# Patient Record
Sex: Male | Born: 1950 | Race: Black or African American | Hispanic: No | Marital: Married | Smoking: Never smoker
Health system: Southern US, Community
[De-identification: ages and names within clinical notes are randomized; demographics above are authoritative.]

## PROBLEM LIST (undated history)

## (undated) DIAGNOSIS — R000 Tachycardia, unspecified: Secondary | ICD-10-CM

## (undated) DIAGNOSIS — M1990 Unspecified osteoarthritis, unspecified site: Secondary | ICD-10-CM

## (undated) DIAGNOSIS — R079 Chest pain, unspecified: Secondary | ICD-10-CM

## (undated) DIAGNOSIS — R2243 Localized swelling, mass and lump, lower limb, bilateral: Secondary | ICD-10-CM

## (undated) DIAGNOSIS — E785 Hyperlipidemia, unspecified: Secondary | ICD-10-CM

## (undated) DIAGNOSIS — E119 Type 2 diabetes mellitus without complications: Secondary | ICD-10-CM

## (undated) DIAGNOSIS — K219 Gastro-esophageal reflux disease without esophagitis: Secondary | ICD-10-CM

## (undated) DIAGNOSIS — J90 Pleural effusion, not elsewhere classified: Secondary | ICD-10-CM

## (undated) DIAGNOSIS — N529 Male erectile dysfunction, unspecified: Secondary | ICD-10-CM

## (undated) DIAGNOSIS — R42 Dizziness and giddiness: Secondary | ICD-10-CM

## (undated) DIAGNOSIS — R0602 Shortness of breath: Secondary | ICD-10-CM

## (undated) DIAGNOSIS — F419 Anxiety disorder, unspecified: Secondary | ICD-10-CM

## (undated) DIAGNOSIS — I517 Cardiomegaly: Secondary | ICD-10-CM

## (undated) DIAGNOSIS — I10 Essential (primary) hypertension: Secondary | ICD-10-CM

## (undated) HISTORY — DX: Localized swelling, mass and lump, lower limb, bilateral: R22.43

## (undated) HISTORY — PX: KNEE SURGERY: SHX244

## (undated) HISTORY — DX: Shortness of breath: R06.02

## (undated) HISTORY — DX: Chest pain, unspecified: R07.9

## (undated) HISTORY — DX: Dizziness and giddiness: R42

## (undated) HISTORY — DX: Male erectile dysfunction, unspecified: N52.9

## (undated) HISTORY — DX: Type 2 diabetes mellitus without complications: E11.9

## (undated) HISTORY — DX: Hyperlipidemia, unspecified: E78.5

## (undated) HISTORY — DX: Tachycardia, unspecified: R00.0

## (undated) HISTORY — DX: Cardiomegaly: I51.7

## (undated) HISTORY — DX: Pleural effusion, not elsewhere classified: J90

---

## 1997-12-01 ENCOUNTER — Ambulatory Visit (HOSPITAL_COMMUNITY): Admission: RE | Admit: 1997-12-01 | Discharge: 1997-12-01 | Payer: Self-pay | Admitting: Gastroenterology

## 1999-05-08 ENCOUNTER — Ambulatory Visit (HOSPITAL_COMMUNITY): Admission: RE | Admit: 1999-05-08 | Discharge: 1999-05-08 | Payer: Self-pay | Admitting: Gastroenterology

## 2000-02-27 ENCOUNTER — Ambulatory Visit (HOSPITAL_COMMUNITY): Admission: RE | Admit: 2000-02-27 | Discharge: 2000-02-27 | Payer: Self-pay | Admitting: General Surgery

## 2000-02-27 ENCOUNTER — Encounter (INDEPENDENT_AMBULATORY_CARE_PROVIDER_SITE_OTHER): Payer: Self-pay

## 2001-02-24 ENCOUNTER — Emergency Department (HOSPITAL_COMMUNITY): Admission: EM | Admit: 2001-02-24 | Discharge: 2001-02-24 | Payer: Self-pay | Admitting: Emergency Medicine

## 2001-02-24 ENCOUNTER — Encounter: Payer: Self-pay | Admitting: Emergency Medicine

## 2001-03-03 ENCOUNTER — Encounter: Payer: Self-pay | Admitting: Family Medicine

## 2001-03-03 ENCOUNTER — Encounter: Admission: RE | Admit: 2001-03-03 | Discharge: 2001-03-03 | Payer: Self-pay | Admitting: Family Medicine

## 2001-06-26 ENCOUNTER — Encounter: Payer: Self-pay | Admitting: Family Medicine

## 2001-06-26 ENCOUNTER — Encounter: Admission: RE | Admit: 2001-06-26 | Discharge: 2001-06-26 | Payer: Self-pay | Admitting: Family Medicine

## 2001-08-08 ENCOUNTER — Encounter: Payer: Self-pay | Admitting: Family Medicine

## 2001-08-08 ENCOUNTER — Encounter: Admission: RE | Admit: 2001-08-08 | Discharge: 2001-08-08 | Payer: Self-pay | Admitting: Family Medicine

## 2002-05-01 ENCOUNTER — Inpatient Hospital Stay (HOSPITAL_COMMUNITY): Admission: EM | Admit: 2002-05-01 | Discharge: 2002-05-02 | Payer: Self-pay | Admitting: Emergency Medicine

## 2002-05-26 ENCOUNTER — Encounter: Admission: RE | Admit: 2002-05-26 | Discharge: 2002-08-24 | Payer: Self-pay | Admitting: Family Medicine

## 2005-11-16 ENCOUNTER — Ambulatory Visit: Payer: Self-pay | Admitting: Family Medicine

## 2006-01-22 ENCOUNTER — Ambulatory Visit: Payer: Self-pay | Admitting: Family Medicine

## 2006-03-11 ENCOUNTER — Ambulatory Visit: Payer: Self-pay | Admitting: Family Medicine

## 2006-03-11 LAB — CONVERTED CEMR LAB
BUN: 21 mg/dL (ref 6–23)
Chol/HDL Ratio, serum: 3.8
Creatinine,U: 168.5 mg/dL
GFR calc non Af Amer: 74 mL/min
Glucose, Bld: 133 mg/dL — ABNORMAL HIGH (ref 70–99)
Hgb A1c MFr Bld: 7.8 % — ABNORMAL HIGH (ref 4.6–6.0)
Microalb Creat Ratio: 49.3 mg/g — ABNORMAL HIGH (ref 0.0–30.0)
Potassium: 4.1 meq/L (ref 3.5–5.1)
VLDL: 21 mg/dL (ref 0–40)

## 2006-06-11 ENCOUNTER — Ambulatory Visit: Payer: Self-pay | Admitting: Family Medicine

## 2006-06-11 LAB — CONVERTED CEMR LAB
CO2: 27 meq/L (ref 19–32)
Creatinine, Ser: 1 mg/dL (ref 0.4–1.5)
GFR calc Af Amer: 100 mL/min
GFR calc non Af Amer: 82 mL/min
Hgb A1c MFr Bld: 7.9 % — ABNORMAL HIGH (ref 4.6–6.0)
Potassium: 4.1 meq/L (ref 3.5–5.1)
Sodium: 140 meq/L (ref 135–145)

## 2006-06-25 ENCOUNTER — Ambulatory Visit: Payer: Self-pay | Admitting: Family Medicine

## 2006-06-25 LAB — CONVERTED CEMR LAB
GFR calc Af Amer: 100 mL/min
GFR calc non Af Amer: 82 mL/min
Glucose, Bld: 131 mg/dL — ABNORMAL HIGH (ref 70–99)
Sodium: 140 meq/L (ref 135–145)

## 2006-07-30 ENCOUNTER — Ambulatory Visit: Payer: Self-pay | Admitting: Family Medicine

## 2006-07-30 LAB — CONVERTED CEMR LAB
BUN: 20 mg/dL (ref 6–23)
Calcium: 9.8 mg/dL (ref 8.4–10.5)
Chloride: 104 meq/L (ref 96–112)
Creatinine, Ser: 1.1 mg/dL (ref 0.4–1.5)
GFR calc non Af Amer: 74 mL/min
Glucose, Bld: 127 mg/dL — ABNORMAL HIGH (ref 70–99)
Sodium: 140 meq/L (ref 135–145)

## 2006-09-12 DIAGNOSIS — Z860100 Personal history of colon polyps, unspecified: Secondary | ICD-10-CM | POA: Insufficient documentation

## 2006-09-12 DIAGNOSIS — K219 Gastro-esophageal reflux disease without esophagitis: Secondary | ICD-10-CM | POA: Insufficient documentation

## 2006-10-07 ENCOUNTER — Ambulatory Visit: Payer: Self-pay | Admitting: Family Medicine

## 2006-10-07 DIAGNOSIS — F528 Other sexual dysfunction not due to a substance or known physiological condition: Secondary | ICD-10-CM

## 2006-10-08 ENCOUNTER — Encounter (INDEPENDENT_AMBULATORY_CARE_PROVIDER_SITE_OTHER): Payer: Self-pay | Admitting: Family Medicine

## 2006-10-08 ENCOUNTER — Telehealth (INDEPENDENT_AMBULATORY_CARE_PROVIDER_SITE_OTHER): Payer: Self-pay | Admitting: *Deleted

## 2006-10-08 LAB — CONVERTED CEMR LAB
AST: 30 units/L (ref 0–37)
BUN: 16 mg/dL (ref 6–23)
CO2: 32 meq/L (ref 19–32)
Chloride: 110 meq/L (ref 96–112)
GFR calc non Af Amer: 74 mL/min
HDL: 31.9 mg/dL — ABNORMAL LOW (ref >39.0)
Microalb Creat Ratio: 26 mg/g (ref 0.0–30.0)
Sodium: 144 meq/L (ref 135–145)
Total CHOL/HDL Ratio: 4
VLDL: 26 mg/dL (ref 0–40)

## 2006-10-11 ENCOUNTER — Encounter (INDEPENDENT_AMBULATORY_CARE_PROVIDER_SITE_OTHER): Payer: Self-pay | Admitting: *Deleted

## 2006-10-29 ENCOUNTER — Telehealth (INDEPENDENT_AMBULATORY_CARE_PROVIDER_SITE_OTHER): Payer: Self-pay | Admitting: *Deleted

## 2006-11-27 ENCOUNTER — Encounter (INDEPENDENT_AMBULATORY_CARE_PROVIDER_SITE_OTHER): Payer: Self-pay | Admitting: Family Medicine

## 2007-01-07 ENCOUNTER — Ambulatory Visit: Payer: Self-pay | Admitting: Family Medicine

## 2007-01-09 ENCOUNTER — Telehealth (INDEPENDENT_AMBULATORY_CARE_PROVIDER_SITE_OTHER): Payer: Self-pay | Admitting: *Deleted

## 2007-01-09 LAB — CONVERTED CEMR LAB
AST: 34 units/L (ref 0–37)
CO2: 29 meq/L (ref 19–32)
Calcium: 9.8 mg/dL (ref 8.4–10.5)
Creatinine, Ser: 1.1 mg/dL (ref 0.4–1.5)
Creatinine,U: 246.3 mg/dL
GFR calc Af Amer: 89 mL/min
GFR calc non Af Amer: 74 mL/min
Hgb A1c MFr Bld: 7.4 % — ABNORMAL HIGH (ref 4.6–6.0)
LDL Cholesterol: 88 mg/dL (ref 0–99)
Sodium: 141 meq/L (ref 135–145)
VLDL: 20 mg/dL (ref 0–40)

## 2007-02-12 ENCOUNTER — Ambulatory Visit: Payer: Self-pay | Admitting: Family Medicine

## 2007-05-05 ENCOUNTER — Ambulatory Visit: Payer: Self-pay | Admitting: Family Medicine

## 2007-05-05 DIAGNOSIS — E785 Hyperlipidemia, unspecified: Secondary | ICD-10-CM | POA: Insufficient documentation

## 2007-05-05 DIAGNOSIS — E119 Type 2 diabetes mellitus without complications: Secondary | ICD-10-CM | POA: Insufficient documentation

## 2007-05-05 DIAGNOSIS — I10 Essential (primary) hypertension: Secondary | ICD-10-CM | POA: Insufficient documentation

## 2007-05-06 ENCOUNTER — Encounter (INDEPENDENT_AMBULATORY_CARE_PROVIDER_SITE_OTHER): Payer: Self-pay | Admitting: *Deleted

## 2007-05-06 ENCOUNTER — Telehealth (INDEPENDENT_AMBULATORY_CARE_PROVIDER_SITE_OTHER): Payer: Self-pay | Admitting: *Deleted

## 2007-05-06 LAB — CONVERTED CEMR LAB
CO2: 28 meq/L (ref 19–32)
Calcium: 9.6 mg/dL (ref 8.4–10.5)
Chloride: 102 meq/L (ref 96–112)
Cholesterol: 132 mg/dL (ref 0–200)
Creatinine, Ser: 1 mg/dL (ref 0.4–1.5)
Creatinine,U: 208.4 mg/dL
GFR calc Af Amer: 99 mL/min
GFR calc non Af Amer: 82 mL/min
HDL: 34.2 mg/dL — ABNORMAL LOW (ref >39.0)
Hgb A1c MFr Bld: 7.2 % — ABNORMAL HIGH (ref 4.6–6.0)
LDL Cholesterol: 76 mg/dL (ref 0–99)
Sodium: 139 meq/L (ref 135–145)
Total CHOL/HDL Ratio: 3.9

## 2007-05-09 ENCOUNTER — Telehealth (INDEPENDENT_AMBULATORY_CARE_PROVIDER_SITE_OTHER): Payer: Self-pay | Admitting: *Deleted

## 2007-06-09 ENCOUNTER — Encounter (INDEPENDENT_AMBULATORY_CARE_PROVIDER_SITE_OTHER): Payer: Self-pay | Admitting: Family Medicine

## 2007-09-01 ENCOUNTER — Ambulatory Visit: Payer: Self-pay | Admitting: Internal Medicine

## 2007-09-05 ENCOUNTER — Telehealth (INDEPENDENT_AMBULATORY_CARE_PROVIDER_SITE_OTHER): Payer: Self-pay | Admitting: *Deleted

## 2007-09-05 LAB — CONVERTED CEMR LAB
Basophils Relative: 0.1 % (ref 0.0–1.0)
Eosinophils Absolute: 0.1 10*3/uL (ref 0.0–0.7)
HCT: 36.5 % — ABNORMAL LOW (ref 39.0–52.0)
Hgb A1c MFr Bld: 7.5 % — ABNORMAL HIGH (ref 4.6–6.0)
Iron: 35 ug/dL — ABNORMAL LOW (ref 42–165)
Monocytes Relative: 6.1 % (ref 3.0–12.0)
Neutro Abs: 4.4 10*3/uL (ref 1.4–7.7)
Neutrophils Relative %: 58.7 % (ref 43.0–77.0)
RBC: 4.6 M/uL (ref 4.22–5.81)
Saturation Ratios: 7.1 % — ABNORMAL LOW (ref 20.0–50.0)
TSH: 0.65 microintl units/mL (ref 0.35–5.50)
Transferrin: 351.8 mg/dL (ref >=212.0)

## 2007-09-19 ENCOUNTER — Telehealth (INDEPENDENT_AMBULATORY_CARE_PROVIDER_SITE_OTHER): Payer: Self-pay | Admitting: *Deleted

## 2007-09-22 ENCOUNTER — Encounter: Payer: Self-pay | Admitting: Internal Medicine

## 2007-10-28 ENCOUNTER — Encounter: Payer: Self-pay | Admitting: Internal Medicine

## 2007-11-03 ENCOUNTER — Encounter: Payer: Self-pay | Admitting: Internal Medicine

## 2007-11-05 ENCOUNTER — Encounter: Payer: Self-pay | Admitting: Internal Medicine

## 2007-11-17 ENCOUNTER — Ambulatory Visit (HOSPITAL_COMMUNITY): Admission: RE | Admit: 2007-11-17 | Discharge: 2007-11-17 | Payer: Self-pay | Admitting: Gastroenterology

## 2007-11-26 ENCOUNTER — Encounter: Payer: Self-pay | Admitting: Internal Medicine

## 2007-12-26 ENCOUNTER — Telehealth (INDEPENDENT_AMBULATORY_CARE_PROVIDER_SITE_OTHER): Payer: Self-pay | Admitting: *Deleted

## 2007-12-30 ENCOUNTER — Telehealth (INDEPENDENT_AMBULATORY_CARE_PROVIDER_SITE_OTHER): Payer: Self-pay | Admitting: *Deleted

## 2008-01-02 ENCOUNTER — Ambulatory Visit: Payer: Self-pay | Admitting: Internal Medicine

## 2008-01-02 DIAGNOSIS — D509 Iron deficiency anemia, unspecified: Secondary | ICD-10-CM

## 2008-01-02 DIAGNOSIS — J45909 Unspecified asthma, uncomplicated: Secondary | ICD-10-CM | POA: Insufficient documentation

## 2008-01-08 ENCOUNTER — Telehealth (INDEPENDENT_AMBULATORY_CARE_PROVIDER_SITE_OTHER): Payer: Self-pay | Admitting: *Deleted

## 2008-01-08 LAB — CONVERTED CEMR LAB
Calcium: 8.9 mg/dL (ref 8.4–10.5)
Glucose, Bld: 107 mg/dL — ABNORMAL HIGH (ref 70–99)
Hgb A1c MFr Bld: 7.9 % — ABNORMAL HIGH (ref 4.6–6.0)
Potassium: 3.7 meq/L (ref 3.5–5.1)

## 2008-01-21 ENCOUNTER — Telehealth: Payer: Self-pay | Admitting: Internal Medicine

## 2008-01-27 ENCOUNTER — Telehealth (INDEPENDENT_AMBULATORY_CARE_PROVIDER_SITE_OTHER): Payer: Self-pay | Admitting: *Deleted

## 2008-01-30 ENCOUNTER — Telehealth (INDEPENDENT_AMBULATORY_CARE_PROVIDER_SITE_OTHER): Payer: Self-pay | Admitting: *Deleted

## 2008-02-02 ENCOUNTER — Telehealth (INDEPENDENT_AMBULATORY_CARE_PROVIDER_SITE_OTHER): Payer: Self-pay | Admitting: *Deleted

## 2008-02-02 ENCOUNTER — Encounter: Admission: RE | Admit: 2008-02-02 | Discharge: 2008-02-02 | Payer: Self-pay | Admitting: Surgery

## 2008-02-11 ENCOUNTER — Encounter: Payer: Self-pay | Admitting: Internal Medicine

## 2008-03-15 ENCOUNTER — Telehealth (INDEPENDENT_AMBULATORY_CARE_PROVIDER_SITE_OTHER): Payer: Self-pay | Admitting: *Deleted

## 2008-04-13 ENCOUNTER — Telehealth: Payer: Self-pay | Admitting: Internal Medicine

## 2008-04-19 ENCOUNTER — Telehealth (INDEPENDENT_AMBULATORY_CARE_PROVIDER_SITE_OTHER): Payer: Self-pay | Admitting: *Deleted

## 2008-05-11 ENCOUNTER — Ambulatory Visit: Payer: Self-pay | Admitting: Internal Medicine

## 2008-05-11 DIAGNOSIS — L989 Disorder of the skin and subcutaneous tissue, unspecified: Secondary | ICD-10-CM | POA: Insufficient documentation

## 2008-05-13 ENCOUNTER — Telehealth (INDEPENDENT_AMBULATORY_CARE_PROVIDER_SITE_OTHER): Payer: Self-pay | Admitting: *Deleted

## 2008-05-14 ENCOUNTER — Telehealth (INDEPENDENT_AMBULATORY_CARE_PROVIDER_SITE_OTHER): Payer: Self-pay | Admitting: *Deleted

## 2008-05-24 ENCOUNTER — Encounter: Payer: Self-pay | Admitting: Internal Medicine

## 2008-06-04 ENCOUNTER — Encounter: Payer: Self-pay | Admitting: Internal Medicine

## 2008-06-14 ENCOUNTER — Encounter: Payer: Self-pay | Admitting: Internal Medicine

## 2008-07-12 ENCOUNTER — Encounter (INDEPENDENT_AMBULATORY_CARE_PROVIDER_SITE_OTHER): Payer: Self-pay | Admitting: *Deleted

## 2008-07-21 ENCOUNTER — Telehealth (INDEPENDENT_AMBULATORY_CARE_PROVIDER_SITE_OTHER): Payer: Self-pay | Admitting: *Deleted

## 2008-11-08 ENCOUNTER — Encounter: Admission: RE | Admit: 2008-11-08 | Discharge: 2009-02-06 | Payer: Self-pay | Admitting: Family Medicine

## 2009-11-14 ENCOUNTER — Observation Stay (HOSPITAL_COMMUNITY): Admission: EM | Admit: 2009-11-14 | Discharge: 2009-11-14 | Payer: Self-pay | Admitting: Emergency Medicine

## 2010-04-30 LAB — CONVERTED CEMR LAB
BUN: 13 mg/dL (ref 6–23)
CO2: 28 meq/L (ref 19–32)
Calcium: 9.5 mg/dL (ref 8.4–10.5)
Cholesterol: 126 mg/dL (ref 0–200)
GFR calc non Af Amer: 92 mL/min
Iron: 67 ug/dL (ref 42–165)
LDL Cholesterol: 69 mg/dL (ref 0–99)
PSA: 1.38 ng/mL (ref 0.10–4.00)
Transferrin: 326.6 mg/dL (ref >=212.0)
Triglycerides: 125 mg/dL (ref 0–149)
VLDL: 25 mg/dL (ref 0–40)

## 2010-06-16 LAB — DIFFERENTIAL
Basophils Absolute: 0 10*3/uL (ref 0.0–0.1)
Neutro Abs: 6.2 10*3/uL (ref 1.7–7.7)

## 2010-06-16 LAB — GLUCOSE, CAPILLARY
Glucose-Capillary: 192 mg/dL — ABNORMAL HIGH (ref 70–99)
Glucose-Capillary: 255 mg/dL — ABNORMAL HIGH (ref 70–99)
Glucose-Capillary: 283 mg/dL — ABNORMAL HIGH (ref 70–99)
Glucose-Capillary: 385 mg/dL — ABNORMAL HIGH (ref 70–99)

## 2010-06-16 LAB — POCT I-STAT 3, VENOUS BLOOD GAS (G3P V)
Acid-base deficit: 5 mmol/L — ABNORMAL HIGH (ref 0.0–2.0)
O2 Saturation: 74 %
TCO2: 21 mmol/L (ref 0–100)
pH, Ven: 7.33 — ABNORMAL HIGH (ref 7.250–7.300)
pO2, Ven: 42 mmHg (ref 30.0–45.0)

## 2010-06-16 LAB — POCT I-STAT, CHEM 8
BUN: 13 mg/dL (ref 6–23)
Chloride: 101 mEq/L (ref 96–112)
Creatinine, Ser: 0.9 mg/dL (ref 0.4–1.5)
Glucose, Bld: 478 mg/dL — ABNORMAL HIGH (ref 70–99)
HCT: 53 % — ABNORMAL HIGH (ref 39.0–52.0)
Hemoglobin: 14.3 g/dL (ref 13.0–17.0)
Hemoglobin: 18 g/dL — ABNORMAL HIGH (ref 13.0–17.0)
Sodium: 132 mEq/L — ABNORMAL LOW (ref 135–145)
TCO2: 20 mmol/L (ref 0–100)
TCO2: 20 mmol/L (ref 0–100)

## 2010-06-16 LAB — URINALYSIS, ROUTINE W REFLEX MICROSCOPIC
Glucose, UA: >1000 mg/dL — AB
Hgb urine dipstick: NEGATIVE
Ketones, ur: >80 mg/dL — AB
Leukocytes, UA: NEGATIVE
Nitrite: NEGATIVE
Protein, ur: NEGATIVE mg/dL
Urobilinogen, UA: 0.2 mg/dL (ref 0.0–1.0)
pH: 5 (ref 5.0–8.0)

## 2010-06-16 LAB — CBC
HCT: 47.3 % (ref 39.0–52.0)
Hemoglobin: 16 g/dL (ref 13.0–17.0)
MCHC: 33.8 g/dL (ref 30.0–36.0)
MCV: 80.9 fL (ref 78.0–100.0)
Platelets: 291 10*3/uL (ref 150–400)
RBC: 5.85 MIL/uL — ABNORMAL HIGH (ref 4.22–5.81)
WBC: 8.4 10*3/uL (ref 4.0–10.5)

## 2010-06-16 LAB — URINE MICROSCOPIC-ADD ON

## 2010-08-15 NOTE — Op Note (Signed)
Raymond Munoz, Raymond Munoz               ACCOUNT NO.:  [id number]   MEDICAL RECORD NO.:  [id number]          PATIENT TYPE:  AMB   LOCATION:  ENDO                         FACILITY:  [facility]   PHYSICIAN:  Bernette Redbird, M.D.   DATE OF BIRTH:  [birth year]   DATE OF PROCEDURE:  11/17/2007  DATE OF DISCHARGE:  11/17/2007                               OPERATIVE REPORT   PROCEDURE:  Small bowel capsule endoscopy.   INDICATION:  Recurrent Hemoccult positive stool in a patient with modest  findings on his upper endoscopy (slight gastritis) and several colonic  polyps on a recent colonoscopy, but no definite source of persistent  heme positivity seen.   FINDINGS:  Normal study.   DESCRIPTION OF PROCEDURE:  The capsule was swallowed after coming as an  outpatient to the [facility] and monitoring was  performed for 4 hours.   FINDINGS:  Transit time was quite rapid.  The duodenum was reached in 4  minutes, and it appears the cecum was reached in 3 hours.   This was a normal study.  No mucosal abnormalities were seen,  specifically no source of heme positivity was identified.  No  inflammation, masses, ulcerations or vascular ectasia were noted.   IMPRESSION:  Normal small bowel capsule endoscopy.   PLAN:  No further GI workup needed at this time.           ______________________________  Bernette Redbird, M.D.     RB/MEDQ  D:  11/27/2007  T:  11/27/2007  Job:  [id number]   cc:   Willow Ora, MD  [phone] [address] [place], [place] [address]

## 2010-08-18 NOTE — Assessment & Plan Note (Signed)
Raymond Munoz                        [facility] OFFICE NOTE   NAME:Raymond Munoz, Raymond Munoz                        MRN:          [id number]  DATE:07/30/2006                            DOB:          [birth year]    REASON FOR VISIT:  Follow up blood pressure and diabetes.   Raymond Munoz is a 60 year old male who I saw last month for followup of  diabetes hypertension.  His Lantus was increased to 45 units daily as  well as provided hydrochlorothiazide 25 mg daily.  He reports he is  doing well on the Lantus and has not had any side effects.  His blood  sugar in the morning is averaging 130-135.  He has not had any side  effects from the hydrochlorothiazide.   The patient also reports that he has been having trouble with not  keeping and getting an erection.  This has been present for a while.   MEDICATIONS:  Please see medication list.   ALLERGIES:  1. CODEINE.  2. IBUPROFEN.  3. ASPIRIN.   OBJECTIVE:  Weight 208.2, temperature 97.8, pulse 68, blood pressure  130/90.  GENERAL:  Pleasant male in no acute distress.  NECK:  Supple, no lymphadenopathy.  LUNGS:  Clear.  HEART:  Regular rate and rhythm, no murmurs, gallops or rubs.  EXTREMITIES:  No cyanosis, clubbing or edema.   IMPRESSION:  1. Type 2 diabetes with last hemoglobin A1c of 7.9.  2. Hypertension, improved, but borderline.  3. Hyperlipidemia.  4. Erectile dysfunction.   PLAN:  1. In regards to his hypertension we will continue hydrochlorothiazide      and Cozaar.  I advised the patient to discontinue drinking Red Bull      and we will recheck his blood pressure in 2 months.  The patient's      wife will fax his blood pressure readings over the next 2-3 weeks.      If they are still elevated I will make some adjustments.  We will      also check a BMP today.  2. In regards to his diabetes, we will increase his Lantus to 50      units.  The patient and his wife were advised to increase  Lantus 5      units every fifth day if blood sugars range above 120.  After blood      sugar in the teens they are to call the office so I can make      adjustment.  3. In regards to erectile dysfunction, I have discussed the      pathophysiology and the risk factors including diabetes,      hypertension and hyperlipidemia.  We will start the patient on      Viagra 50 mg to 100 mg daily p.r.n.  Side effects were reviewed      both with patient and wife.      He was provided a sample pack.  4. The patient to follow up, as stated, in 2 months.     Leanne Chang, M.D.  Electronically Signed    LA/MedQ  DD: 07/30/2006  DT: 07/30/2006  Job #: [id number]

## 2010-08-18 NOTE — Procedures (Signed)
[place]. [facility]  Patient:    Raymond Munoz                      MRN: [id number] Proc. Date: 05/08/99 Adm. Date:  [id number] Attending:  Rich Brave CC:         Redmond Baseman, M.D.                           Procedure Report  PROCEDURE:  Colonoscopy with biopsies.  ENDOSCOPIST:  Florencia Reasons, M.D.  INDICATION:  [age]-year-old with heme-positive stool through his primary physicians office, now approximately a year and a half status post removal of a  small polyp from the transverse colon and with a prior history of small adenomas.  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  DESCRIPTION OF PROCEDURE:  The nature, purpose and risks of the procedure were familiar to the patient from prior examinations and he provided written consent. Sedation for this procedure and the upper endoscopy which preceded it totaled Fentanyl 50 mcg and Versed 5 mg IV, without arrhythmias or desaturation. Digital exam of the prostate was unremarkable.  The Olympus video colonoscope was advanced to the cecum without difficulty.  There was clear visualization of the appendiceal orifice.  Pullback was then performed.  There was a small-to-moderate amount of semiformed stool scattered throughout the length of the colon, but primarily in the proximal colon.  This could be irrigated out of the way successfully and is not felt to have obscured any significant lesions.  With that proviso, this was basically a normal exam except for some erythema and slight loss of vascularity in the rectum and distal sigmoid.  This is of questionable clinical significance and is thought to possibly be due to the patients prep; in any event, multiple biopsies were obtained from the rectosigmoid area prior to removal of the scope.  The remainder of the exam was unremarkable, specifically without evidence of polyps, cancer, diffuse colitis, vascular  malformations or diverticular disease.  The patient tolerated the procedure well and there were no apparent complications.  IMPRESSION: 1. Possible mild distal proctocolitis, as described above, pathology pending. 2. No definite source of heme-positive stool identified, although conceivably the    proctosigmoiditis, if real rather than artifactual from the prep, could explain    this. 3. No evidence of recurrent polyps.  PLAN:  Followup colonoscopy in three to five years, in view of the prior history of adenomatous polyps.  We will await pathology on the current biopsies of the rectosigmoid region and consider treatment for inflammation, if it is documented to be present. DD:  05/08/99 TD:  05/08/99 Job: [id number]

## 2010-08-18 NOTE — Assessment & Plan Note (Signed)
Raymond Munoz                        [facility] OFFICE NOTE   NAME:Raymond Munoz, Raymond Munoz                        MRN:          [id number]  DATE:06/11/2006                            DOB:          [birth year]    REASON FOR VISIT:  Diabetic check-up.   Raymond Munoz presents today for his diabetic check-up.  Unfortunately, they  were not able to send in the blood pressure readings as recommend  the  last visit.  The patient has otherwise been doing well and has been  taking his medications regularly.  He takes Lantus 35 units nightly as  well as metformin 850 mg b.i.d.  He has not had any side effects from  the medication.   The patient also complains about lower back pain intermittently.  He  does report that his job is very physical.  He denies any radiation down  the legs.  Denies any bowel or bladder dysfunction.  He states that it  is usually uncomfortable when he stands up after sitting.  Currently he  is asymptomatic.   MEDICATIONS:  1. Lantus 35 units nightly.  2. Fish oil capsules 2 tablets b.i.d.  3. B6 100 mg daily.  4. Pulmicort p.r.n.  5. Easy-Lax stool softener daily.  6. Aspirin 81 mg daily.  7. Cozaar 100 mg daily.  8. Zetia 10 mg daily.  9. Metformin 850 mg t.i.d.  10.Gemfibrozil 600 mg t.i.d.  11.Over-the-counter Prilosec.  12.Tylenol p.r.n.  13.Vitamin C 500 mg daily.   ALLERGIES:  IBUPROFEN.   OBJECTIVE:  Weight 208.6, pulse 78, blood pressure 146/96.  GENERAL:  A pleasant male in acute distress. Answers questions  appropriately.  Alert and oriented x3.  NECK:  Supple, no lymphadenopathy, carotid bruits or JVD.  LUNGS:  Clear.  HEART:  Regular rate and rhythm, normal S1, S2. No murmurs, gallops or  rubs.  EXTREMITIES:  No cyanosis, clubbing or edema, sensation intact.  LOW BACK:  Significant for no midline tenderness, no paraspinal  tenderness.  He has full range of motion.  Deep tendon reflexes were plus and equal  bilaterally.  Strength was 5/5,  no focal sensory/motor deficits were noted.   IMPRESSION:  1. Type 2 diabetes with last hemoglobin A1c of 7.8.  2. Hypertension.  3. Hyperlipidemia, stable.  4. Gastroesophageal reflux.   PLAN:  1. In regards to his type 2 diabetes we will increase his Lantus to 40      units.  I advised that if blood sugar is still greater than 100      after 5 days, they are to increase his Lantus an additional 5 units      and call me in 2 weeks with the readings.  2. We will continue Glucophage 850 mg b.i.d.  3. We will check a BMET.  4. In regards to his blood pressure, we will add hydrochlorothiazide      12.5 mg daily x1 week and increase to 25 mg daily thereafter.  We      will have a BMET checked in 2 weeks and have the patient follow up  in 1 month.  5. Initially recommended an x-ray of his lower back.  The patient      reports that his symptoms have improved and he would like to      monitor.  He will let us know if his symptoms recur.     Leanne Chang, M.D.  Electronically Signed    LA/MedQ  DD: 06/11/2006  DT: 06/11/2006  Job #: [id number]

## 2010-08-18 NOTE — Procedures (Signed)
[place]. [facility]  Patient:    Raymond Munoz                      MRN: [id number] Proc. Date: 05/08/99 Adm. Date:  [id number] Attending:  Rich Brave CC:         Redmond Baseman, M.D.                           Procedure Report  PROCEDURE:  Upper endoscopy with biopsies.  SURGEON:  Florencia Reasons, M.D.  INDICATIONS:  A 60 year old gentleman with hemoccult positive stool, who, approximately a year earlier had endoscopic evaluation where gastric biopsy showed evidence of intestinal metaplasia.  FINDINGS:  No evidence of ulceration or mass.  Multiple gastric biopsies obtained.  PROCEDURE:  The nature, purpose and risks of the procedure were familiar to the  patient from prior examination and he provided written consent.  Sedation for this procedure and the colonoscopy, which followed it totalled fentanyl 50 mcg and Versed 5 mg IV without arrhythmias or desaturation.  The Olympus video small caliber adult video endoscope was passed easily under direct vision, entering the esophagus without difficulty.  The esophagus was endoscopically normal, without evidence of reflux esophagitis, varices, infection or neoplasia.  No ring, stricture or significant hiatal hernia was appreciated.  The stomach was entered.  It contained no significant residual.  The gastric mucosa was perhaps slightly erythematous, but not strikingly inflamed.  Retroflexed view into the proximal stomach was unremarkable.  The pylorus, duodenal bulb and second duodenum looked normal.  Multiple biopsies were obtained along the length of the stomach, primarily in the antrum, prior to removal of the scope.  The patient tolerated the procedure well and there were no apparent complications.  IMPRESSION: 1. Possible mild, chronic gastritis. 2. No source of heme positive stool identified. 3. Biopsies obtained to look for any progression of the patients metaplasia   towards dysplasia.  PLAN:  Await pathology on the biopsies and proceed to colonoscopic evaluation. DD:  05/08/99 TD:  05/08/99 Job: [id number]

## 2010-08-18 NOTE — Op Note (Signed)
[facility]  Patient:    SANTHOSH, GULINO                        MRN: [id number] Proc. Date: 02/27/00 Attending:  Sheppard Plumber. Earlene Plater, M.D.                           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  OPERATION:  Examination under anesthesia, proctoscopy and fistulotomy.  SURGEON:  Timothy E. Earlene Plater, M.D.  ANESTHESIA:  General.  INDICATIONS:  Mr. 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  On examination in the office a internal external fistula was demonstrated in the posterior left posterior position.  I have carefully explained to him and his wife the implications anatomy that the expected outcomes and the possible complications and they agree to proceed.  DESCRIPTION OF PROCEDURE:  The patient was brought to the operating room, placed supine and general endotracheal anesthesia administered.  He was placed in lithotomy.  Perianal area inspected, prepped and draped in the usual fashion.  With the anoscope in place, the fistula was easily seen.  It was approximately 3 cm outside the anal verge and it probed straight through into the posterior anoderm with a significant pustular discharge.  There was considerable loose stool in the rectum.  I attempted to do a proctoscopy, but there was so much liquid stool I really could not see, and the general appearance of the mucosal was normal.  The scope withdrawn and anoscope reinserted, then I carefully debrided the fistulous tract and did go beneath the internal sphincter, which was carefully divided.  ______ and ______ were debrided from the fistula, and then care was taken to avoid the external sphincter.  Fragments of tissue were sent for pathology and the fistula tract was  cauterized.  It was dry and the procedure was completed.  Gelfoam gauze and dry sterile dressings were applied.  Written and verbal instructions were given to him and his wife including Percocet #30 and he will be seen and followed as an outpatient. DD:  02/27/00 TD:  02/27/00 Job: [id number]

## 2010-08-18 NOTE — Discharge Summary (Signed)
Raymond Munoz, REDD NO.:  [id number]   MEDICAL RECORD NO.:  [id number]                   PATIENT TYPE:  INP   LOCATION:  0344                                 FACILITY:  [facility]   PHYSICIAN:  Ermalene Searing. Leander Rams, M.D.                DATE OF BIRTH:  01-Nov-1950   DATE OF ADMISSION:  05/01/2002  DATE OF DISCHARGE:                                 DISCHARGE SUMMARY   DISCHARGE DIAGNOSES:  Symptomatic hypoglycemia without ketoacidosis.   SECONDARY DIAGNOSIS:  1. Hypertriglyceridemia.  2. Asthma.  3. Gastroesophageal reflux disease.  4. Obesity.  5. History of colonic polyp removal.  6. Chronic back pain.  7. Status post left knee surgery (1980).   HISTORY OF PRESENT ILLNESS:  The patient is a 60 year old African-American  male who presents to Dr. Nash Dimmer clinic complaining of weakness and dizziness  for the past few weeks. The patient also had been complaining of polyuria,  polydipsia, weight loss of about 20 pounds over the past three months. At  Dr. Nash Dimmer office, the patient was found to have a blood glucose of 400 with  3+ ketones in his urine. Otherwise, the patient did not have any complaint  such as chest pain or shortness of breath.   SOCIAL HISTORY:  The patient lives with his wife and two children. Quit  smoking in 1998. No alcohol consumption.   PHYSICAL EXAMINATION:  VITAL SIGNS: All stable without any orthostatic  changes.  GENERAL: No acute distress.  CARDIAC: Regular rate and rhythm. No murmur, rub, or gallop.  LUNGS: Clear to auscultation and percussion bilaterally.  ABDOMEN: Benign.  EXTREMITIES: No edema.   LABORATORY DATA:  On admission, bicarb 22, otherwise normal CBC and  electrolytes. Hemoglobin A1C 12.3.   HOSPITAL COURSE:  The patient was admitted to the medicine floor for  management of symptomatic hyperglycemia. The patient was started on night  time NPH insulin of 12 units with insulin sliding scale. The patient stated  that he had not been taking Glucophage over the past month. This was  restarted during this hospitalization at 500 mg by mouth twice a day with  meals. The patient was also treated with IV fluids 200 cc per hour for 10  hours. On hospital day two, the patient felt much better and was felt to be  ready to be discharged. The patient was instructed for the use of insulin  injection technique, which he is going to be using for the next 10 days. The  dosage will be 16 units.   CONDITION ON DISCHARGE:  Stable.   DISCHARGE MEDICATIONS:  1. Insulin NPH 16 units subcutaneous at bedtime.  2. Glucophage 500 mg by mouth twice a day (the patient was instructed to     increased to 1,000 mg by mouth twice a day in one week).  3. Niaspan 75 mg by mouth at bedtime.  4. Aspirin  81 mg by mouth each day.  5. Allegra 180 mg by mouth each day.  6. Pulmicort MDI each day.  7. Prilosec 20 mg by mouth each day.   DISCHARGE INSTRUCTIONS:  The patient was instructed to check his blood sugar  in the morning before breakfast. The patient was instructed to call Dr.  Nash Dimmer office for blood sugar less than 80 or for over 300.   FOLLOW UP:  Please consider ace inhibitor and hypercholesterolemia agent  other than Niaspan (such as statin) should be considered as an outpatient  for long term prevention of complications.                                               Ermalene Searing. Leander Rams, M.D.    MSR/MEDQ  D:  05/02/2002  T:  05/02/2002  Job:  [id number]   cc:   Thelma Barge P. Modesto Charon, M.D.  [address]  [place]  [place] [address]  Fax: [phone]

## 2014-03-07 ENCOUNTER — Encounter (HOSPITAL_COMMUNITY): Payer: Self-pay | Admitting: Emergency Medicine

## 2014-03-07 ENCOUNTER — Emergency Department (HOSPITAL_COMMUNITY): Payer: 59

## 2014-03-07 ENCOUNTER — Emergency Department (HOSPITAL_COMMUNITY)
Admission: EM | Admit: 2014-03-07 | Discharge: 2014-03-07 | Disposition: A | Discharge status: Home / Self Care | Payer: 59 | Attending: Emergency Medicine | Admitting: Emergency Medicine

## 2014-03-07 DIAGNOSIS — G629 Polyneuropathy, unspecified: Secondary | ICD-10-CM | POA: Diagnosis not present

## 2014-03-07 DIAGNOSIS — R1011 Right upper quadrant pain: Secondary | ICD-10-CM | POA: Diagnosis not present

## 2014-03-07 DIAGNOSIS — E119 Type 2 diabetes mellitus without complications: Secondary | ICD-10-CM | POA: Diagnosis not present

## 2014-03-07 DIAGNOSIS — Z7982 Long term (current) use of aspirin: Secondary | ICD-10-CM | POA: Diagnosis not present

## 2014-03-07 DIAGNOSIS — R197 Diarrhea, unspecified: Secondary | ICD-10-CM | POA: Insufficient documentation

## 2014-03-07 DIAGNOSIS — R1031 Right lower quadrant pain: Secondary | ICD-10-CM | POA: Insufficient documentation

## 2014-03-07 DIAGNOSIS — R109 Unspecified abdominal pain: Secondary | ICD-10-CM | POA: Diagnosis present

## 2014-03-07 DIAGNOSIS — M549 Dorsalgia, unspecified: Secondary | ICD-10-CM

## 2014-03-07 DIAGNOSIS — Z79899 Other long term (current) drug therapy: Secondary | ICD-10-CM | POA: Insufficient documentation

## 2014-03-07 DIAGNOSIS — M1990 Unspecified osteoarthritis, unspecified site: Secondary | ICD-10-CM | POA: Insufficient documentation

## 2014-03-07 DIAGNOSIS — K219 Gastro-esophageal reflux disease without esophagitis: Secondary | ICD-10-CM | POA: Insufficient documentation

## 2014-03-07 DIAGNOSIS — M545 Low back pain: Secondary | ICD-10-CM | POA: Diagnosis not present

## 2014-03-07 HISTORY — DX: Gastro-esophageal reflux disease without esophagitis: K21.9

## 2014-03-07 HISTORY — DX: Unspecified osteoarthritis, unspecified site: M19.90

## 2014-03-07 HISTORY — DX: Type 2 diabetes mellitus without complications: E11.9

## 2014-03-07 MED ORDER — HYDROCODONE-ACETAMINOPHEN 5-325 MG PO TABS: 1.0000 | ORAL_TABLET | Freq: Four times a day (QID) | ORAL | Status: DC | PRN

## 2014-03-07 NOTE — ED Provider Notes (Signed)
CSN: [id number]     Arrival date & time 03/07/14  [id number] History   First MD Initiated Contact with Patient 03/07/14 [phone]     Chief Complaint  Patient presents with  . Abdominal Pain  . Diarrhea     (Consider location/radiation/quality/duration/timing/severity/associated sxs/prior Treatment) HPI [name] is a 63 year old male with past medical history of diabetes, GERD who presents the ER with 3 weeks of right-sided abdominal and back pain. Patient states his symptoms began gradually, and have persisted over the past 3 weeks. Patient states last week he saw his PCP who prescribed him gabapentin for this same pain. Patient states on Wednesday he contacted his PCP and advised him to gabapentin was not helping, and his PCP increase the dose of gabapentin. Patient states since the increase in dose he has had some intermittent diarrhea. Patient states his pain is aggravated with lying on that side, and exacerbated with palpation to anywhere on the right side of his abdomen or right side of his lumbar back. He describes the pain as a "pins and needles" sensation, and states there are no alleviating factors. Patient denies associated nausea, vomiting, chest pain, shortness of breath, dizziness, weakness, blurred vision, fever, back pain, history of cancer, IV drug use, saddle anesthesia, bowel/bladder retention/incontinence.  Past Medical History  Diagnosis Date  . Diabetes mellitus without complication   . GERD (gastroesophageal reflux disease)   . Arthritis    History reviewed. No pertinent past surgical history. History reviewed. No pertinent family history. History  Substance Use Topics  . Smoking status: Never Smoker   . Smokeless tobacco: Not on file  . Alcohol Use: No    Review of Systems  Constitutional: Negative for fever.  HENT: Negative for trouble swallowing and voice change.   Eyes: Negative for visual disturbance.  Respiratory: Negative for shortness of breath.     Cardiovascular: Negative for chest pain.  Gastrointestinal: Positive for abdominal pain and diarrhea. Negative for nausea and vomiting.  Genitourinary: Negative for dysuria.  Musculoskeletal: Positive for back pain. Negative for neck pain and neck stiffness.  Skin: Negative for rash.  Neurological: Negative for dizziness, syncope, weakness, numbness and headaches.  Psychiatric/Behavioral: Negative.       Allergies  Aspirin; Codeine; and Ibuprofen  Home Medications   Prior to Admission medications   Medication Sig Start Date End Date Taking? Authorizing Provider  acetaminophen (TYLENOL) 650 MG CR tablet Take 1,300 mg by mouth every 8 (eight) hours as needed for pain.   Yes Historical Provider, MD  aspirin EC 81 MG tablet Take 81 mg by mouth daily.   Yes Historical Provider, MD  gabapentin (NEURONTIN) 300 MG capsule Take 600 mg by mouth at bedtime.   Yes Historical Provider, MD  gemfibrozil (LOPID) 600 MG tablet Take 600 mg by mouth 2 (two) times daily before a meal.  01/23/14  Yes Historical Provider, MD  hydrochlorothiazide (HYDRODIURIL) 25 MG tablet Take 25 mg by mouth daily.  02/26/14  Yes Historical Provider, MD  LANTUS SOLOSTAR 100 UNIT/ML Solostar Pen Inject 70 Units into the skin at bedtime.  02/02/14  Yes Historical Provider, MD  metFORMIN (GLUCOPHAGE) 1000 MG tablet Take 1,000 mg by mouth daily with breakfast.  02/26/14  Yes Historical Provider, MD  olmesartan (BENICAR) 20 MG tablet Take 20 mg by mouth daily.   Yes Historical Provider, MD  Omega 3 1200 MG CAPS Take 2,400 mg by mouth 2 (two) times daily.   Yes Historical Provider, MD  omeprazole (PRILOSEC OTC)  20 MG tablet Take 20 mg by mouth daily.   Yes Historical Provider, MD  sitaGLIPtin-metformin (JANUMET) 50-1000 MG per tablet Take 1 tablet by mouth at bedtime.   Yes Historical Provider, MD  vitamin B-12 (CYANOCOBALAMIN) 1000 MCG tablet Take 1,000 mcg by mouth daily.   Yes Historical Provider, MD  vitamin C (ASCORBIC ACID)  500 MG tablet Take 500 mg by mouth daily.   Yes Historical Provider, MD  vitamin E 400 UNIT capsule Take 400 Units by mouth daily.   Yes Historical Provider, MD  HYDROcodone-acetaminophen (NORCO/VICODIN) 5-325 MG per tablet Take 1-2 tablets by mouth every 6 (six) hours as needed for moderate pain or severe pain. 03/07/14   Raymond FantasiaJoseph W Clementina Mareno, PA-C   BP 157/91 mmHg  Pulse 100  Temp(Src) 98 F (36.7 C) (Oral)  Resp 16  SpO2 100% Physical Exam  Constitutional: He is oriented to person, place, and time. He appears well-developed and well-nourished. No distress.  HENT:  Head: Normocephalic and atraumatic.  Mouth/Throat: Oropharynx is clear and moist. No oropharyngeal exudate.  Eyes: Right eye exhibits no discharge. Left eye exhibits no discharge. No scleral icterus.  Neck: Normal range of motion.  Cardiovascular: Normal rate, regular rhythm and normal heart sounds.   No murmur heard. Pulmonary/Chest: Effort normal and breath sounds normal. No respiratory distress.  Abdominal: Soft. Normal appearance and bowel sounds are normal. There is tenderness in the right upper quadrant and right lower quadrant. There is no rigidity, no guarding, no CVA tenderness, no tenderness at McBurney's point and negative Murphy's sign.    Diffuse tenderness to light and deep palpation of entire right side of abdomen, right flank.  Musculoskeletal: Normal range of motion. He exhibits no edema or tenderness.       Back:  Diffuse tenderness to light palpation of entire right side of lateral lumbar back region.  Neurological: He is alert and oriented to person, place, and time. He has normal strength. No cranial nerve deficit or sensory deficit. Coordination normal. GCS eye subscore is 4. GCS verbal subscore is 5. GCS motor subscore is 6.  Patient fully alert answering questions appropriately in full, clear sentences. Cranial nerves II through XII grossly intact. Motor strength 5 out of 5 in all major muscle groups of  upper and lower extremities. Distal sensation intact.  Skin: Skin is warm and dry. No rash noted. He is not diaphoretic.  Psychiatric: He has a normal mood and affect.  Nursing note and vitals reviewed.   ED Course  Procedures (including critical care time) Labs Review Labs Reviewed - No data to display  Imaging Review Dg Lumbar Spine Complete  03/07/2014   CLINICAL DATA:  63 year old male with low right-sided back pain and right lower abdominal pain which migrates into the right leg down to the toes for the past 3 weeks.  EXAM: LUMBAR SPINE - COMPLETE 4+ VIEW  COMPARISON:  No priors.  FINDINGS: Five views of the lumbar spine demonstrate no acute displaced fracture or definite compression type fracture. There is multilevel degenerative disc disease and facet arthropathy. No defects of the pars interarticularis are noted.  IMPRESSION: 1. No acute radiographic abnormality of the lumbar spine. 2. Multilevel degenerative disc disease and lumbar spondylosis.   Electronically Signed   By: Trudie Reedaniel  Entrikin M.D.   On: 03/07/2014 10:56     EKG Interpretation None      MDM   Final diagnoses:  Right sided abdominal pain  Right-sided back pain  Neuropathy  Patient here with right-sided abdominal pain which is diffuse along with right-sided lumbar back pain. Patient's pain is a pins and needle sensation, and extremely tender to any light palpation. Patient describes his pain as being superficial, and painful to touch the skin. No point tenderness in patient's back, no spinous process tenderness. No point tenderness in patient's abdomen. Patient's symptoms are a neuropathic pain in a dermatomal distribution consistent with a most likely herpes zoster. No concern for acute abdomen. Neuro exam benign, no concern for cauda equina or neurologic damage. Will follow-up with radiographs of lumbar spine to rule out obvious osseous spinal pathology causing the patient's neuropathic pain.  Patient's  radiographs unremarkable for any acute pathology. We will discharge patient with symptomatic therapy and pain management. I strongly encouraged patient to follow up with his primary care physician regarding this pain which resembles a zoster infection or outbreak. I discussed return precautions with patient and encouraged him to call or return to ER should he have any questions or concerns. Patient was agreeable to this plan.  BP 157/91 mmHg  Pulse 100  Temp(Src) 98 F (36.7 C) (Oral)  Resp 16  SpO2 100%  Signed,  Ladona Mow, PA-C 11:36 AM  Patient discussed with Dr. Jaci Carrel, MD   Raymond Fantasia, PA-C 03/07/14 1136  Gilda Crease, MD 03/07/14 1140

## 2014-03-07 NOTE — Discharge Instructions (Signed)
Follow-up with your primary care doctor. Return to the ER with any severe pain, numbness between your legs, all or bladder incontinence, weakness, or high fever.  Shingles Shingles (herpes zoster) is an infection that is caused by the same virus that causes chickenpox (varicella). The infection causes a painful skin rash and fluid-filled blisters, which eventually break open, crust over, and heal. It may occur in any area of the body, but it usually affects only one side of the body or face. The pain of shingles usually lasts about 1 month. However, some people with shingles may develop long-term (chronic) pain in the affected area of the body. Shingles often occurs many years after the person had chickenpox. It is more common:  In people older than 50 years.  In people with weakened immune systems, such as those with HIV, AIDS, or cancer.  In people taking medicines that weaken the immune system, such as transplant medicines.  In people under great stress. CAUSES  Shingles is caused by the varicella zoster virus (VZV), which also causes chickenpox. After a person is infected with the virus, it can remain in the person's body for years in an inactive state (dormant). To cause shingles, the virus reactivates and breaks out as an infection in a nerve root. The virus can be spread from person to person (contagious) through contact with open blisters of the shingles rash. It will only spread to people who have not had chickenpox. When these people are exposed to the virus, they may develop chickenpox. They will not develop shingles. Once the blisters scab over, the person is no longer contagious and cannot spread the virus to others. SIGNS AND SYMPTOMS  Shingles shows up in stages. The initial symptoms may be pain, itching, and tingling in an area of the skin. This pain is usually described as burning, stabbing, or throbbing.In a few days or weeks, a painful red rash will appear in the area where the  pain, itching, and tingling were felt. The rash is usually on one side of the body in a band or belt-like pattern. Then, the rash usually turns into fluid-filled blisters. They will scab over and dry up in approximately 2-3 weeks. Flu-like symptoms may also occur with the initial symptoms, the rash, or the blisters. These may include:  Fever.  Chills.  Headache.  Upset stomach. DIAGNOSIS  Your health care provider will perform a skin exam to diagnose shingles. Skin scrapings or fluid samples may also be taken from the blisters. This sample will be examined under a microscope or sent to a lab for further testing. TREATMENT  There is no specific cure for shingles. Your health care provider will likely prescribe medicines to help you manage the pain, recover faster, and avoid long-term problems. This may include antiviral drugs, anti-inflammatory drugs, and pain medicines. HOME CARE INSTRUCTIONS   Take a cool bath or apply cool compresses to the area of the rash or blisters as directed. This may help with the pain and itching.   Take medicines only as directed by your health care provider.   Rest as directed by your health care provider.  Keep your rash and blisters clean with mild soap and cool water or as directed by your health care provider.  Do not pick your blisters or scratch your rash. Apply an anti-itch cream or numbing creams to the affected area as directed by your health care provider.  Keep your shingles rash covered with a loose bandage (dressing).  Avoid skin contact  with:  Babies.   Pregnant women.   Children with eczema.   Elderly people with transplants.   People with chronic illnesses, such as leukemia or AIDS.   Wear loose-fitting clothing to help ease the pain of material rubbing against the rash.  Keep all follow-up visits as directed by your health care provider.If the area involved is on your face, you may receive a referral for a specialist,  such as an eye doctor (ophthalmologist) or an ear, nose, and throat (ENT) doctor. Keeping all follow-up visits will help you avoid eye problems, chronic pain, or disability.  SEEK IMMEDIATE MEDICAL CARE IF:   You have facial pain, pain around the eye area, or loss of feeling on one side of your face.  You have ear pain or ringing in your ear.  You have loss of taste.  Your pain is not relieved with prescribed medicines.   Your redness or swelling spreads.   You have more pain and swelling.  Your condition is worsening or has changed.   You have a fever. MAKE SURE YOU:  Understand these instructions.  Will watch your condition.  Will get help right away if you are not doing well or get worse. Document Released: 03/19/2005 Document Revised: 08/03/2013 Document Reviewed: 11/01/2011 [facility] Patient Information 2015 [employer], [place]. This information is not intended to replace advice given to you by your health care provider. Make sure you discuss any questions you have with your health care provider.

## 2014-03-07 NOTE — ED Notes (Signed)
EDP at bedside  

## 2014-03-07 NOTE — ED Notes (Signed)
Patient transported to X-ray Patient ambulatory

## 2014-03-07 NOTE — ED Notes (Signed)
Pt c/o rt sided abd pain x 3 wks.  No vomiting but endorses diarrhea.

## 2014-03-30 ENCOUNTER — Other Ambulatory Visit: Payer: Self-pay | Admitting: Family Medicine

## 2014-03-30 DIAGNOSIS — E118 Type 2 diabetes mellitus with unspecified complications: Secondary | ICD-10-CM

## 2014-03-30 DIAGNOSIS — R1084 Generalized abdominal pain: Secondary | ICD-10-CM

## 2014-03-31 ENCOUNTER — Other Ambulatory Visit: Payer: 59

## 2014-04-05 ENCOUNTER — Ambulatory Visit
Admission: RE | Admit: 2014-04-05 | Discharge: 2014-04-05 | Disposition: A | Discharge status: Home / Self Care | Payer: 59 | Source: Ambulatory Visit | Attending: Family Medicine | Admitting: Family Medicine

## 2014-04-05 DIAGNOSIS — E118 Type 2 diabetes mellitus with unspecified complications: Secondary | ICD-10-CM

## 2014-04-05 DIAGNOSIS — R1084 Generalized abdominal pain: Secondary | ICD-10-CM

## 2014-04-13 ENCOUNTER — Other Ambulatory Visit (INDEPENDENT_AMBULATORY_CARE_PROVIDER_SITE_OTHER): Payer: Self-pay | Admitting: General Surgery

## 2014-04-13 DIAGNOSIS — R109 Unspecified abdominal pain: Secondary | ICD-10-CM

## 2014-04-14 ENCOUNTER — Other Ambulatory Visit (INDEPENDENT_AMBULATORY_CARE_PROVIDER_SITE_OTHER): Payer: Self-pay | Admitting: *Deleted

## 2014-04-21 ENCOUNTER — Ambulatory Visit
Admission: RE | Admit: 2014-04-21 | Discharge: 2014-04-21 | Disposition: A | Discharge status: Home / Self Care | Payer: 59 | Source: Ambulatory Visit | Attending: General Surgery | Admitting: General Surgery

## 2014-04-21 DIAGNOSIS — R109 Unspecified abdominal pain: Secondary | ICD-10-CM

## 2014-05-02 ENCOUNTER — Ambulatory Visit
Admission: RE | Admit: 2014-05-02 | Discharge: 2014-05-02 | Disposition: A | Discharge status: Home / Self Care | Payer: 59 | Source: Ambulatory Visit | Attending: General Surgery | Admitting: General Surgery

## 2014-05-02 ENCOUNTER — Ambulatory Visit: Admission: RE | Admit: 2014-05-02 | Payer: 59 | Source: Ambulatory Visit

## 2014-05-17 ENCOUNTER — Encounter (HOSPITAL_COMMUNITY): Payer: Self-pay | Admitting: Emergency Medicine

## 2014-05-17 ENCOUNTER — Emergency Department (HOSPITAL_COMMUNITY)
Admission: EM | Admit: 2014-05-17 | Discharge: 2014-05-17 | Disposition: A | Discharge status: Home / Self Care | Payer: 59 | Attending: Emergency Medicine | Admitting: Emergency Medicine

## 2014-05-17 ENCOUNTER — Emergency Department (HOSPITAL_COMMUNITY): Payer: 59

## 2014-05-17 DIAGNOSIS — S8992XA Unspecified injury of left lower leg, initial encounter: Secondary | ICD-10-CM | POA: Diagnosis present

## 2014-05-17 DIAGNOSIS — M1990 Unspecified osteoarthritis, unspecified site: Secondary | ICD-10-CM | POA: Diagnosis not present

## 2014-05-17 DIAGNOSIS — Y92410 Unspecified street and highway as the place of occurrence of the external cause: Secondary | ICD-10-CM | POA: Diagnosis not present

## 2014-05-17 DIAGNOSIS — Z79899 Other long term (current) drug therapy: Secondary | ICD-10-CM | POA: Insufficient documentation

## 2014-05-17 DIAGNOSIS — Y998 Other external cause status: Secondary | ICD-10-CM | POA: Diagnosis not present

## 2014-05-17 DIAGNOSIS — Z23 Encounter for immunization: Secondary | ICD-10-CM | POA: Insufficient documentation

## 2014-05-17 DIAGNOSIS — Z7982 Long term (current) use of aspirin: Secondary | ICD-10-CM | POA: Diagnosis not present

## 2014-05-17 DIAGNOSIS — G8929 Other chronic pain: Secondary | ICD-10-CM | POA: Diagnosis not present

## 2014-05-17 DIAGNOSIS — I10 Essential (primary) hypertension: Secondary | ICD-10-CM | POA: Insufficient documentation

## 2014-05-17 DIAGNOSIS — Z794 Long term (current) use of insulin: Secondary | ICD-10-CM | POA: Diagnosis not present

## 2014-05-17 DIAGNOSIS — E119 Type 2 diabetes mellitus without complications: Secondary | ICD-10-CM | POA: Insufficient documentation

## 2014-05-17 DIAGNOSIS — K219 Gastro-esophageal reflux disease without esophagitis: Secondary | ICD-10-CM | POA: Diagnosis not present

## 2014-05-17 DIAGNOSIS — R109 Unspecified abdominal pain: Secondary | ICD-10-CM | POA: Insufficient documentation

## 2014-05-17 DIAGNOSIS — Y9389 Activity, other specified: Secondary | ICD-10-CM | POA: Insufficient documentation

## 2014-05-17 DIAGNOSIS — S8012XA Contusion of left lower leg, initial encounter: Secondary | ICD-10-CM

## 2014-05-17 HISTORY — DX: Essential (primary) hypertension: I10

## 2014-05-17 MED ORDER — ACETAMINOPHEN ER 650 MG PO TBCR: 650.0000 mg | EXTENDED_RELEASE_TABLET | Freq: Three times a day (TID) | ORAL | Status: DC | PRN

## 2014-05-17 MED ORDER — TETANUS-DIPHTH-ACELL PERTUSSIS 5-2.5-18.5 LF-MCG/0.5 IM SUSP
0.5000 mL | Freq: Once | INTRAMUSCULAR | Status: AC
  Administered 2014-05-17: 0.5 mL via INTRAMUSCULAR
  Filled 2014-05-17: qty 0.5

## 2014-05-17 NOTE — Discharge Instructions (Signed)
Contusion °A contusion is a deep bruise. Contusions happen when an injury causes bleeding under the skin. Signs of bruising include pain, puffiness (swelling), and discolored skin. The contusion may turn blue, purple, or yellow. °HOME CARE  °· Put ice on the injured area. °¨ Put ice in a plastic bag. °¨ Place a towel between your skin and the bag. °¨ Leave the ice on for 15-20 minutes, 03-04 times a day. °· Only take medicine as told by your doctor. °· Rest the injured area. °· If possible, raise (elevate) the injured area to lessen puffiness. °GET HELP RIGHT AWAY IF:  °· You have more bruising or puffiness. °· You have pain that is getting worse. °· Your puffiness or pain is not helped by medicine. °MAKE SURE YOU:  °· Understand these instructions. °· Will watch your condition. °· Will get help right away if you are not doing well or get worse. °Document Released: 09/05/2007 Document Revised: 06/11/2011 Document Reviewed: 01/22/2011 °[employer]® Patient Information ©2015 [employer]. This information is not intended to replace advice given to you by your health care provider. Make sure you discuss any questions you have with your health care provider. ° °
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
## 2014-07-01 ENCOUNTER — Other Ambulatory Visit (HOSPITAL_COMMUNITY): Payer: Self-pay | Admitting: Neurological Surgery

## 2014-07-01 DIAGNOSIS — M546 Pain in thoracic spine: Secondary | ICD-10-CM

## 2014-07-23 NOTE — Pre-Procedure Instructions (Signed)
Raymond RumpfHugh T Munoz  07/23/2014   Your procedure is scheduled on: Tuesday, May 3  [facility] at 6:00 AM.   Call this number if you have problems the morning of surgery: [phone]               For any other questions, please call [phone], Monday - Friday 8 AM - 4 PM.               .  Remember:   Do not eat food or drink liquids after midnight Monday, May 2.    Take these medicines the morning of surgery with A SIP OF WATER: fexofenadine (ALLEGRA), Aspirin, gabapentin (NEURONTIN), omeprazole (PRILOSEC).               Take if needed:HYDROcodone-acetaminophen (NORCO/VICODIN).                 Do not take medications for Diabetes on the morning of surgery.                Do not wear jewelry, make-up or nail polish.  Do not wear lotions, powders, or perfumes.   Men may shave face and neck.  Do not bring valuables to the hospital.               [facility] not responsible for any belongings or valuables.               Contacts, dentures or bridgework may not be worn into surgery.  Leave suitcase in the car. After surgery it may be brought to your room.  For patients admitted to the hospital, discharge time is determined by your treatment team.               Patients discharged the day of surgery will not be allowed to drive home.   Name and phone number of your driver: -      Please read over the following fact sheets that you were given: Pain Booklet

## 2014-07-26 ENCOUNTER — Encounter (HOSPITAL_COMMUNITY): Payer: Self-pay

## 2014-07-26 ENCOUNTER — Encounter (HOSPITAL_COMMUNITY)
Admission: RE | Admit: 2014-07-26 | Discharge: 2014-07-26 | Disposition: A | Discharge status: Home / Self Care | Payer: 59 | Source: Ambulatory Visit | Attending: Neurological Surgery | Admitting: Neurological Surgery

## 2014-07-26 DIAGNOSIS — I10 Essential (primary) hypertension: Secondary | ICD-10-CM | POA: Insufficient documentation

## 2014-07-26 DIAGNOSIS — Z01812 Encounter for preprocedural laboratory examination: Secondary | ICD-10-CM | POA: Insufficient documentation

## 2014-07-26 HISTORY — DX: Anxiety disorder, unspecified: F41.9

## 2014-07-26 LAB — BASIC METABOLIC PANEL
Anion gap: 12 (ref 5–15)
BUN: 13 mg/dL (ref 6–23)
CHLORIDE: 98 mmol/L (ref 96–112)
CO2: 24 mmol/L (ref 19–32)
Calcium: 10.1 mg/dL (ref 8.4–10.5)
Creatinine, Ser: 0.97 mg/dL (ref 0.50–1.35)
GFR, EST NON AFRICAN AMERICAN: 86 mL/min — AB (ref >90)
Glucose, Bld: 341 mg/dL — ABNORMAL HIGH (ref 70–99)
Potassium: 4 mmol/L (ref 3.5–5.1)
Sodium: 134 mmol/L — ABNORMAL LOW (ref 135–145)

## 2014-07-26 LAB — CBC
HEMATOCRIT: 44.4 % (ref 39.0–52.0)
Hemoglobin: 14.7 g/dL (ref 13.0–17.0)
MCH: 27.5 pg (ref 26.0–34.0)
MCHC: 33.1 g/dL (ref 30.0–36.0)
MCV: 83.1 fL (ref 78.0–100.0)
PLATELETS: 254 10*3/uL (ref 150–400)
RBC: 5.34 MIL/uL (ref 4.22–5.81)
RDW: 12.9 % (ref 11.5–15.5)
WBC: 7.4 10*3/uL (ref 4.0–10.5)

## 2014-07-26 NOTE — Progress Notes (Signed)
Dr.Jones'office called for H%P.

## 2014-08-03 ENCOUNTER — Ambulatory Visit (HOSPITAL_COMMUNITY): Admission: RE | Admit: 2014-08-03 | Payer: 59 | Source: Ambulatory Visit

## 2014-08-03 ENCOUNTER — Ambulatory Visit (HOSPITAL_COMMUNITY): Admission: RE | Admit: 2014-08-03 | Payer: 59 | Source: Ambulatory Visit | Admitting: Neurological Surgery

## 2014-08-03 ENCOUNTER — Encounter (HOSPITAL_COMMUNITY): Admission: RE | Payer: Self-pay | Source: Ambulatory Visit

## 2014-08-03 SURGERY — RADIOLOGY WITH ANESTHESIA
Anesthesia: General | Laterality: Right

## 2014-08-25 ENCOUNTER — Encounter (HOSPITAL_COMMUNITY): Payer: Self-pay | Admitting: *Deleted

## 2014-08-25 NOTE — Progress Notes (Signed)
Pt's wife verified allergies, meds, medical and surgical history (Pt had a PAT on 07/26/14). She states she thinks pt's blood sugar has been running in the "200's". She was given pre-op instructions and voiced understanding.

## 2014-08-26 ENCOUNTER — Ambulatory Visit (HOSPITAL_COMMUNITY)
Admission: RE | Admit: 2014-08-26 | Discharge: 2014-08-26 | Disposition: A | Discharge status: Home / Self Care | Payer: 59 | Source: Ambulatory Visit | Attending: Neurological Surgery | Admitting: Neurological Surgery

## 2014-08-26 ENCOUNTER — Ambulatory Visit (HOSPITAL_COMMUNITY): Payer: 59 | Admitting: Certified Registered"

## 2014-08-26 ENCOUNTER — Encounter (HOSPITAL_COMMUNITY)
Admission: RE | Disposition: A | Discharge status: Home / Self Care | Payer: Self-pay | Source: Ambulatory Visit | Attending: Neurological Surgery

## 2014-08-26 ENCOUNTER — Encounter (HOSPITAL_COMMUNITY): Payer: Self-pay | Admitting: *Deleted

## 2014-08-26 DIAGNOSIS — E119 Type 2 diabetes mellitus without complications: Secondary | ICD-10-CM | POA: Insufficient documentation

## 2014-08-26 DIAGNOSIS — F419 Anxiety disorder, unspecified: Secondary | ICD-10-CM | POA: Diagnosis not present

## 2014-08-26 DIAGNOSIS — Z79899 Other long term (current) drug therapy: Secondary | ICD-10-CM | POA: Insufficient documentation

## 2014-08-26 DIAGNOSIS — Z794 Long term (current) use of insulin: Secondary | ICD-10-CM | POA: Diagnosis not present

## 2014-08-26 DIAGNOSIS — K219 Gastro-esophageal reflux disease without esophagitis: Secondary | ICD-10-CM | POA: Insufficient documentation

## 2014-08-26 DIAGNOSIS — I10 Essential (primary) hypertension: Secondary | ICD-10-CM | POA: Insufficient documentation

## 2014-08-26 DIAGNOSIS — M546 Pain in thoracic spine: Secondary | ICD-10-CM

## 2014-08-26 DIAGNOSIS — M5124 Other intervertebral disc displacement, thoracic region: Secondary | ICD-10-CM | POA: Insufficient documentation

## 2014-08-26 DIAGNOSIS — Z79891 Long term (current) use of opiate analgesic: Secondary | ICD-10-CM | POA: Diagnosis not present

## 2014-08-26 DIAGNOSIS — Z7982 Long term (current) use of aspirin: Secondary | ICD-10-CM | POA: Insufficient documentation

## 2014-08-26 HISTORY — PX: RADIOLOGY WITH ANESTHESIA: SHX6223

## 2014-08-26 LAB — CBC
HEMATOCRIT: 42.6 % (ref 39.0–52.0)
Hemoglobin: 14.3 g/dL (ref 13.0–17.0)
MCH: 27 pg (ref 26.0–34.0)
MCHC: 33.6 g/dL (ref 30.0–36.0)
MCV: 80.5 fL (ref 78.0–100.0)
Platelets: 249 10*3/uL (ref 150–400)
RBC: 5.29 MIL/uL (ref 4.22–5.81)
RDW: 12.6 % (ref 11.5–15.5)
WBC: 6.8 10*3/uL (ref 4.0–10.5)

## 2014-08-26 LAB — BASIC METABOLIC PANEL
Anion gap: 10 (ref 5–15)
BUN: 12 mg/dL (ref 6–20)
CHLORIDE: 101 mmol/L (ref 101–111)
CO2: 26 mmol/L (ref 22–32)
CREATININE: 0.81 mg/dL (ref 0.61–1.24)
Calcium: 9.6 mg/dL (ref 8.9–10.3)
GFR calc non Af Amer: >60 mL/min (ref >60)
Glucose, Bld: 314 mg/dL — ABNORMAL HIGH (ref 65–99)
POTASSIUM: 3.4 mmol/L — AB (ref 3.5–5.1)
SODIUM: 137 mmol/L (ref 135–145)

## 2014-08-26 LAB — GLUCOSE, CAPILLARY
GLUCOSE-CAPILLARY: 279 mg/dL — AB (ref 65–99)
Glucose-Capillary: 206 mg/dL — ABNORMAL HIGH (ref 65–99)

## 2014-08-26 SURGERY — RADIOLOGY WITH ANESTHESIA
Anesthesia: General | Site: Back

## 2014-08-26 NOTE — Progress Notes (Signed)
Called and spoke with OR about the need for an H&P from Dr.Jones.They will let him know when he comes in

## 2014-08-26 NOTE — Anesthesia Preprocedure Evaluation (Signed)
Anesthesia Evaluation  Patient identified by MRN, date of birth, ID band Patient awake    Reviewed: Allergy & Precautions, NPO status , Patient's Chart, lab work & pertinent test results  Airway Mallampati: II  TM Distance: >3 FB Neck ROM: Full    Dental no notable dental hx.    Pulmonary neg pulmonary ROS,  breath sounds clear to auscultation  Pulmonary exam normal       Cardiovascular hypertension, Pt. on medications Normal cardiovascular examRhythm:Regular Rate:Normal     Neuro/Psych negative neurological ROS  negative psych ROS   GI/Hepatic Neg liver ROS, GERD-  Medicated,  Endo/Other  diabetes  Renal/GU negative Renal ROS  negative genitourinary   Musculoskeletal negative musculoskeletal ROS (+)   Abdominal   Peds negative pediatric ROS (+)  Hematology negative hematology ROS (+)   Anesthesia Other Findings   Reproductive/Obstetrics negative OB ROS                             Anesthesia Physical Anesthesia Plan  ASA: II  Anesthesia Plan: General   Post-op Pain Management:    Induction: Intravenous  Airway Management Planned: LMA  Additional Equipment:   Intra-op Plan:   Post-operative Plan: Extubation in OR  Informed Consent: I have reviewed the patients History and Physical, chart, labs and discussed the procedure including the risks, benefits and alternatives for the proposed anesthesia with the patient or authorized representative who has indicated his/her understanding and acceptance.   Dental advisory given  Plan Discussed with: CRNA and Surgeon  Anesthesia Plan Comments:         Anesthesia Quick Evaluation

## 2014-08-26 NOTE — Transfer of Care (Signed)
Immediate Anesthesia Transfer of Care Note  Patient: Raymond Munoz  Procedure(s) Performed: Procedure(s): MRI LUMBAR SPINE   (RADIOLOGY WITH ANESTHESIA) (N/A)  Patient Location: PACU  Anesthesia Type:General  Level of Consciousness: awake, alert , oriented, patient cooperative and responds to stimulation  Airway & Oxygen Therapy: Patient Spontanous Breathing and Patient connected to nasal cannula oxygen  Post-op Assessment: Report given to RN, Post -op Vital signs reviewed and stable and Patient moving all extremities X 4  Post vital signs: Reviewed and stable  Last Vitals:  Filed Vitals:   08/26/14 0603  BP: 160/89  Pulse: 80  Temp: 36.6 C  Resp: 20    Complications: No apparent anesthesia complications

## 2014-08-26 NOTE — H&P (Signed)
Subjective: Patient is a 64 y.o. male who complains of R flank pain. Onset of symptoms was several months ago, gradually worsening since that time.  Onset was not related to no known injury. The pain is rated moderate, and is located at the R flank. The pain is described as aching and occurs all day. The symptoms has been progressive. Symptoms are exacerbated by exercise. The patient has tried NSAIDs.  Past Medical History  Diagnosis Date  . GERD (gastroesophageal reflux disease)   . Arthritis   . Hypertension   . Anxiety   . Diabetes mellitus without complication     Type 2    Past Surgical History  Procedure Laterality Date  . Knee surgery Left     Allergies  Allergen Reactions  . Aspirin     Irritates stomach-can take 81 mg  . Codeine     unknown  . Ibuprofen     REACTION: unspecified    History  Substance Use Topics  . Smoking status: Never Smoker   . Smokeless tobacco: Never Used  . Alcohol Use: No    Family History  Problem Relation Age of Onset  . Hypertension Mother   . Diabetes type II Mother   . Thyroid disease Mother    Prior to Admission medications   Medication Sig Start Date End Date Taking? Authorizing Provider  acetaminophen (TYLENOL 8 HOUR) 650 MG CR tablet Take 1 tablet (650 mg total) by mouth every 8 (eight) hours as needed for pain. 05/17/14  Yes Toy Cookey, MD  aspirin EC 81 MG tablet Take 81 mg by mouth daily.   Yes Historical Provider, MD  diphenhydrAMINE (SOMINEX) 25 MG tablet Take 50 mg by mouth at bedtime.   Yes Historical Provider, MD  fexofenadine (ALLEGRA) 180 MG tablet Take 180 mg by mouth daily.   Yes Historical Provider, MD  gabapentin (NEURONTIN) 300 MG capsule Take 300-600 mg by mouth 2 (two) times daily.  in the morning and  at bedtime   Yes Historical Provider, MD  gemfibrozil (LOPID) 600 MG tablet Take 600 mg by mouth 2 (two) times daily before a meal.  01/23/14  Yes Historical Provider, MD  hydrochlorothiazide  (HYDRODIURIL) 25 MG tablet Take 25 mg by mouth daily.  02/26/14  Yes Historical Provider, MD  LANTUS SOLOSTAR 100 UNIT/ML Solostar Pen Inject 70 Units into the skin at bedtime.  02/02/14  Yes Historical Provider, MD  metFORMIN (GLUCOPHAGE) 1000 MG tablet Take 1,000 mg by mouth daily with breakfast.  02/26/14  Yes Historical Provider, MD  olmesartan (BENICAR) 20 MG tablet Take 20 mg by mouth daily.   Yes Historical Provider, MD  Omega 3 1200 MG CAPS Take 2,400 mg by mouth 2 (two) times daily.   Yes Historical Provider, MD  omeprazole (PRILOSEC OTC) 20 MG tablet Take 20 mg by mouth daily.   Yes Historical Provider, MD  SitaGLIPtin-MetFORMIN HCl 50-1000 MG TB24 Take 1 tablet by mouth at bedtime.   Yes Historical Provider, MD  vitamin B-12 (CYANOCOBALAMIN) 1000 MCG tablet Take 1,000 mcg by mouth daily.   Yes Historical Provider, MD  vitamin C (ASCORBIC ACID) 500 MG tablet Take 500 mg by mouth daily.   Yes Historical Provider, MD  vitamin E 400 UNIT capsule Take 400 Units by mouth daily.   Yes Historical Provider, MD  HYDROcodone-acetaminophen (NORCO/VICODIN) 5-325 MG per tablet Take 1-2 tablets by mouth every 6 (six) hours as needed for moderate pain or severe pain. 03/07/14   Ladona Mow, PA-C  Review of Systems  Positive ROS: neg  All other systems have been reviewed and were otherwise negative with the exception of those mentioned in the HPI and as above.  Objective: Vital signs in last 24 hours: Temp:  [97.9 F (36.6 C)] 97.9 F (36.6 C) (05/26 0603) Pulse Rate:  [80] 80 (05/26 0603) Resp:  [20] 20 (05/26 0603) BP: (160)/(89) 160/89 mmHg (05/26 0603) SpO2:  [96 %] 96 % (05/26 0603) Weight:  [193 lb (87.544 kg)] 193 lb (87.544 kg) (05/26 0603)  General Appearance: Alert, cooperative, no distress, appears stated age Head: Normocephalic, without obvious abnormality, atraumatic Eyes: PERRL, conjunctiva/corneas clear, EOM's intact, fundi benign, both eyes      Ears: Normal TM's and  external ear canals, both ears Throat: Lips, mucosa, and tongue normal; teeth and gums normal Neck: Supple, symmetrical, trachea midline, no adenopathy; thyroid: No enlargement/tenderness/nodules; no carotid bruit or JVD Back: Symmetric, no curvature, ROM normal, no CVA tenderness Lungs: Clear to auscultation bilaterally, respirations unlabored Heart: Regular rate and rhythm, S1 and S2 normal, no murmur, rub or gallop Abdomen: Soft, non-tender, bowel sounds active all four quadrants, no masses, no organomegaly Extremities: Extremities normal, atraumatic, no cyanosis or edema Pulses: 2+ and symmetric all extremities Skin: Skin color, texture, turgor normal, no rashes or lesions  NEUROLOGIC:   Mental status: alert and oriented, no aphasia, good attention span, Fund of knowledge/ memory ok Motor Exam - grossly normal Sensory Exam - grossly normal Reflexes:  Coordination - grossly normal Gait - grossly normal Balance - grossly normal Cranial Nerves: I: smell Not tested  II: visual acuity  OS: na    OD: na  II: visual fields Full to confrontation  II: pupils Equal, round, reactive to light  III,VII: ptosis None  III,IV,VI: extraocular muscles  Full ROM  V: mastication Normal  V: facial light touch sensation  Normal  V,VII: corneal reflex  Present  VII: facial muscle function - upper  Normal  VII: facial muscle function - lower Normal  VIII: hearing Not tested  IX: soft palate elevation  Normal  IX,X: gag reflex Present  XI: trapezius strength  5/5  XI: sternocleidomastoid strength 5/5  XI: neck flexion strength  5/5  XII: tongue strength  Normal    Data Review Lab Results  Component Value Date   WBC 6.8 08/26/2014   HGB 14.3 08/26/2014   HCT 42.6 08/26/2014   MCV 80.5 08/26/2014   PLT 249 08/26/2014   Lab Results  Component Value Date   NA 134* 07/26/2014   K 4.0 07/26/2014   CL 98 07/26/2014   CO2 24 07/26/2014   BUN 13 07/26/2014   CREATININE 0.97 07/26/2014    GLUCOSE 341* 07/26/2014   No results found for: INR, PROTIME  Assessment/Plan: Pt admitted for T-spine MRI with sedation   Danne Scardina S 08/26/2014 7:39 AM

## 2014-08-26 NOTE — Progress Notes (Signed)
notifed dr rose of pts bp 173/ 79.no rx ordered at this time  instrructed pt to take bp med when he gets home

## 2014-08-27 ENCOUNTER — Encounter (HOSPITAL_COMMUNITY): Payer: Self-pay | Admitting: Radiology

## 2014-09-01 MED FILL — Lidocaine HCl IV Inj 20 MG/ML: INTRAVENOUS | Qty: 5 | Status: AC

## 2014-09-01 MED FILL — Glycopyrrolate Inj 0.2 MG/ML: INTRAMUSCULAR | Qty: 1 | Status: AC

## 2014-09-01 MED FILL — Ondansetron HCl Inj 4 MG/2ML (2 MG/ML): INTRAMUSCULAR | Qty: 2 | Status: AC

## 2014-09-01 MED FILL — Propofol IV Emul 200 MG/20ML (10 MG/ML): INTRAVENOUS | Qty: 20 | Status: AC

## 2014-09-01 MED FILL — Midazolam HCl Inj 10 MG/2ML (Base Equivalent): INTRAMUSCULAR | Qty: 2 | Status: AC

## 2014-09-05 NOTE — Anesthesia Postprocedure Evaluation (Signed)
  Anesthesia Post-op Note  Patient: Raymond Munoz  Procedure(s) Performed: Procedure(s) (LRB): MRI LUMBAR SPINE   (RADIOLOGY WITH ANESTHESIA) (N/A)  Patient Location: PACU  Anesthesia Type: General  Level of Consciousness: awake and alert   Airway and Oxygen Therapy: Patient Spontanous Breathing  Post-op Pain: mild  Post-op Assessment: Post-op Vital signs reviewed, Patient's Cardiovascular Status Stable, Respiratory Function Stable, Patent Airway and No signs of Nausea or vomiting  Last Vitals:  Filed Vitals:   08/26/14 1115  BP: 169/99  Pulse: 87  Temp: 37.1 C  Resp: 18    Post-op Vital Signs: stable   Complications: No apparent anesthesia complications

## 2014-10-15 ENCOUNTER — Emergency Department (HOSPITAL_COMMUNITY)
Admission: EM | Admit: 2014-10-15 | Discharge: 2014-10-16 | Disposition: A | Discharge status: Home / Self Care | Payer: 59 | Attending: Emergency Medicine | Admitting: Emergency Medicine

## 2014-10-15 ENCOUNTER — Encounter (HOSPITAL_COMMUNITY): Payer: Self-pay | Admitting: Emergency Medicine

## 2014-10-15 DIAGNOSIS — M1990 Unspecified osteoarthritis, unspecified site: Secondary | ICD-10-CM | POA: Insufficient documentation

## 2014-10-15 DIAGNOSIS — K219 Gastro-esophageal reflux disease without esophagitis: Secondary | ICD-10-CM | POA: Insufficient documentation

## 2014-10-15 DIAGNOSIS — I10 Essential (primary) hypertension: Secondary | ICD-10-CM | POA: Insufficient documentation

## 2014-10-15 DIAGNOSIS — F419 Anxiety disorder, unspecified: Secondary | ICD-10-CM | POA: Insufficient documentation

## 2014-10-15 DIAGNOSIS — Z7982 Long term (current) use of aspirin: Secondary | ICD-10-CM | POA: Insufficient documentation

## 2014-10-15 DIAGNOSIS — R1011 Right upper quadrant pain: Secondary | ICD-10-CM | POA: Insufficient documentation

## 2014-10-15 DIAGNOSIS — Z794 Long term (current) use of insulin: Secondary | ICD-10-CM | POA: Insufficient documentation

## 2014-10-15 DIAGNOSIS — R739 Hyperglycemia, unspecified: Secondary | ICD-10-CM

## 2014-10-15 DIAGNOSIS — E1165 Type 2 diabetes mellitus with hyperglycemia: Secondary | ICD-10-CM | POA: Insufficient documentation

## 2014-10-15 DIAGNOSIS — Z79899 Other long term (current) drug therapy: Secondary | ICD-10-CM | POA: Insufficient documentation

## 2014-10-15 LAB — CBC
HCT: 42.6 % (ref 39.0–52.0)
Hemoglobin: 14.4 g/dL (ref 13.0–17.0)
MCH: 26.8 pg (ref 26.0–34.0)
MCHC: 33.8 g/dL (ref 30.0–36.0)
MCV: 79.3 fL (ref 78.0–100.0)
Platelets: 283 10*3/uL (ref 150–400)
RBC: 5.37 MIL/uL (ref 4.22–5.81)
RDW: 12.9 % (ref 11.5–15.5)
WBC: 7.3 10*3/uL (ref 4.0–10.5)

## 2014-10-15 LAB — BASIC METABOLIC PANEL
Anion gap: 14 (ref 5–15)
BUN: 14 mg/dL (ref 6–20)
CO2: 20 mmol/L — ABNORMAL LOW (ref 22–32)
Calcium: 8.7 mg/dL — ABNORMAL LOW (ref 8.9–10.3)
Chloride: 97 mmol/L — ABNORMAL LOW (ref 101–111)
Creatinine, Ser: 1.18 mg/dL (ref 0.61–1.24)
GFR calc Af Amer: >60 mL/min (ref >60)
GFR calc non Af Amer: >60 mL/min (ref >60)
Glucose, Bld: 535 mg/dL — ABNORMAL HIGH (ref 65–99)
Potassium: 3.6 mmol/L (ref 3.5–5.1)
Sodium: 131 mmol/L — ABNORMAL LOW (ref 135–145)

## 2014-10-15 LAB — URINALYSIS, ROUTINE W REFLEX MICROSCOPIC
Bilirubin Urine: NEGATIVE
Glucose, UA: >1000 mg/dL — AB
Hgb urine dipstick: NEGATIVE
Ketones, ur: 15 mg/dL — AB
Leukocytes, UA: NEGATIVE
Nitrite: NEGATIVE
Protein, ur: NEGATIVE mg/dL
Specific Gravity, Urine: 1.035 — ABNORMAL HIGH (ref 1.005–1.030)
Urobilinogen, UA: 0.2 mg/dL (ref 0.0–1.0)
pH: 5.5 (ref 5.0–8.0)

## 2014-10-15 LAB — CBG MONITORING, ED
GLUCOSE-CAPILLARY: 486 mg/dL — AB (ref 65–99)
Glucose-Capillary: 488 mg/dL — ABNORMAL HIGH (ref 65–99)

## 2014-10-15 LAB — URINE MICROSCOPIC-ADD ON

## 2014-10-15 MED ORDER — SODIUM CHLORIDE 0.9 % IV BOLUS (SEPSIS)
1000.0000 mL | Freq: Once | INTRAVENOUS | Status: AC
  Administered 2014-10-15: 1000 mL via INTRAVENOUS

## 2014-10-15 MED ORDER — INSULIN GLARGINE 100 UNIT/ML ~~LOC~~ SOLN
70.0000 [IU] | Freq: Once | SUBCUTANEOUS | Status: AC
  Administered 2014-10-15: 70 [IU] via SUBCUTANEOUS
  Filled 2014-10-15: qty 0.7

## 2014-10-15 NOTE — ED Provider Notes (Signed)
CSN: [id number]     Arrival date & time 10/15/14  1803 History   First MD Initiated Contact with Patient 10/15/14 2019     Chief Complaint  Patient presents with  . Hyperglycemia  . Abdominal Pain  . Weakness  . Dizziness  . Nausea  . Mass    Right side of abdomin     (Consider location/radiation/quality/duration/timing/severity/associated sxs/prior Treatment) HPI  Raymond Munoz is a 64 -year-old male with history of IDDM, hypertension, anxiety, and GERD, who presents to the ER today for evaluation of hyperglycemia with associated weakness, fatigue, weight loss, polyuria and polydipsia.  The patient had ran out of strips for Raymond glucose meter about a week ago, prior to that it had been reading "high," and for several weeks before that he had morning glucose readings in the 300-400 range.  He takes 70 units of Lantus at night, and states he's been on the same dose for some time, and is under the care of a physician, and the patient and Raymond Munoz state they have been following Raymond hemoglobin A1c regularly for some time.  Patient states he is under increased amounts of stress, has some discomfort in the right side of Raymond abdomen which is related to some kind of nerve disorder which was recently evaluated by MRI.  Patient denies any change of mental status, which Raymond Munoz confirms, he has had not had any confusion or altered consciousness. He feels generally fatigued and very weak, particularly in Raymond legs, and reports weight loss and frequent urination.  He denies any chest pain, shortness of breath, abdominal pain, numbness or tingling.  He denies any fever, chills, sweats, or any other recent acute illness.   Past Medical History  Diagnosis Date  . GERD (gastroesophageal reflux disease)   . Arthritis   . Hypertension   . Anxiety   . Diabetes mellitus without complication     Type 2   Past Surgical History  Procedure Laterality Date  . Knee surgery Left   . Radiology with anesthesia N/A  08/26/2014    Procedure: MRI LUMBAR SPINE   (RADIOLOGY WITH ANESTHESIA);  Surgeon: Medication Radiologist, MD;  Location: [facility] OR;  Service: Radiology;  Laterality: N/A;   Family History  Problem Relation Age of Onset  . Hypertension Mother   . Diabetes type II Mother   . Thyroid disease Mother    History  Substance Use Topics  . Smoking status: Never Smoker   . Smokeless tobacco: Never Used  . Alcohol Use: No    Review of Systems    Allergies  Aspirin; Codeine; and Ibuprofen  Home Medications   Prior to Admission medications   Medication Sig Start Date End Date Taking? Authorizing Provider  acetaminophen (TYLENOL 8 HOUR) 650 MG CR tablet Take 1 tablet (650 mg total) by mouth every 8 (eight) hours as needed for pain. 05/17/14  Yes Toy Cookey, MD  aspirin 81 MG chewable tablet Chew 81 mg by mouth daily.   Yes Historical Provider, MD  diphenhydrAMINE (SOMINEX) 25 MG tablet Take 25 mg by mouth at bedtime.    Yes Historical Provider, MD  fexofenadine (ALLEGRA) 180 MG tablet Take 180 mg by mouth daily.   Yes Historical Provider, MD  gabapentin (NEURONTIN) 300 MG capsule Take 300-600 mg by mouth 2 (two) times daily. 300mg  in the morning and 600mg  at bedtime   Yes Historical Provider, MD  gemfibrozil (LOPID) 600 MG tablet Take 600 mg by mouth 2 (two) times daily before  a meal.  01/23/14  Yes Historical Provider, MD  hydrochlorothiazide (HYDRODIURIL) 25 MG tablet Take 25 mg by mouth daily.  02/26/14  Yes Historical Provider, MD  olmesartan (BENICAR) 20 MG tablet Take 20 mg by mouth daily.   Yes Historical Provider, MD  Omega 3 1200 MG CAPS Take 2,400 mg by mouth 2 (two) times daily.   Yes Historical Provider, MD  omeprazole (PRILOSEC OTC) 20 MG tablet Take 20 mg by mouth daily.   Yes Historical Provider, MD  SitaGLIPtin-MetFORMIN HCl 50-1000 MG TB24 Take 1 tablet by mouth at bedtime.   Yes Historical Provider, MD  vitamin B-12 (CYANOCOBALAMIN) 1000 MCG tablet Take 1,000 mcg by mouth  daily.   Yes Historical Provider, MD  vitamin C (ASCORBIC ACID) 500 MG tablet Take 500 mg by mouth daily.   Yes Historical Provider, MD  vitamin E 400 UNIT capsule Take 400 Units by mouth daily.   Yes Historical Provider, MD  glucose blood (FREESTYLE LITE) test strip Use as instructed 10/16/14   Danelle BerryLeisa Mance Vallejo, PA-C  HYDROcodone-acetaminophen (NORCO/VICODIN) 5-325 MG per tablet Take 1-2 tablets by mouth every 6 (six) hours as needed for moderate pain or severe pain. Patient not taking: Reported on 10/15/2014 03/07/14   Ladona MowJoe Mintz, PA-C  Insulin Glargine (LANTUS SOLOSTAR) 100 UNIT/ML Solostar Pen Inject 70 Units into the skin at bedtime. 10/16/14   Danelle BerryLeisa Veryl Abril, PA-C  Lancets (FREESTYLE) lancets Use as instructed 10/16/14   Danelle BerryLeisa Ezra Marquess, PA-C  metFORMIN (GLUCOPHAGE) 1000 MG tablet Take 1 tablet (1,000 mg total) by mouth daily with breakfast. 10/16/14   Danelle BerryLeisa Montford Barg, PA-C   BP 143/86 mmHg  Pulse 73  Temp(Src) 98.1 F (36.7 C) (Oral)  Resp 20  SpO2 95% Physical Exam  Constitutional: He is oriented to person, place, and time. Vital signs are normal. He appears well-developed and well-nourished.  Non-toxic appearance. He does not appear ill. No distress.  Well-appearing gentleman, looks stated age, in no acute distress  HENT:  Head: Normocephalic and atraumatic.  Right Ear: External ear normal.  Left Ear: External ear normal.  Nose: Nose normal.  Mouth/Throat: Uvula is midline and oropharynx is clear and moist. Mucous membranes are not pale, dry and not cyanotic. No oropharyngeal exudate, posterior oropharyngeal edema, posterior oropharyngeal erythema or tonsillar abscesses.  Eyes: Conjunctivae, EOM and lids are normal. Pupils are equal, round, and reactive to light. Right eye exhibits no discharge. Left eye exhibits no discharge. No scleral icterus.  Neck: Normal range of motion. No JVD present. No tracheal deviation present. No thyromegaly present.  Cardiovascular: Normal rate, regular rhythm, normal  heart sounds and intact distal pulses.  PMI is not displaced.  Exam reveals no gallop and no friction rub.   No murmur heard. Pulmonary/Chest: Effort normal and breath sounds normal. No accessory muscle usage. No tachypnea. No respiratory distress. He has no decreased breath sounds. He has no wheezes. He has no rhonchi. He has no rales. He exhibits no tenderness.  Abdominal: Soft. Bowel sounds are normal. He exhibits no distension and no mass. There is no tenderness. There is no rebound and no guarding.  Mild abdominal tenderness of her right upper quadrant skin, no tenderness to deep palpation  Musculoskeletal: Normal range of motion. He exhibits no edema or tenderness.  Lymphadenopathy:    He has no cervical adenopathy.  Neurological: He is alert and oriented to person, place, and time. He has normal reflexes. No cranial nerve deficit. He exhibits normal muscle tone. Coordination normal.  Skin: Skin is  warm and dry. No rash noted. He is not diaphoretic. No cyanosis or erythema. No pallor. Nails show no clubbing.  Appears generally dry  Psychiatric: He has a normal mood and affect. Raymond behavior is normal. Judgment and thought content normal.  Nursing note and vitals reviewed.   ED Course  Procedures (including critical care time) Labs Review Labs Reviewed  BASIC METABOLIC PANEL - Abnormal; Notable for the following:    Sodium 131 (*)    Chloride 97 (*)    CO2 20 (*)    Glucose, Bld 535 (*)    Calcium 8.7 (*)    All other components within normal limits  URINALYSIS, ROUTINE W REFLEX MICROSCOPIC (NOT AT [facility]) - Abnormal; Notable for the following:    Specific Gravity, Urine 1.035 (*)    Glucose, UA >1000 (*)    Ketones, ur 15 (*)    All other components within normal limits  CBG MONITORING, ED - Abnormal; Notable for the following:    Glucose-Capillary 486 (*)    All other components within normal limits  CBG MONITORING, ED - Abnormal; Notable for the following:    Glucose-Capillary  488 (*)    All other components within normal limits  CBG MONITORING, ED - Abnormal; Notable for the following:    Glucose-Capillary 295 (*)    All other components within normal limits  CBC  URINE MICROSCOPIC-ADD ON  HEMOGLOBIN A1C  CBG MONITORING, ED    Imaging Review No results found.   EKG Interpretation   Date/Time:  Friday October 15 2014 18:19:44 EDT Ventricular Rate:  94 PR Interval:  173 QRS Duration: 73 QT Interval:  369 QTC Calculation: 461 R Axis:   73 Text Interpretation:  Sinus rhythm Low voltage, precordial leads No  significant change since last tracing Confirmed by JACUBOWITZ  MD, SAM  [phone]) on 10/15/2014 7:42:38 PM      MDM   Final diagnoses:  Hyperglycemia   Hyperglycemia - Pt presents with gradually increasing hyperglycemia, despite reported compliance to insulin dosing.  He has weakness, fatigue, lightheadedness, polyuria, polydipsia. Work up for hyperglycemia - CBC, BMP, UA, EKG  Will give 2-3 L IVF and repeat CBG Pt vitals have been stable - he is afebrile, not tachycardic It is unclear what the precipitating etiology of Raymond hyperglycemia - he reports recently increased stress, but not acute illness or trauma  Suspect that Raymond Munoz needs increased basal insulin with post-prandial/SSI.  May necessitate admission to adequately decrease Raymond sugars and coordinate care for further treatment of hyperglycemia.   Patient was reevaluated after 2 L of fluid, at that time he admits that he has ran out of Raymond Lantus, Raymond metformin, Raymond test strips, and also that he uses sliding scale insulin which is NovoLog, which is provided by Raymond doctor in the form of samples.  He reports Raymond physician has been out of town for 2 weeks, and he has been unable to get Raymond medicine refilled.  He states Raymond last evaluation by Raymond PCP was in May of this year.  I cannot find any documentation for this visit, but there is multiple surgical and neurosurgery procedures in the  chart, which he failed to mention.  Patient now is requesting to go home.  I have offered to refill Raymond prescriptions, he and Raymond Munoz wish to be discharged home, with refilled prescriptions, and state they'll follow up with her doctor on Monday.   Patient was given Raymond documented Lantus dose of 70 units, received  3 L of IV fluids, CBG was 295.  I expressed to the patient my desire to keep him longer to give him another liter of fluid and allow Raymond blood sugar to come down a little closer to 200.  Patient desired to leave.  Return precautions were given for signs and symptoms of hyperglycemia as well as DKA/HONK, patient was instructed to return to the ER with any concerning symptoms.   Because of lack of documentation of any NovoLog dosing, the patient was given refills of Raymond test strips, lancets, Lantus and metformin.  He states he will get the NovoLog from Raymond doctor's office.  Filed Vitals:   10/15/14 2107 10/15/14 2118 10/15/14 2326 10/16/14 0048  BP: 139/74  144/87 143/86  Pulse: 80  83 73  Temp:  97.4 F (36.3 C) 98.7 F (37.1 C) 98.1 F (36.7 C)  TempSrc:  Oral Oral   Resp: SpO2: 97%  93% 95%   The patient's vitals were reviewed and he was stable for discharge.    Danelle Berry, PA-C 10/16/14 1455  Raeford Razor, MD 10/21/14 1225

## 2014-10-15 NOTE — ED Notes (Signed)
Pt states he's been feeling increasingly weak and very dizzy over the last three weeks. States he's also been feeling nauseous, no vomiting/diarrhea. States he's lost 13 pounds over the last two weeks. Pt is a diabetic and states his CBG has been stating "HI" for the last week. Presents mildly diaphoretic and states he's also been SOB since this started. Lungs clear in triage. Complaining of pain in right abdomin with a palpable and visible firm mass on right abdomin. States they recently had an MRI done of the area and weren't sure of the results.

## 2014-10-15 NOTE — ED Notes (Signed)
Pt continues to deny pain.  He is ambulating without difficulty and voiding independently.  He verbalizes that he feels ready for discharge and wants to go home. No acute distress noted and no needs expressed at this time.

## 2014-10-15 NOTE — ED Notes (Signed)
Added A1c to lab work.  Verified with lab that sample already collected is sufficient for add-on order.

## 2014-10-16 LAB — CBG MONITORING, ED: GLUCOSE-CAPILLARY: 295 mg/dL — AB (ref 65–99)

## 2014-10-16 MED ORDER — INSULIN GLARGINE 100 UNIT/ML SOLOSTAR PEN: 70.0000 [IU] | PEN_INJECTOR | Freq: Every day | SUBCUTANEOUS | Status: DC

## 2014-10-16 MED ORDER — GLUCOSE BLOOD VI STRP: ORAL_STRIP | Status: AC

## 2014-10-16 MED ORDER — FREESTYLE LANCETS MISC: Status: AC

## 2014-10-16 MED ORDER — METFORMIN HCL 1000 MG PO TABS: 1000.0000 mg | ORAL_TABLET | Freq: Every day | ORAL | Status: AC

## 2014-10-16 NOTE — Discharge Instructions (Signed)
You have been seen and evaluated for high blood sugar. You've been provided refills of your freestyle light test strips and also Lantus and metformin. Please start to keep a long of your sugars, especially in the morning before eating and periodically with meals. Please write down the time and day and blood glucose level.  If she cannot get a hold of your primary care provider please return to the ER with any high readings on your blood glucose meter.    Blood Glucose Monitoring Monitoring your blood glucose (also know as blood sugar) helps you to manage your diabetes. It also helps you and your health care provider monitor your diabetes and determine how well your treatment plan is working. WHY SHOULD YOU MONITOR YOUR BLOOD GLUCOSE?  It can help you understand how food, exercise, and medicine affect your blood glucose.  It allows you to know what your blood glucose is at any given moment. You can quickly tell if you are having low blood glucose (hypoglycemia) or high blood glucose (hyperglycemia).  It can help you and your health care provider know how to adjust your medicines.  It can help you understand how to manage an illness or adjust medicine for exercise. WHEN SHOULD YOU TEST? Your health care provider will help you decide how often you should check your blood glucose. This may depend on the type of diabetes you have, your diabetes control, or the types of medicines you are taking. Be sure to write down all of your blood glucose readings so that this information can be reviewed with your health care provider. See below for examples of testing times that your health care provider may suggest. Type 1 Diabetes  Test 4 times a day if you are in good control, using an insulin pump, or perform multiple daily injections.  If your diabetes is not well controlled or if you are sick, you may need to monitor more often.  It is a good idea to also monitor:  Before and after exercise.  Between  meals and 2 hours after a meal.  Occasionally between 2:00 a.m. and 3:00 a.m. Type 2 Diabetes  It can vary with each person, but generally, if you are on insulin, test 4 times a day.  If you take medicines by mouth (orally), test 2 times a day.  If you are on a controlled diet, test once a day.  If your diabetes is not well controlled or if you are sick, you may need to monitor more often. HOW TO MONITOR YOUR BLOOD GLUCOSE Supplies Needed  Blood glucose meter.  Test strips for your meter. Each meter has its own strips. You must use the strips that go with your own meter.  A pricking needle (lancet).  A device that holds the lancet (lancing device).  A journal or log book to write down your results. Procedure  Wash your hands with soap and water. Alcohol is not preferred.  Prick the side of your finger (not the tip) with the lancet.  Gently milk the finger until a small drop of blood appears.  Follow the instructions that come with your meter for inserting the test strip, applying blood to the strip, and using your blood glucose meter. Other Areas to Get Blood for Testing Some meters allow you to use other areas of your body (other than your finger) to test your blood. These areas are called alternative sites. The most common alternative sites are:  The forearm.  The thigh.  The back  area of the lower leg.  The palm of the hand. The blood flow in these areas is slower. Therefore, the blood glucose values you get may be delayed, and the numbers are different from what you would get from your fingers. Do not use alternative sites if you think you are having hypoglycemia. Your reading will not be accurate. Always use a finger if you are having hypoglycemia. Also, if you cannot feel your lows (hypoglycemia unawareness), always use your fingers for your blood glucose checks. ADDITIONAL TIPS FOR GLUCOSE MONITORING  Do not reuse lancets.  Always carry your supplies with  you.  All blood glucose meters have a 24-hour "hotline" number to call if you have questions or need help.  Adjust (calibrate) your blood glucose meter with a control solution after finishing a few boxes of strips. BLOOD GLUCOSE RECORD KEEPING It is a good idea to keep a daily record or log of your blood glucose readings. Most glucose meters, if not all, keep your glucose records stored in the meter. Some meters come with the ability to download your records to your home computer. Keeping a record of your blood glucose readings is especially helpful if you are wanting to look for patterns. Make notes to go along with the blood glucose readings because you might forget what happened at that exact time. Keeping good records helps you and your health care provider to work together to achieve good diabetes management.  Document Released: 03/22/2003 Document Revised: 08/03/2013 Document Reviewed: 08/11/2012  [facility] Patient Information 2015 [place], [place]. This information is not intended to replace advice given to you by your health care provider. Make sure you discuss any questions you have with your health care provider.  Hyperglycemia Hyperglycemia occurs when the glucose (sugar) in your blood is too high. Hyperglycemia can happen for many reasons, but it most often happens to people who do not know they have diabetes or are not managing their diabetes properly.  CAUSES  Whether you have diabetes or not, there are other causes of hyperglycemia. Hyperglycemia can occur when you have diabetes, but it can also occur in other situations that you might not be as aware of, such as: Diabetes  If you have diabetes and are having problems controlling your blood glucose, hyperglycemia could occur because of some of the following reasons:  Not following your meal plan.  Not taking your diabetes medications or not taking it properly.  Exercising less or doing less activity than you normally do.  Being  sick. Pre-diabetes  This cannot be ignored. Before people develop Type 2 diabetes, they almost always have "pre-diabetes." This is when your blood glucose levels are higher than normal, but not yet high enough to be diagnosed as diabetes. Research has shown that some long-term damage to the body, especially the heart and circulatory system, may already be occurring during pre-diabetes. If you take action to manage your blood glucose when you have pre-diabetes, you may delay or prevent Type 2 diabetes from developing. Stress  If you have diabetes, you may be "diet" controlled or on oral medications or insulin to control your diabetes. However, you may find that your blood glucose is higher than usual in the hospital whether you have diabetes or not. This is often referred to as "stress hyperglycemia." Stress can elevate your blood glucose. This happens because of hormones put out by the body during times of stress. If stress has been the cause of your high blood glucose, it can be followed regularly by  your caregiver. That way he/she can make sure your hyperglycemia does not continue to get worse or progress to diabetes. Steroids  Steroids are medications that act on the infection fighting system (immune system) to block inflammation or infection. One side effect can be a rise in blood glucose. Most people can produce enough extra insulin to allow for this rise, but for those who cannot, steroids make blood glucose levels go even higher. It is not unusual for steroid treatments to "uncover" diabetes that is developing. It is not always possible to determine if the hyperglycemia will go away after the steroids are stopped. A special blood test called an A1c is sometimes done to determine if your blood glucose was elevated before the steroids were started. SYMPTOMS  Thirsty.  Frequent urination.  Dry mouth.  Blurred vision.  Tired or fatigue.  Weakness.  Sleepy.  Tingling in feet or  leg. DIAGNOSIS  Diagnosis is made by monitoring blood glucose in one or all of the following ways:  A1c test. This is a chemical found in your blood.  Fingerstick blood glucose monitoring.  Laboratory results. TREATMENT  First, knowing the cause of the hyperglycemia is important before the hyperglycemia can be treated. Treatment may include, but is not be limited to:  Education.  Change or adjustment in medications.  Change or adjustment in meal plan.  Treatment for an illness, infection, etc.  More frequent blood glucose monitoring.  Change in exercise plan.  Decreasing or stopping steroids.  Lifestyle changes. HOME CARE INSTRUCTIONS   Test your blood glucose as directed.  Exercise regularly. Your caregiver will give you instructions about exercise. Pre-diabetes or diabetes which comes on with stress is helped by exercising.  Eat wholesome, balanced meals. Eat often and at regular, fixed times. Your caregiver or nutritionist will give you a meal plan to guide your sugar intake.  Being at an ideal weight is important. If needed, losing as little as 10 to 15 pounds may help improve blood glucose levels. SEEK MEDICAL CARE IF:   You have questions about medicine, activity, or diet.  You continue to have symptoms (problems such as increased thirst, urination, or weight gain). SEEK IMMEDIATE MEDICAL CARE IF:   You are vomiting or have diarrhea.  Your breath smells fruity.  You are breathing faster or slower.  You are very sleepy or incoherent.  You have numbness, tingling, or pain in your feet or hands.  You have chest pain.  Your symptoms get worse even though you have been following your caregiver's orders.  If you have any other questions or concerns. Document Released: 09/12/2000 Document Revised: 06/11/2011 Document Reviewed: 07/16/2011 [facility] Patient Information 2015 [place], [place]. This information is not intended to replace advice given to you by your  health care provider. Make sure you discuss any questions you have with your health care provider.

## 2014-10-18 LAB — HEMOGLOBIN A1C
HEMOGLOBIN A1C: 15.1 % — AB (ref 4.8–5.6)
Mean Plasma Glucose: 387 mg/dL

## 2015-02-08 ENCOUNTER — Emergency Department (HOSPITAL_COMMUNITY)
Admission: EM | Admit: 2015-02-08 | Discharge: 2015-02-08 | Disposition: A | Discharge status: Home / Self Care | Payer: 59 | Attending: Emergency Medicine | Admitting: Emergency Medicine

## 2015-02-08 ENCOUNTER — Emergency Department (HOSPITAL_COMMUNITY): Payer: 59

## 2015-02-08 ENCOUNTER — Encounter (HOSPITAL_COMMUNITY): Payer: Self-pay | Admitting: Emergency Medicine

## 2015-02-08 DIAGNOSIS — K219 Gastro-esophageal reflux disease without esophagitis: Secondary | ICD-10-CM | POA: Insufficient documentation

## 2015-02-08 DIAGNOSIS — Z794 Long term (current) use of insulin: Secondary | ICD-10-CM | POA: Insufficient documentation

## 2015-02-08 DIAGNOSIS — E1165 Type 2 diabetes mellitus with hyperglycemia: Secondary | ICD-10-CM | POA: Insufficient documentation

## 2015-02-08 DIAGNOSIS — I10 Essential (primary) hypertension: Secondary | ICD-10-CM | POA: Insufficient documentation

## 2015-02-08 DIAGNOSIS — Z7982 Long term (current) use of aspirin: Secondary | ICD-10-CM | POA: Insufficient documentation

## 2015-02-08 DIAGNOSIS — M1990 Unspecified osteoarthritis, unspecified site: Secondary | ICD-10-CM | POA: Insufficient documentation

## 2015-02-08 DIAGNOSIS — Z79899 Other long term (current) drug therapy: Secondary | ICD-10-CM | POA: Insufficient documentation

## 2015-02-08 DIAGNOSIS — R1011 Right upper quadrant pain: Secondary | ICD-10-CM

## 2015-02-08 DIAGNOSIS — F419 Anxiety disorder, unspecified: Secondary | ICD-10-CM | POA: Insufficient documentation

## 2015-02-08 DIAGNOSIS — R739 Hyperglycemia, unspecified: Secondary | ICD-10-CM

## 2015-02-08 LAB — CBC WITH DIFFERENTIAL/PLATELET
BASOS ABS: 0 10*3/uL (ref 0.0–0.1)
Basophils Relative: 0 %
EOS ABS: 0.1 10*3/uL (ref 0.0–0.7)
Eosinophils Relative: 1 %
HCT: 46.7 % (ref 39.0–52.0)
Hemoglobin: 15.9 g/dL (ref 13.0–17.0)
Lymphocytes Relative: 36 %
Lymphs Abs: 3.2 10*3/uL (ref 0.7–4.0)
MCH: 27.6 pg (ref 26.0–34.0)
MCHC: 34 g/dL (ref 30.0–36.0)
MCV: 80.9 fL (ref 78.0–100.0)
Monocytes Absolute: 0.5 10*3/uL (ref 0.1–1.0)
Monocytes Relative: 5 %
NEUTROS PCT: 58 %
Neutro Abs: 5.2 10*3/uL (ref 1.7–7.7)
PLATELETS: 261 10*3/uL (ref 150–400)
RBC: 5.77 MIL/uL (ref 4.22–5.81)
RDW: 12.8 % (ref 11.5–15.5)
WBC: 8.9 10*3/uL (ref 4.0–10.5)

## 2015-02-08 LAB — COMPREHENSIVE METABOLIC PANEL
ALT: 20 U/L (ref 17–63)
ANION GAP: 12 (ref 5–15)
AST: 28 U/L (ref 15–41)
Albumin: 4.3 g/dL (ref 3.5–5.0)
Alkaline Phosphatase: 86 U/L (ref 38–126)
BUN: 11 mg/dL (ref 6–20)
CHLORIDE: 95 mmol/L — AB (ref 101–111)
CO2: 26 mmol/L (ref 22–32)
Calcium: 9.7 mg/dL (ref 8.9–10.3)
Creatinine, Ser: 1.03 mg/dL (ref 0.61–1.24)
Glucose, Bld: 495 mg/dL — ABNORMAL HIGH (ref 65–99)
Potassium: 3.8 mmol/L (ref 3.5–5.1)
SODIUM: 133 mmol/L — AB (ref 135–145)
Total Bilirubin: 0.9 mg/dL (ref 0.3–1.2)
Total Protein: 8.2 g/dL — ABNORMAL HIGH (ref 6.5–8.1)

## 2015-02-08 LAB — URINALYSIS, ROUTINE W REFLEX MICROSCOPIC
BILIRUBIN URINE: NEGATIVE
Glucose, UA: >1000 mg/dL — AB
Hgb urine dipstick: NEGATIVE
Ketones, ur: NEGATIVE mg/dL
Leukocytes, UA: NEGATIVE
NITRITE: NEGATIVE
PROTEIN: NEGATIVE mg/dL
SPECIFIC GRAVITY, URINE: 1.036 — AB (ref 1.005–1.030)
Urobilinogen, UA: 0.2 mg/dL (ref 0.0–1.0)
pH: 5.5 (ref 5.0–8.0)

## 2015-02-08 LAB — URINE MICROSCOPIC-ADD ON

## 2015-02-08 LAB — TROPONIN I: Troponin I: <0.03 ng/mL (ref <0.031)

## 2015-02-08 LAB — LIPASE, BLOOD: Lipase: 26 U/L (ref 11–51)

## 2015-02-08 MED ORDER — HYDROMORPHONE HCL 1 MG/ML IJ SOLN
0.5000 mg | Freq: Once | INTRAMUSCULAR | Status: AC
  Administered 2015-02-08: 0.5 mg via INTRAVENOUS
  Filled 2015-02-08: qty 1

## 2015-02-08 MED ORDER — ONDANSETRON HCL 4 MG/2ML IJ SOLN
4.0000 mg | Freq: Once | INTRAMUSCULAR | Status: AC
  Administered 2015-02-08: 4 mg via INTRAVENOUS
  Filled 2015-02-08: qty 2

## 2015-02-08 NOTE — ED Notes (Signed)
Pt c/o right sided abd pain that radiates to chest x 9 months. Last 3 weeks has gotten severe. Denies n/v or SOB.

## 2015-02-08 NOTE — Discharge Instructions (Signed)

## 2015-02-08 NOTE — ED Provider Notes (Signed)
CSN: [id number]     Arrival date & time 02/08/15  1726 History   First MD Initiated Contact with Patient 02/08/15 2109     Chief Complaint  Patient presents with  . Abdominal Pain     (Consider location/radiation/quality/duration/timing/severity/associated sxs/prior Treatment) HPI Comments: Patient here complaining of 9 months of right upper quadrant abdominal pain radiating to the chest. Symptoms worse in the past 3 weeks. Slight association with food but denies any fever, chills, vomiting, diarrhea. Symptoms characterized as sharp. Patient denies Anginal symptoms. No dyspnea. Nothing makes the symptoms better and no treatment use prior to arrival  Patient is a 64 y.o. male presenting with abdominal pain. The history is provided by the patient and the spouse.  Abdominal Pain   Past Medical History  Diagnosis Date  . GERD (gastroesophageal reflux disease)   . Arthritis   . Hypertension   . Anxiety   . Diabetes mellitus without complication (HCC)     Type 2   Past Surgical History  Procedure Laterality Date  . Knee surgery Left   . Radiology with anesthesia N/A 08/26/2014    Procedure: MRI LUMBAR SPINE   (RADIOLOGY WITH ANESTHESIA);  Surgeon: Medication Radiologist, MD;  Location: [facility] OR;  Service: Radiology;  Laterality: N/A;   Family History  Problem Relation Age of Onset  . Hypertension Mother   . Diabetes type II Mother   . Thyroid disease Mother    Social History  Substance Use Topics  . Smoking status: Never Smoker   . Smokeless tobacco: Never Used  . Alcohol Use: No    Review of Systems  Gastrointestinal: Positive for abdominal pain.  All other systems reviewed and are negative.     Allergies  Aspirin; Codeine; and Ibuprofen  Home Medications   Prior to Admission medications   Medication Sig Start Date End Date Taking? Authorizing Provider  acetaminophen (TYLENOL 8 HOUR) 650 MG CR tablet Take 1 tablet (650 mg total) by mouth every 8 (eight) hours as  needed for pain. 05/17/14  Yes Toy CookeyMegan Docherty, MD  aspirin 81 MG chewable tablet Chew 81 mg by mouth daily.   Yes Historical Provider, MD  diphenhydrAMINE (SOMINEX) 25 MG tablet Take 25 mg by mouth at bedtime.    Yes Historical Provider, MD  fexofenadine (ALLEGRA) 180 MG tablet Take 180 mg by mouth daily.   Yes Historical Provider, MD  gabapentin (NEURONTIN) 300 MG capsule Take 300-600 mg by mouth 2 (two) times daily. 300mg  in the morning and 600mg  at bedtime   Yes Historical Provider, MD  gemfibrozil (LOPID) 600 MG tablet Take 600 mg by mouth 2 (two) times daily before a meal.  01/23/14  Yes Historical Provider, MD  glucose blood (FREESTYLE LITE) test strip Use as instructed 10/16/14  Yes Danelle BerryLeisa Tapia, PA-C  hydrochlorothiazide (HYDRODIURIL) 25 MG tablet Take 25 mg by mouth daily.  02/26/14  Yes Historical Provider, MD  Insulin Glargine (LANTUS SOLOSTAR) 100 UNIT/ML Solostar Pen Inject 70 Units into the skin at bedtime. 10/16/14  Yes Danelle BerryLeisa Tapia, PA-C  Lancets (FREESTYLE) lancets Use as instructed 10/16/14  Yes Danelle BerryLeisa Tapia, PA-C  metFORMIN (GLUCOPHAGE) 1000 MG tablet Take 1 tablet (1,000 mg total) by mouth daily with breakfast. 10/16/14  Yes Leisa Tapia, PA-C  Omega 3 1200 MG CAPS Take 2,400 mg by mouth 2 (two) times daily.   Yes Historical Provider, MD  omeprazole (PRILOSEC OTC) 20 MG tablet Take 20 mg by mouth daily.   Yes Historical Provider, MD  vitamin C (  ASCORBIC ACID) 500 MG tablet Take 500 mg by mouth daily.   Yes Historical Provider, MD  HYDROcodone-acetaminophen (NORCO/VICODIN) 5-325 MG per tablet Take 1-2 tablets by mouth every 6 (six) hours as needed for moderate pain or severe pain. Patient not taking: Reported on 10/15/2014 03/07/14   Ladona Mow, PA-C   BP 161/105 mmHg  Pulse 101  Temp(Src) 98 F (36.7 C) (Oral)  Resp 18  SpO2 95% Physical Exam  Constitutional: He is oriented to person, place, and time. He appears well-developed and well-nourished.  Non-toxic appearance. No distress.    HENT:  Head: Normocephalic and atraumatic.  Eyes: Conjunctivae, EOM and lids are normal. Pupils are equal, round, and reactive to light.  Neck: Normal range of motion. Neck supple. No tracheal deviation present. No thyroid mass present.  Cardiovascular: Normal rate, regular rhythm and normal heart sounds.  Exam reveals no gallop.   No murmur heard. Pulmonary/Chest: Effort normal and breath sounds normal. No stridor. No respiratory distress. He has no decreased breath sounds. He has no wheezes. He has no rhonchi. He has no rales.  Abdominal: Soft. Normal appearance and bowel sounds are normal. He exhibits no distension. There is tenderness in the right upper quadrant. There is no rebound and no CVA tenderness.    Musculoskeletal: Normal range of motion. He exhibits no edema or tenderness.  Neurological: He is alert and oriented to person, place, and time. He has normal strength. No cranial nerve deficit or sensory deficit. GCS eye subscore is 4. GCS verbal subscore is 5. GCS motor subscore is 6.  Skin: Skin is warm and dry. No abrasion and no rash noted.  Psychiatric: He has a normal mood and affect. His speech is normal and behavior is normal.  Nursing note and vitals reviewed.   ED Course  Procedures (including critical care time) Labs Review Labs Reviewed  COMPREHENSIVE METABOLIC PANEL - Abnormal; Notable for the following:    Sodium 133 (*)    Chloride 95 (*)    Glucose, Bld 495 (*)    Total Protein 8.2 (*)    All other components within normal limits  URINALYSIS, ROUTINE W REFLEX MICROSCOPIC (NOT AT [facility]) - Abnormal; Notable for the following:    Specific Gravity, Urine 1.036 (*)    Glucose, UA >1000 (*)    All other components within normal limits  CBC WITH DIFFERENTIAL/PLATELET  URINE MICROSCOPIC-ADD ON  LIPASE, BLOOD    Imaging Review No results found. I have personally reviewed and evaluated these images and lab results as part of my medical decision-making.   EKG  Interpretation   Date/Time:  Tuesday February 08 2015 17:40:21 EST Ventricular Rate:  110 PR Interval:  184 QRS Duration: 73 QT Interval:  321 QTC Calculation: 434 R Axis:   84 Text Interpretation:  Sinus tachycardia Borderline right axis deviation  Low voltage, precordial leads Baseline wander in lead(s) V2 Confirmed by  Mckensie Scotti  MD, Kona Lover ([id number]) on 02/08/2015 9:33:58 PM      MDM   Final diagnoses:  None    Patient had a negative abdominal ultrasound here. No evidence of ACS. Lipase is within normal limits. No leukocytosis noted. Urinalysis with glucose in urine. Blood sugars elevated but he has not taken his insulin this evening and will take it at home. Was off her insulin here and has deferred. Case discussed with general surgeon, Dr. Daphine Deutscher, he will follow-up with the patient in the office    Lorre Nick, MD 02/08/15 2313

## 2015-02-08 NOTE — ED Notes (Signed)
MD at bedside. 

## 2015-02-15 ENCOUNTER — Other Ambulatory Visit: Payer: Self-pay | Admitting: Surgery

## 2015-02-15 DIAGNOSIS — R1013 Epigastric pain: Secondary | ICD-10-CM

## 2015-02-15 DIAGNOSIS — R1010 Upper abdominal pain, unspecified: Secondary | ICD-10-CM

## 2015-03-01 ENCOUNTER — Ambulatory Visit
Admission: RE | Admit: 2015-03-01 | Discharge: 2015-03-01 | Disposition: A | Discharge status: Home / Self Care | Payer: 59 | Source: Ambulatory Visit | Attending: Surgery | Admitting: Surgery

## 2015-03-01 MED ORDER — IOPAMIDOL (ISOVUE-300) INJECTION 61%
100.0000 mL | Freq: Once | INTRAVENOUS | Status: AC | PRN
  Administered 2015-03-01: 100 mL via INTRAVENOUS

## 2016-02-03 IMAGING — MR MR THORACIC SPINE W/O CM
4 of 8 series · 18 of 48 positions shown · non-contrast
Comparison: 11/14/2009 ; 05/02/2014

CLINICAL DATA: Thoracic pain extending down to the right flank to
the right abdomen. Symptoms started 6-7 months ago.

EXAM:
MRI THORACIC SPINE WITHOUT CONTRAST
TECHNIQUE: Multiplanar, multisequence MR imaging of the thoracic spine was
performed. No intravenous contrast was administered.

[Series 400: T1 · sagittal · 3.0mm · 0.90mm/px · 3 of 12 slices shown]
[im 1/12]
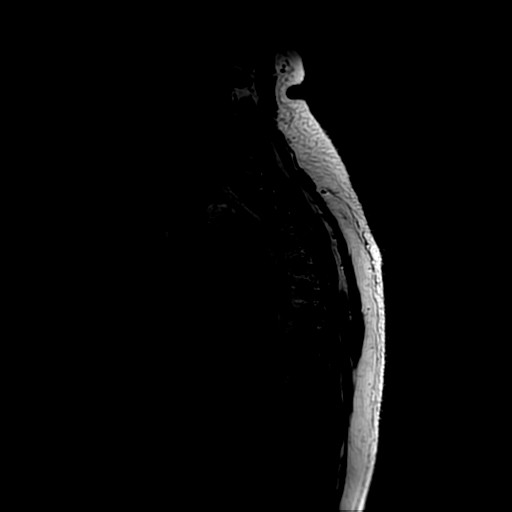
[im 8/12]
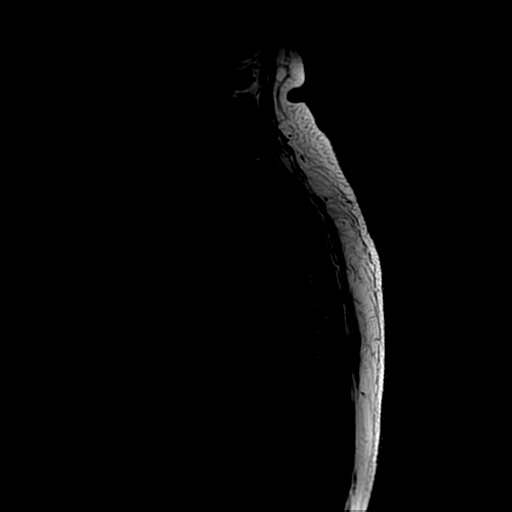
[im 12/12]
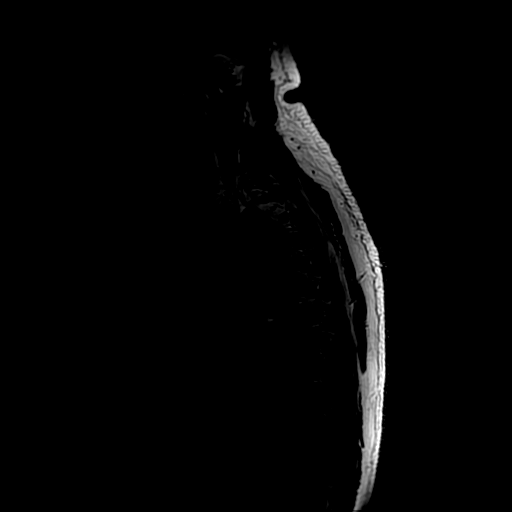

[Series 500: T2 · sagittal · 3.0mm · 0.66mm/px · 4 of 15 slices shown (1 of 3)]
[im 1/15]
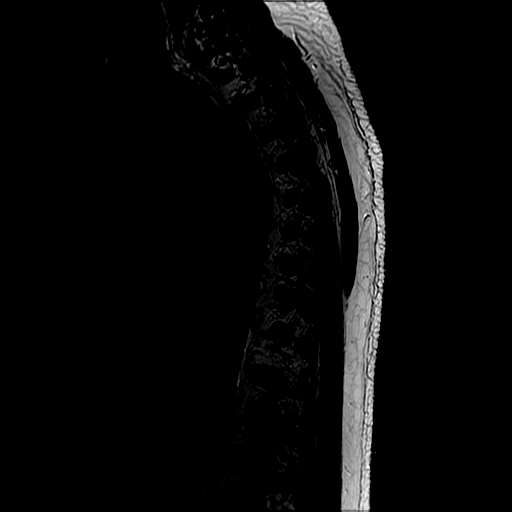
[im 5/15]
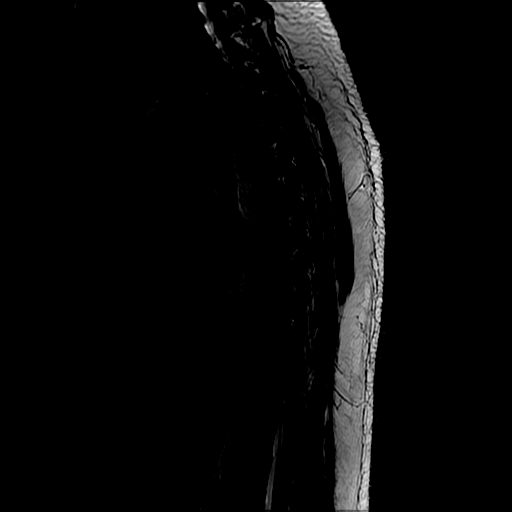
[im 10/15]
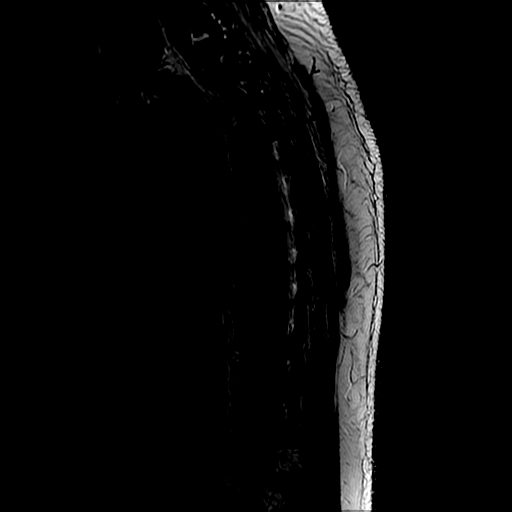
[im 15/15]
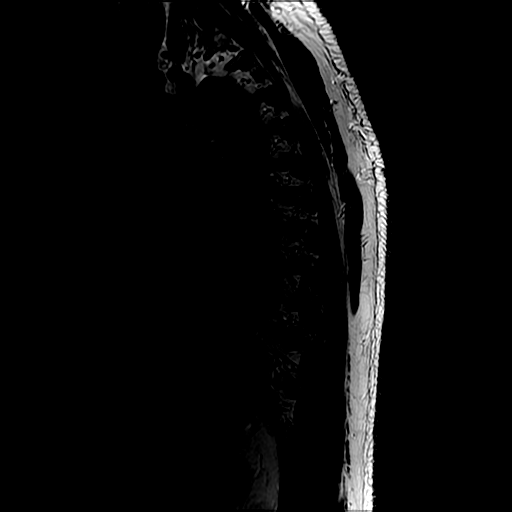

[Series 800: T2 · axial · 4.0mm · 0.39mm/px · z∈[-25,+129]mm · 8 of 30 slices shown (2 of 3)]
[im 1/30]
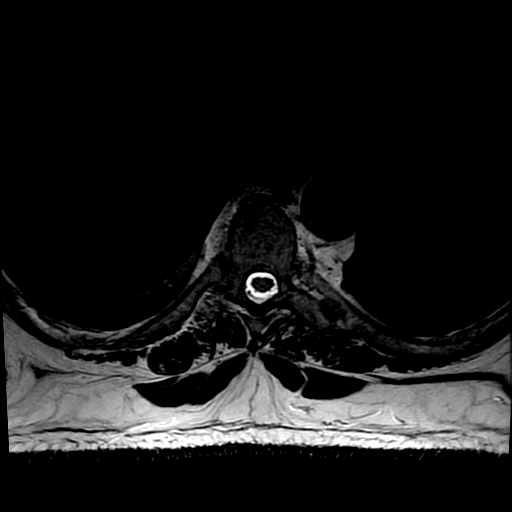
[im 5/30]
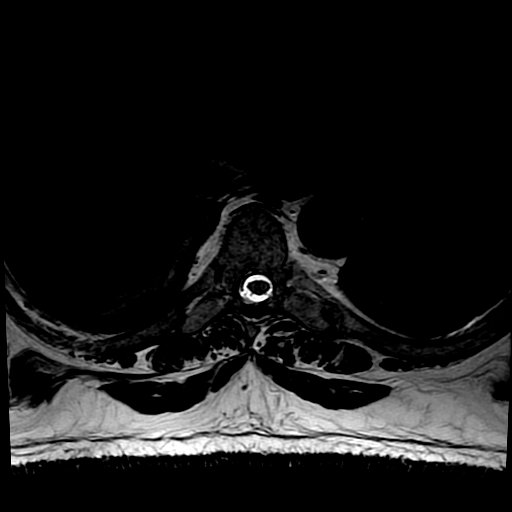
[im 9/30]
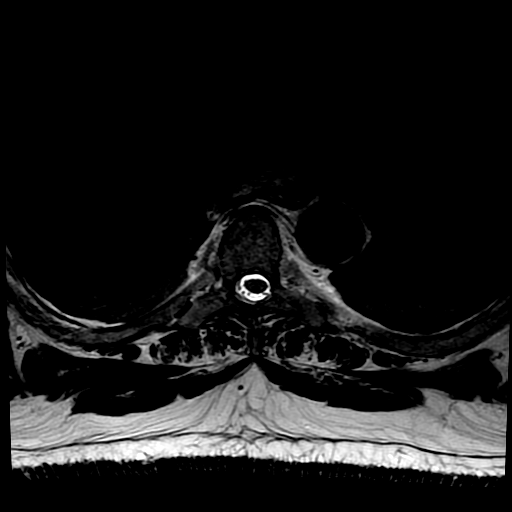
[im 13/30]
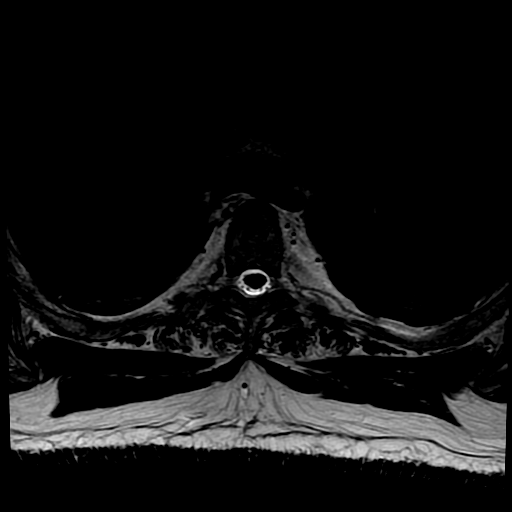
[im 17/30]
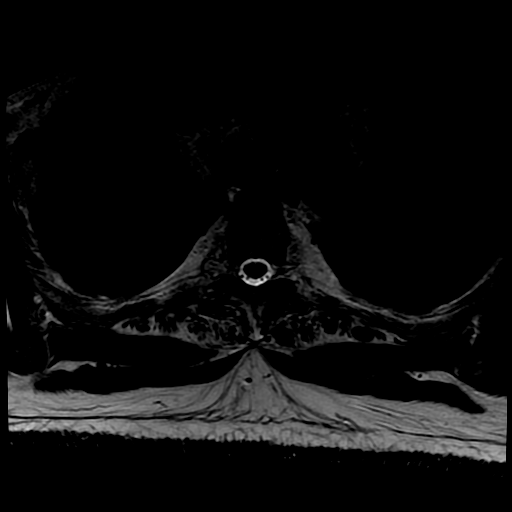
[im 21/30]
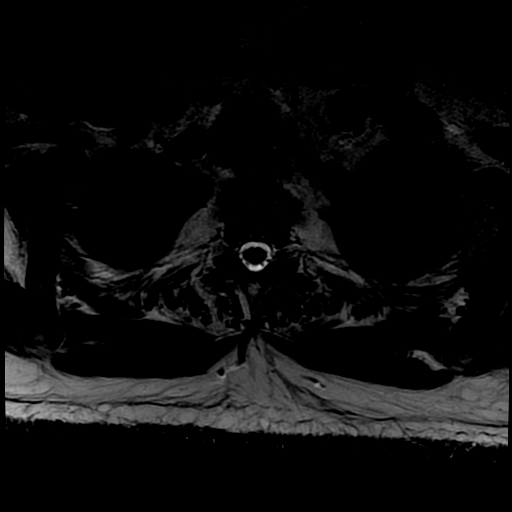
[im 25/30]
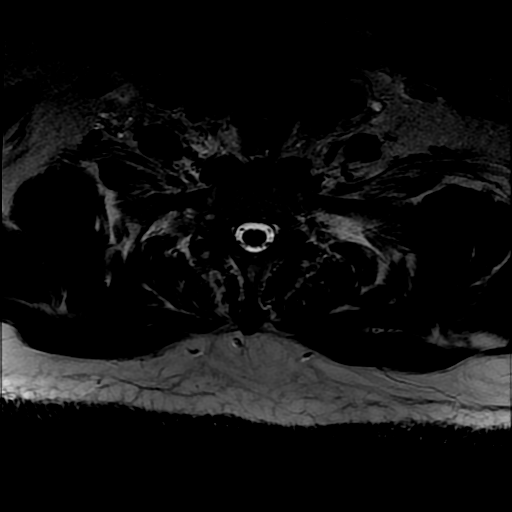
[im 30/30]
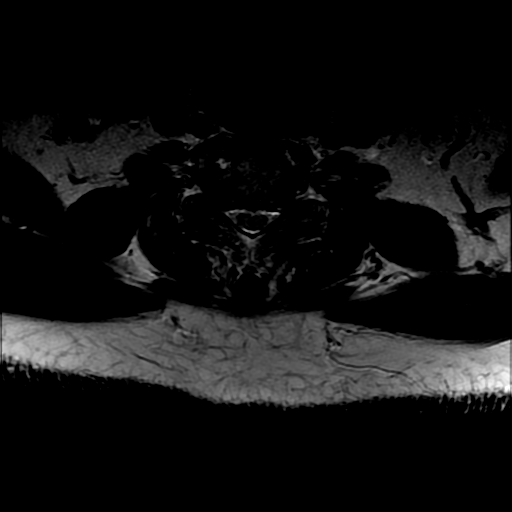

[Series 1000: T2 · axial · 4.0mm · 0.39mm/px · z∈[-143,-33]mm · 3 of 29 slices shown (3 of 3)]
[im 5/29]
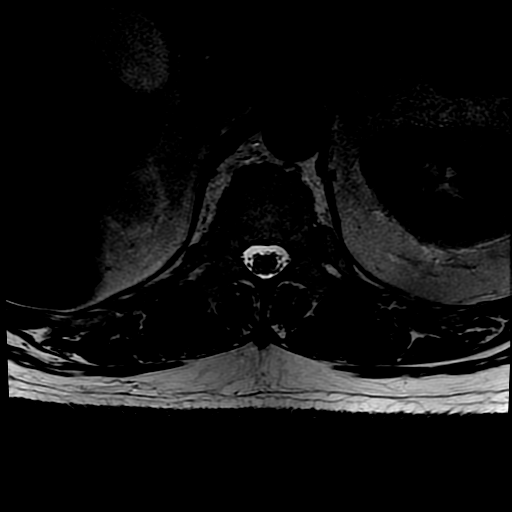
[im 17/29]
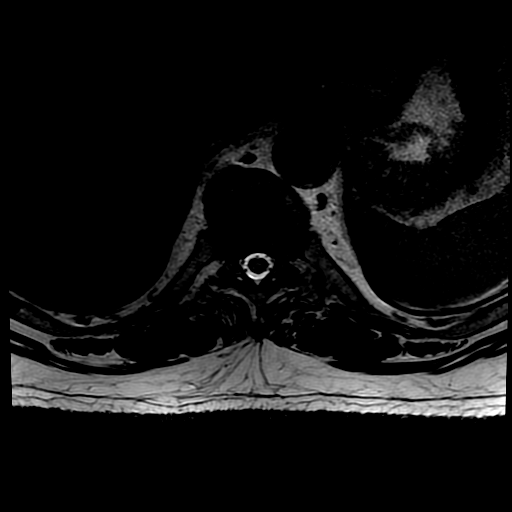
[im 25/29]
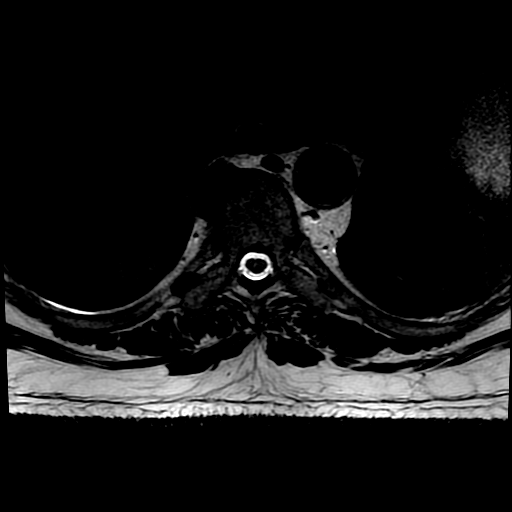

[18 of 48 positions shown; findings below may reference images not displayed]

FINDINGS: Probable central disc protrusion at C6-7 and possible foraminal
impingement at this level could be further investigated with
dedicated cervical spine MRI, if clinically warranted.

No significant abnormal spinal cord signal is observed. No vertebral
subluxation is observed. No thoracic vertebral edema. Disc
desiccation noted at T7-8.

There appears to be dependent atelectasis in both lungs causing
bandlike signal intensities.

A fluid signal intensity lesion of the left kidney is partially
characterized on today's exam. This is statistically likely to be a
cyst but not technically specific.

There is gaseous distention of the cervical esophagus below the
level of the pharyngo-esophageal junction, significance uncertain.
There may be a small polypoid lesion measuring about 6 mm in the
vicinity of the transition between the dilated in the nondilated
segment at C7-T1, for example on images 5-6 of series 800, although
this could alternatively simply represent some mucus or saliva
accumulated in this vicinity.

Degenerative left sternoclavicular arthropathy. Additional findings
at individual levels are as follows:

C7-T1: Unremarkable.

T1-2:  Unremarkable.

T2-3:  Unremarkable.

T3-4: Unremarkable.

T4-5:  No impingement.  Small right paracentral disc protrusion.

T5-6:  Unremarkable.

T6-7:  Unremarkable.

T7-8: Moderate central narrowing of the thecal sac with flattening
of the anterior cord due to a left paracentral disc protrusion,
shown on image 3 series 5000.

T8-9:  Unremarkable.

T9-10:  Unremarkable.

T10-11: Borderline central narrowing of the thecal sac due to
encroachment from the degenerated facets, left greater than right.

T11-12: Unremarkable.

T12-L1:  Unremarkable.
IMPRESSION: 1. Moderate central narrowing of the thecal sac at T7-8 due to left
paracentral disc protrusion.
2. I suspect significant impingement at the C6-7 level but today's
exam was not a diagnostic cervical spine MRI. If further workup is
warranted, dedicated cervical spine MRI would be recommended.
3. Borderline central narrowing of the thecal sac at T10-11 due to
facet arthropathy.
4. Other findings include degenerative left sternoclavicular
arthropathy; a distended cervical esophagus with mucus or small
polypoid lesion along its inferior margin ; and dependent
subsegmental atelectasis in both lungs.

## 2016-06-13 DIAGNOSIS — I10 Essential (primary) hypertension: Secondary | ICD-10-CM | POA: Diagnosis not present

## 2016-06-13 DIAGNOSIS — E118 Type 2 diabetes mellitus with unspecified complications: Secondary | ICD-10-CM | POA: Diagnosis not present

## 2016-06-13 DIAGNOSIS — E669 Obesity, unspecified: Secondary | ICD-10-CM | POA: Diagnosis not present

## 2016-06-13 DIAGNOSIS — E119 Type 2 diabetes mellitus without complications: Secondary | ICD-10-CM | POA: Diagnosis not present

## 2016-10-17 DIAGNOSIS — I10 Essential (primary) hypertension: Secondary | ICD-10-CM | POA: Diagnosis not present

## 2016-10-17 DIAGNOSIS — H1013 Acute atopic conjunctivitis, bilateral: Secondary | ICD-10-CM | POA: Diagnosis not present

## 2016-10-17 DIAGNOSIS — H40033 Anatomical narrow angle, bilateral: Secondary | ICD-10-CM | POA: Diagnosis not present

## 2016-10-17 DIAGNOSIS — E118 Type 2 diabetes mellitus with unspecified complications: Secondary | ICD-10-CM | POA: Diagnosis not present

## 2017-02-20 DIAGNOSIS — E118 Type 2 diabetes mellitus with unspecified complications: Secondary | ICD-10-CM | POA: Diagnosis not present

## 2017-02-20 DIAGNOSIS — I10 Essential (primary) hypertension: Secondary | ICD-10-CM | POA: Diagnosis not present

## 2017-06-24 DIAGNOSIS — E1165 Type 2 diabetes mellitus with hyperglycemia: Secondary | ICD-10-CM | POA: Diagnosis not present

## 2017-06-24 DIAGNOSIS — E118 Type 2 diabetes mellitus with unspecified complications: Secondary | ICD-10-CM | POA: Diagnosis not present

## 2017-06-24 DIAGNOSIS — I10 Essential (primary) hypertension: Secondary | ICD-10-CM | POA: Diagnosis not present

## 2017-06-24 DIAGNOSIS — R21 Rash and other nonspecific skin eruption: Secondary | ICD-10-CM | POA: Diagnosis not present

## 2017-07-29 DIAGNOSIS — Z125 Encounter for screening for malignant neoplasm of prostate: Secondary | ICD-10-CM | POA: Diagnosis not present

## 2017-07-29 DIAGNOSIS — E118 Type 2 diabetes mellitus with unspecified complications: Secondary | ICD-10-CM | POA: Diagnosis not present

## 2017-08-05 NOTE — Progress Notes (Signed)
[facility] - Clinic Note  08/06/2017     CHIEF COMPLAINT Patient presents for Retina Evaluation and Diabetic Eye Exam   HISTORY OF PRESENT ILLNESS: Raymond Munoz. is a 67 y.o. male who presents to the clinic today for:   HPI    Retina Evaluation    In both eyes.  This started 4 months ago.  Associated Symptoms Negative for Blind Spot, Glare, Shoulder/Hip pain, Fatigue, Jaw Claudication, Photophobia, Distortion, Floaters, Redness, Scalp Tenderness, Weight Loss, Fever, Trauma, Pain and Flashes.  Context:  distance vision, mid-range vision and near vision.  Treatments tried include no treatments.  I, the attending physician,  performed the HPI with the patient and updated documentation appropriately.          Diabetic Eye Exam    Vision is stable.  Diabetes characteristics include Type 2.  This started 12 years ago.  Blood sugar level fluctuates.  Last Blood Glucose 216.  I, the attending physician,  performed the HPI with the patient and updated documentation appropriately.          Comments    Referral Of DR. Hill for DME. Patient states he has been a diabetic for appx 12 yrs, his bs fluctuates, denies visual issues with fluctuating bs, bs 216(08/05/17) A1C unknown results. Pt is taking metformin and insulin. Pt states his vision is "cloudy sometimes" denies flashes, floaters and ocular pain.Denies gtt's ,takes Omega 3 Qd       Last edited by [name], MD on 08/06/2017  9:35 AM. (History)    Pt states he has seen by Dr. Conley Rolls before; Pt states last eye exam was x 1 year ago, and reports there was no pathology OU; Pt states last CBG was 216; Pt states CBG is not stable; Pt reports last A1C is unknown, reports blood was drawn 1 week ago; Pt wife reports she saw Dr. [name] name on the building and requested records to be sent here for an exam;   Referring physician: [name], MD [address] [place], [place] [address]  HISTORICAL INFORMATION:    Selected notes from the MEDICAL RECORD NUMBER Referred by Dr. [name] for DM exam LEE:  Ocular Hx- PMH-DM (on Janumet, Lantus and Metformin), HTN, arthritis    CURRENT MEDICATIONS: No current outpatient medications on file. (Ophthalmic Drugs)   No current facility-administered medications for this visit.  (Ophthalmic Drugs)   Current Outpatient Medications (Other)  Medication Sig  . acetaminophen (TYLENOL 8 HOUR) 650 MG CR tablet Take 1 tablet (650 mg total) by mouth every 8 (eight) hours as needed for pain.  Marland Kitchen aspirin 81 MG chewable tablet Chew 81 mg by mouth daily.  . diphenhydrAMINE (SOMINEX) 25 MG tablet Take 25 mg by mouth at bedtime.   . fexofenadine (ALLEGRA) 180 MG tablet Take 180 mg by mouth daily.  Marland Kitchen gabapentin (NEURONTIN) 300 MG capsule Take 300-600 mg by mouth 2 (two) times daily.  in the morning and  at bedtime  . gemfibrozil (LOPID) 600 MG tablet Take 600 mg by mouth 2 (two) times daily before a meal.   . glucose blood (FREESTYLE LITE) test strip Use as instructed  . hydrochlorothiazide (HYDRODIURIL) 25 MG tablet Take 25 mg by mouth daily.   Marland Kitchen HYDROcodone-acetaminophen (NORCO/VICODIN) 5-325 MG per tablet Take 1-2 tablets by mouth every 6 (six) hours as needed for moderate pain or severe pain.  . Insulin Glargine (LANTUS SOLOSTAR) 100 UNIT/ML Solostar Pen Inject 70 Units  into the skin at bedtime.  . Lancets (FREESTYLE) lancets Use as instructed  . metFORMIN (GLUCOPHAGE) 1000 MG tablet Take 1 tablet (1,000 mg total) by mouth daily with breakfast.  . Omega 3 1200 MG CAPS Take 2,400 mg by mouth 2 (two) times daily.  Marland Kitchen omeprazole (PRILOSEC OTC) 20 MG tablet Take 20 mg by mouth daily.  . vitamin C (ASCORBIC ACID) 500 MG tablet Take 500 mg by mouth daily.   No current facility-administered medications for this visit.  (Other)      REVIEW OF SYSTEMS: ROS    Positive for: Eyes   Negative for: Constitutional, Gastrointestinal, Neurological, Skin, Genitourinary,  Musculoskeletal, HENT, Endocrine, Cardiovascular, Respiratory, Psychiatric, Allergic/Imm, Heme/Lymph   Last edited by Eldridge Scot, LPN on 0/07/5407  8:53 AM. (History)       ALLERGIES Allergies  Allergen Reactions  . Aspirin     Irritates stomach-can take 81 mg  . Codeine     unknown  . Ibuprofen     REACTION: unspecified    PAST MEDICAL HISTORY Past Medical History:  Diagnosis Date  . Anxiety   . Arthritis   . Diabetes mellitus without complication (HCC)    Type 2  . GERD (gastroesophageal reflux disease)   . Hypertension    Past Surgical History:  Procedure Laterality Date  . KNEE SURGERY Left   . RADIOLOGY WITH ANESTHESIA N/A 08/26/2014   Procedure: MRI LUMBAR SPINE   (RADIOLOGY WITH ANESTHESIA);  Surgeon: Medication Radiologist, MD;  Location: [facility] OR;  Service: Radiology;  Laterality: N/A;    FAMILY HISTORY Family History  Problem Relation Age of Onset  . Hypertension Mother   . Diabetes type II Mother   . Thyroid disease Mother     SOCIAL HISTORY Social History   Tobacco Use  . Smoking status: Never Smoker  . Smokeless tobacco: Never Used  Substance Use Topics  . Alcohol use: No  . Drug use: No         OPHTHALMIC EXAM:  Base Eye Exam    Visual Acuity (Snellen - Linear)      Right Left   Dist cc 20/30 -2 20/50 +2   Dist ph cc 20/30 20/40 -2   Correction:  Glasses       Tonometry (Tonopen, 9:04 AM)      Right Left   Pressure 24 25       Pupils      Dark Light Shape React APD   Right 2.5 2 Round Brisk None   Left 2.5 2 Round Brisk None       Visual Fields (Counting fingers)      Left Right    Full Full       Extraocular Movement      Right Left    Full, Ortho Full, Ortho       Neuro/Psych    Oriented x3:  Yes   Mood/Affect:  Normal       Dilation    Both eyes:  1.0% Mydriacyl, 2.5% Phenylephrine @ 9:04 AM        Slit Lamp and Fundus Exam    Slit Lamp Exam      Right Left   Lids/Lashes Dermatochalasis - upper lid,  Ptosis Dermatochalasis - upper lid   Conjunctiva/Sclera Nasal and temporal Pinguecula Nasal and temporal Pinguecula   Cornea Arcus, mild EBMD superiorly Arcus, mild EBMD superiorly   Anterior Chamber Moderate depth, clear Moderate depth, clear   Iris Round and dilated, No NVI Round  and dilated, No NVI   Lens 2+ Nuclear sclerosis, 2+ Cortical cataract, Vacuoles 2+ Nuclear sclerosis, 2+ Cortical cataract, trace Posterior subcapsular cataract, Vacuoles   Vitreous Vitreous syneresis Vitreous syneresis       Fundus Exam      Right Left   Disc cupped, pink rim, superior rim thinning cupped, pink rim, superior rim thinning   C/D Ratio 0.7 0.7   Macula Flat, Good foveal reflex, mild Retinal pigment epithelial mottling, No heme or edema Flat, Good foveal reflex, mild Retinal pigment epithelial mottling, No heme or edema   Vessels Copper wiring, mild AV crossing changes Copper wiring, mild AV crossing changes   Periphery Attached, no heme Attached, no heme        Refraction    Wearing Rx      Sphere Cylinder Axis Add   Right +1.50 Sphere  +2.50   Left -0.50 +1.00 027 +2.50   Age:  4 mos   Type:  PAL       Manifest Refraction      Sphere Cylinder Axis Dist VA   Right +1.25 +0.25 120 20/30   Left -0.50 +0.50 180 20/50          IMAGING AND PROCEDURES  Imaging and Procedures for @  OCT, Retina - OU - Both Eyes       Right Eye Quality was good. Central Foveal Thickness: 257. Progression has no prior data. Findings include normal foveal contour, no IRF, no SRF.   Left Eye Quality was good. Central Foveal Thickness: 266. Progression has no prior data. Findings include normal foveal contour, no IRF, no SRF.   Notes *Images captured and stored on drive  Diagnosis / Impression:  No DME OU  Clinical management:  See below  Abbreviations: NFP - Normal foveal profile. CME - cystoid macular edema. PED - pigment epithelial detachment. IRF - intraretinal fluid. SRF - subretinal  fluid. EZ - ellipsoid zone. ERM - epiretinal membrane. ORA - outer retinal atrophy. ORT - outer retinal tubulation. SRHM - subretinal hyper-reflective material                  ASSESSMENT/PLAN:    ICD-10-CM   1. Diabetes mellitus type 2 without retinopathy (HCC) E11.9   2. Retinal edema H35.81 OCT, Retina - OU - Both Eyes  3. Glaucoma suspect of both eyes H40.003   4. Combined forms of age-related cataract of both eyes H25.813     1. Diabetes mellitus, type 2 without retinopathy - The incidence, risk factors for progression, natural history and treatment options for diabetic retinopathy  were discussed with patient.   - The need for close monitoring of blood glucose, blood pressure, and serum lipids, avoiding cigarette or any type of tobacco, and the need for long term follow up was also discussed with patient. - f/u in 1 year, sooner prn  2. No retinal edema on exam or OCT  3. Glaucoma suspect OU-  - IOP 24 OD and 25 OS today - +cupping w/ rim thinning OU - denies family hx of glaucoma - will refer to Dr. Baker Pierini for glaucoma evaluation  4. Combined form age-related cataract OU-  - The symptoms of cataract, surgical options, and treatments and risks were discussed with patient. - discussed diagnosis and progression - not yet visually significant - monitor for now   Ophthalmic Meds Ordered this visit:  No orders of the defined types were placed in this encounter.      Return in  about 1 year (around 08/07/2018) for DM exam, DFE, OCT.  There are no Patient Instructions on file for this visit.   Explained the diagnoses, plan, and follow up with the patient and they expressed understanding.  Patient expressed understanding of the importance of proper follow up care.   This document serves as a record of services personally performed by Karie Chimera, MD, PhD. It was created on their behalf by Laurian Brim, OA, an ophthalmic assistant. The creation of this record  is the provider's dictation and/or activities during the visit.    Electronically signed by: Laurian Brim, OA  08/05/2017 8:39 AM   This document serves as a record of services personally performed by Karie Chimera, MD, PhD. It was created on their behalf by Virgilio Belling, COA, a certified ophthalmic assistant. The creation of this record is the provider's dictation and/or activities during the visit.  Electronically signed by: Virgilio Belling, COA 05.07.19 8:39 AM     Karie Chimera, M.D., Ph.D. Diseases & Surgery of the Retina and Vitreous [facility] 05.07.19  I have reviewed the above documentation for accuracy and completeness, and I agree with the above. Karie Chimera, M.D., Ph.D. 08/07/17 8:40 AM     Abbreviations: M myopia (nearsighted); A astigmatism; H hyperopia (farsighted); P presbyopia; Mrx spectacle prescription;  CTL contact lenses; OD right eye; OS left eye; OU both eyes  XT exotropia; ET esotropia; PEK punctate epithelial keratitis; PEE punctate epithelial erosions; DES dry eye syndrome; MGD meibomian gland dysfunction; ATs artificial tears; PFAT's preservative free artificial tears; NSC nuclear sclerotic cataract; PSC posterior subcapsular cataract; ERM epi-retinal membrane; PVD posterior vitreous detachment; RD retinal detachment; DM diabetes mellitus; DR diabetic retinopathy; NPDR non-proliferative diabetic retinopathy; PDR proliferative diabetic retinopathy; CSME clinically significant macular edema; DME diabetic macular edema; dbh dot blot hemorrhages; CWS cotton wool spot; POAG primary open angle glaucoma; C/D cup-to-disc ratio; HVF humphrey visual field; GVF goldmann visual field; OCT optical coherence tomography; IOP intraocular pressure; BRVO Branch retinal vein occlusion; CRVO central retinal vein occlusion; CRAO central retinal artery occlusion; BRAO branch retinal artery occlusion; RT retinal tear; SB scleral buckle; PPV pars plana  vitrectomy; VH Vitreous hemorrhage; PRP panretinal laser photocoagulation; IVK intravitreal kenalog; VMT vitreomacular traction; MH Macular hole;  NVD neovascularization of the disc; NVE neovascularization elsewhere; AREDS age related eye disease study; ARMD age related macular degeneration; POAG primary open angle glaucoma; EBMD epithelial/anterior basement membrane dystrophy; ACIOL anterior chamber intraocular lens; IOL intraocular lens; PCIOL posterior chamber intraocular lens; Phaco/IOL phacoemulsification with intraocular lens placement; PRK photorefractive keratectomy; LASIK laser assisted in situ keratomileusis; HTN hypertension; DM diabetes mellitus; COPD chronic obstructive pulmonary disease

## 2017-08-06 ENCOUNTER — Encounter (INDEPENDENT_AMBULATORY_CARE_PROVIDER_SITE_OTHER): Payer: Self-pay | Admitting: Ophthalmology

## 2017-08-06 ENCOUNTER — Ambulatory Visit (INDEPENDENT_AMBULATORY_CARE_PROVIDER_SITE_OTHER): Payer: PPO | Admitting: Ophthalmology

## 2017-08-06 DIAGNOSIS — H3581 Retinal edema: Secondary | ICD-10-CM | POA: Diagnosis not present

## 2017-08-06 DIAGNOSIS — H40003 Preglaucoma, unspecified, bilateral: Secondary | ICD-10-CM

## 2017-08-06 DIAGNOSIS — E119 Type 2 diabetes mellitus without complications: Secondary | ICD-10-CM | POA: Diagnosis not present

## 2017-08-06 DIAGNOSIS — H25813 Combined forms of age-related cataract, bilateral: Secondary | ICD-10-CM

## 2017-08-07 ENCOUNTER — Encounter (INDEPENDENT_AMBULATORY_CARE_PROVIDER_SITE_OTHER): Payer: Self-pay | Admitting: Ophthalmology

## 2017-08-13 DIAGNOSIS — H40033 Anatomical narrow angle, bilateral: Secondary | ICD-10-CM | POA: Diagnosis not present

## 2017-08-13 DIAGNOSIS — H401131 Primary open-angle glaucoma, bilateral, mild stage: Secondary | ICD-10-CM | POA: Diagnosis not present

## 2017-08-13 DIAGNOSIS — H40053 Ocular hypertension, bilateral: Secondary | ICD-10-CM | POA: Diagnosis not present

## 2017-09-26 DIAGNOSIS — H401131 Primary open-angle glaucoma, bilateral, mild stage: Secondary | ICD-10-CM | POA: Diagnosis not present

## 2017-09-26 DIAGNOSIS — H40033 Anatomical narrow angle, bilateral: Secondary | ICD-10-CM | POA: Diagnosis not present

## 2017-09-26 DIAGNOSIS — H40053 Ocular hypertension, bilateral: Secondary | ICD-10-CM | POA: Diagnosis not present

## 2017-10-01 DIAGNOSIS — H40052 Ocular hypertension, left eye: Secondary | ICD-10-CM | POA: Diagnosis not present

## 2017-10-01 DIAGNOSIS — H401121 Primary open-angle glaucoma, left eye, mild stage: Secondary | ICD-10-CM | POA: Diagnosis not present

## 2017-10-15 DIAGNOSIS — H40051 Ocular hypertension, right eye: Secondary | ICD-10-CM | POA: Diagnosis not present

## 2017-10-15 DIAGNOSIS — H401111 Primary open-angle glaucoma, right eye, mild stage: Secondary | ICD-10-CM | POA: Diagnosis not present

## 2017-11-25 DIAGNOSIS — E118 Type 2 diabetes mellitus with unspecified complications: Secondary | ICD-10-CM | POA: Diagnosis not present

## 2017-11-25 DIAGNOSIS — I10 Essential (primary) hypertension: Secondary | ICD-10-CM | POA: Diagnosis not present

## 2017-11-25 DIAGNOSIS — Z6830 Body mass index (BMI) 30.0-30.9, adult: Secondary | ICD-10-CM | POA: Diagnosis not present

## 2017-12-03 DIAGNOSIS — H401131 Primary open-angle glaucoma, bilateral, mild stage: Secondary | ICD-10-CM | POA: Diagnosis not present

## 2017-12-03 DIAGNOSIS — H40053 Ocular hypertension, bilateral: Secondary | ICD-10-CM | POA: Diagnosis not present

## 2018-03-24 DIAGNOSIS — E1142 Type 2 diabetes mellitus with diabetic polyneuropathy: Secondary | ICD-10-CM | POA: Diagnosis not present

## 2018-03-24 DIAGNOSIS — I10 Essential (primary) hypertension: Secondary | ICD-10-CM | POA: Diagnosis not present

## 2018-03-24 DIAGNOSIS — Z0000 Encounter for general adult medical examination without abnormal findings: Secondary | ICD-10-CM | POA: Diagnosis not present

## 2018-03-24 DIAGNOSIS — Z6830 Body mass index (BMI) 30.0-30.9, adult: Secondary | ICD-10-CM | POA: Diagnosis not present

## 2018-06-30 DIAGNOSIS — I10 Essential (primary) hypertension: Secondary | ICD-10-CM | POA: Diagnosis not present

## 2018-06-30 DIAGNOSIS — Z6829 Body mass index (BMI) 29.0-29.9, adult: Secondary | ICD-10-CM | POA: Diagnosis not present

## 2018-06-30 DIAGNOSIS — E1165 Type 2 diabetes mellitus with hyperglycemia: Secondary | ICD-10-CM | POA: Diagnosis not present

## 2018-08-07 ENCOUNTER — Encounter (INDEPENDENT_AMBULATORY_CARE_PROVIDER_SITE_OTHER): Payer: PPO | Admitting: Ophthalmology

## 2018-09-08 ENCOUNTER — Encounter (INDEPENDENT_AMBULATORY_CARE_PROVIDER_SITE_OTHER): Payer: Self-pay | Admitting: Ophthalmology

## 2018-11-03 DIAGNOSIS — I10 Essential (primary) hypertension: Secondary | ICD-10-CM | POA: Diagnosis not present

## 2018-11-03 DIAGNOSIS — E1169 Type 2 diabetes mellitus with other specified complication: Secondary | ICD-10-CM | POA: Diagnosis not present

## 2019-01-27 DIAGNOSIS — E1169 Type 2 diabetes mellitus with other specified complication: Secondary | ICD-10-CM | POA: Diagnosis not present

## 2019-01-27 DIAGNOSIS — E1142 Type 2 diabetes mellitus with diabetic polyneuropathy: Secondary | ICD-10-CM | POA: Diagnosis not present

## 2019-01-27 DIAGNOSIS — I10 Essential (primary) hypertension: Secondary | ICD-10-CM | POA: Diagnosis not present

## 2019-01-27 DIAGNOSIS — M545 Low back pain: Secondary | ICD-10-CM | POA: Diagnosis not present

## 2019-01-27 DIAGNOSIS — E1165 Type 2 diabetes mellitus with hyperglycemia: Secondary | ICD-10-CM | POA: Diagnosis not present

## 2019-03-09 DIAGNOSIS — I10 Essential (primary) hypertension: Secondary | ICD-10-CM | POA: Diagnosis not present

## 2019-03-09 DIAGNOSIS — R000 Tachycardia, unspecified: Secondary | ICD-10-CM | POA: Diagnosis not present

## 2019-03-09 DIAGNOSIS — E1169 Type 2 diabetes mellitus with other specified complication: Secondary | ICD-10-CM | POA: Diagnosis not present

## 2019-04-06 DIAGNOSIS — Z20828 Contact with and (suspected) exposure to other viral communicable diseases: Secondary | ICD-10-CM | POA: Diagnosis not present

## 2019-05-03 DIAGNOSIS — Z20828 Contact with and (suspected) exposure to other viral communicable diseases: Secondary | ICD-10-CM | POA: Diagnosis not present

## 2019-05-10 DIAGNOSIS — Z20828 Contact with and (suspected) exposure to other viral communicable diseases: Secondary | ICD-10-CM | POA: Diagnosis not present

## 2019-06-19 ENCOUNTER — Other Ambulatory Visit: Payer: Self-pay | Admitting: Family Medicine

## 2019-06-19 DIAGNOSIS — M545 Low back pain, unspecified: Secondary | ICD-10-CM

## 2019-07-08 ENCOUNTER — Other Ambulatory Visit: Payer: Self-pay

## 2019-07-08 ENCOUNTER — Ambulatory Visit
Admission: RE | Admit: 2019-07-08 | Discharge: 2019-07-08 | Disposition: A | Discharge status: Home / Self Care | Payer: Medicare Other | Source: Ambulatory Visit | Attending: Family Medicine | Admitting: Family Medicine

## 2019-07-08 DIAGNOSIS — M545 Low back pain, unspecified: Secondary | ICD-10-CM

## 2019-07-08 DIAGNOSIS — M5126 Other intervertebral disc displacement, lumbar region: Secondary | ICD-10-CM | POA: Diagnosis not present

## 2019-07-08 DIAGNOSIS — M48061 Spinal stenosis, lumbar region without neurogenic claudication: Secondary | ICD-10-CM | POA: Diagnosis not present

## 2019-07-08 MED ORDER — GADOBENATE DIMEGLUMINE 529 MG/ML IV SOLN
17.0000 mL | Freq: Once | INTRAVENOUS | Status: AC | PRN
  Administered 2019-07-08: 17 mL via INTRAVENOUS

## 2019-08-10 DIAGNOSIS — Z634 Disappearance and death of family member: Secondary | ICD-10-CM | POA: Diagnosis not present

## 2019-08-10 DIAGNOSIS — E785 Hyperlipidemia, unspecified: Secondary | ICD-10-CM | POA: Diagnosis not present

## 2019-08-10 DIAGNOSIS — I10 Essential (primary) hypertension: Secondary | ICD-10-CM | POA: Diagnosis not present

## 2019-08-10 DIAGNOSIS — E1169 Type 2 diabetes mellitus with other specified complication: Secondary | ICD-10-CM | POA: Diagnosis not present

## 2019-12-14 DIAGNOSIS — E1142 Type 2 diabetes mellitus with diabetic polyneuropathy: Secondary | ICD-10-CM | POA: Diagnosis not present

## 2019-12-14 DIAGNOSIS — I10 Essential (primary) hypertension: Secondary | ICD-10-CM | POA: Diagnosis not present

## 2019-12-14 DIAGNOSIS — E1169 Type 2 diabetes mellitus with other specified complication: Secondary | ICD-10-CM | POA: Diagnosis not present

## 2019-12-14 DIAGNOSIS — Z23 Encounter for immunization: Secondary | ICD-10-CM | POA: Diagnosis not present

## 2020-03-14 DIAGNOSIS — E1142 Type 2 diabetes mellitus with diabetic polyneuropathy: Secondary | ICD-10-CM | POA: Diagnosis not present

## 2020-03-14 DIAGNOSIS — I10 Essential (primary) hypertension: Secondary | ICD-10-CM | POA: Diagnosis not present

## 2020-03-14 DIAGNOSIS — E1169 Type 2 diabetes mellitus with other specified complication: Secondary | ICD-10-CM | POA: Diagnosis not present

## 2020-03-14 DIAGNOSIS — E785 Hyperlipidemia, unspecified: Secondary | ICD-10-CM | POA: Diagnosis not present

## 2020-05-30 DIAGNOSIS — I10 Essential (primary) hypertension: Secondary | ICD-10-CM | POA: Diagnosis not present

## 2020-05-30 DIAGNOSIS — E785 Hyperlipidemia, unspecified: Secondary | ICD-10-CM | POA: Diagnosis not present

## 2020-06-14 DIAGNOSIS — E785 Hyperlipidemia, unspecified: Secondary | ICD-10-CM | POA: Diagnosis not present

## 2020-06-14 DIAGNOSIS — E1169 Type 2 diabetes mellitus with other specified complication: Secondary | ICD-10-CM | POA: Diagnosis not present

## 2020-06-14 DIAGNOSIS — E1142 Type 2 diabetes mellitus with diabetic polyneuropathy: Secondary | ICD-10-CM | POA: Diagnosis not present

## 2020-06-14 DIAGNOSIS — Z125 Encounter for screening for malignant neoplasm of prostate: Secondary | ICD-10-CM | POA: Diagnosis not present

## 2020-06-14 DIAGNOSIS — I10 Essential (primary) hypertension: Secondary | ICD-10-CM | POA: Diagnosis not present

## 2020-10-10 DIAGNOSIS — Z794 Long term (current) use of insulin: Secondary | ICD-10-CM | POA: Diagnosis not present

## 2020-10-10 DIAGNOSIS — E785 Hyperlipidemia, unspecified: Secondary | ICD-10-CM | POA: Diagnosis not present

## 2020-10-10 DIAGNOSIS — E1169 Type 2 diabetes mellitus with other specified complication: Secondary | ICD-10-CM | POA: Diagnosis not present

## 2020-10-10 DIAGNOSIS — I10 Essential (primary) hypertension: Secondary | ICD-10-CM | POA: Diagnosis not present

## 2020-10-10 DIAGNOSIS — R21 Rash and other nonspecific skin eruption: Secondary | ICD-10-CM | POA: Diagnosis not present

## 2020-10-10 DIAGNOSIS — E11620 Type 2 diabetes mellitus with diabetic dermatitis: Secondary | ICD-10-CM | POA: Diagnosis not present

## 2020-10-30 DIAGNOSIS — E1165 Type 2 diabetes mellitus with hyperglycemia: Secondary | ICD-10-CM | POA: Diagnosis not present

## 2020-10-30 DIAGNOSIS — I10 Essential (primary) hypertension: Secondary | ICD-10-CM | POA: Diagnosis not present

## 2020-10-30 DIAGNOSIS — E785 Hyperlipidemia, unspecified: Secondary | ICD-10-CM | POA: Diagnosis not present

## 2020-12-08 ENCOUNTER — Emergency Department (HOSPITAL_COMMUNITY): Payer: Medicare Other

## 2020-12-08 ENCOUNTER — Encounter (HOSPITAL_COMMUNITY): Payer: Self-pay

## 2020-12-08 ENCOUNTER — Other Ambulatory Visit: Payer: Self-pay

## 2020-12-08 ENCOUNTER — Emergency Department (HOSPITAL_COMMUNITY)
Admission: EM | Admit: 2020-12-08 | Discharge: 2020-12-08 | Disposition: A | Discharge status: Home / Self Care | Payer: Medicare Other | Attending: Emergency Medicine | Admitting: Emergency Medicine

## 2020-12-08 DIAGNOSIS — M47816 Spondylosis without myelopathy or radiculopathy, lumbar region: Secondary | ICD-10-CM | POA: Diagnosis not present

## 2020-12-08 DIAGNOSIS — R0602 Shortness of breath: Secondary | ICD-10-CM | POA: Diagnosis not present

## 2020-12-08 DIAGNOSIS — Z7982 Long term (current) use of aspirin: Secondary | ICD-10-CM | POA: Diagnosis not present

## 2020-12-08 DIAGNOSIS — M5126 Other intervertebral disc displacement, lumbar region: Secondary | ICD-10-CM | POA: Diagnosis not present

## 2020-12-08 DIAGNOSIS — R519 Headache, unspecified: Secondary | ICD-10-CM | POA: Insufficient documentation

## 2020-12-08 DIAGNOSIS — Z96652 Presence of left artificial knee joint: Secondary | ICD-10-CM | POA: Insufficient documentation

## 2020-12-08 DIAGNOSIS — R531 Weakness: Secondary | ICD-10-CM | POA: Insufficient documentation

## 2020-12-08 DIAGNOSIS — E119 Type 2 diabetes mellitus without complications: Secondary | ICD-10-CM | POA: Insufficient documentation

## 2020-12-08 DIAGNOSIS — R42 Dizziness and giddiness: Secondary | ICD-10-CM | POA: Insufficient documentation

## 2020-12-08 DIAGNOSIS — I517 Cardiomegaly: Secondary | ICD-10-CM | POA: Diagnosis not present

## 2020-12-08 DIAGNOSIS — Z794 Long term (current) use of insulin: Secondary | ICD-10-CM | POA: Diagnosis not present

## 2020-12-08 DIAGNOSIS — Z7984 Long term (current) use of oral hypoglycemic drugs: Secondary | ICD-10-CM | POA: Insufficient documentation

## 2020-12-08 DIAGNOSIS — I10 Essential (primary) hypertension: Secondary | ICD-10-CM | POA: Insufficient documentation

## 2020-12-08 LAB — COMPREHENSIVE METABOLIC PANEL
ALT: 15 U/L (ref 0–44)
AST: 21 U/L (ref 15–41)
Albumin: 3.7 g/dL (ref 3.5–5.0)
Alkaline Phosphatase: 60 U/L (ref 38–126)
Anion gap: 8 (ref 5–15)
BUN: 33 mg/dL — ABNORMAL HIGH (ref 8–23)
CO2: 23 mmol/L (ref 22–32)
Calcium: 10 mg/dL (ref 8.9–10.3)
Chloride: 110 mmol/L (ref 98–111)
Creatinine, Ser: 1.24 mg/dL (ref 0.61–1.24)
GFR, Estimated: >60 mL/min (ref >60)
Glucose, Bld: 171 mg/dL — ABNORMAL HIGH (ref 70–99)
Potassium: 4.3 mmol/L (ref 3.5–5.1)
Sodium: 141 mmol/L (ref 135–145)
Total Bilirubin: 0.3 mg/dL (ref 0.3–1.2)
Total Protein: 8.3 g/dL — ABNORMAL HIGH (ref 6.5–8.1)

## 2020-12-08 LAB — CBC WITH DIFFERENTIAL/PLATELET
Abs Immature Granulocytes: 0.03 10*3/uL (ref 0.00–0.07)
Basophils Absolute: 0.1 10*3/uL (ref 0.0–0.1)
Basophils Relative: 1 %
Eosinophils Absolute: 0.2 10*3/uL (ref 0.0–0.5)
Eosinophils Relative: 2 %
HCT: 35.1 % — ABNORMAL LOW (ref 39.0–52.0)
Hemoglobin: 10.7 g/dL — ABNORMAL LOW (ref 13.0–17.0)
Immature Granulocytes: 0 %
Lymphocytes Relative: 32 %
Lymphs Abs: 3 10*3/uL (ref 0.7–4.0)
MCH: 24.3 pg — ABNORMAL LOW (ref 26.0–34.0)
MCHC: 30.5 g/dL (ref 30.0–36.0)
MCV: 79.8 fL — ABNORMAL LOW (ref 80.0–100.0)
Monocytes Absolute: 0.7 10*3/uL (ref 0.1–1.0)
Monocytes Relative: 7 %
Neutro Abs: 5.5 10*3/uL (ref 1.7–7.7)
Neutrophils Relative %: 58 %
Platelets: 397 10*3/uL (ref 150–400)
RBC: 4.4 MIL/uL (ref 4.22–5.81)
RDW: 14.6 % (ref 11.5–15.5)
WBC: 9.4 10*3/uL (ref 4.0–10.5)
nRBC: 0 % (ref 0.0–0.2)

## 2020-12-08 MED ORDER — SODIUM CHLORIDE 0.9 % IV BOLUS
1000.0000 mL | Freq: Once | INTRAVENOUS | Status: AC
  Administered 2020-12-08: 1000 mL via INTRAVENOUS

## 2020-12-08 MED ORDER — LORAZEPAM 2 MG/ML IJ SOLN
1.0000 mg | Freq: Once | INTRAMUSCULAR | Status: AC
  Administered 2020-12-08: 1 mg via INTRAVENOUS
  Filled 2020-12-08: qty 1

## 2020-12-08 NOTE — ED Provider Notes (Signed)
Raymond Munoz Provider Note   CSN: [id number] Arrival date & time: 12/08/20  1020     History Chief Complaint  Patient presents with   Dizziness   Headache   Shortness of Breath    Raymond KarvonenHugh T Eckhart Jr. is a 70 y.o. male.  Patient states he has had weakness in his legs for a few months now.  No fever no chills no cough.  The history is provided by the patient and medical records. No language interpreter was used.  Weakness Severity:  Moderate Onset quality:  Gradual Duration:  12 weeks Timing:  Constant Progression:  Waxing and waning Chronicity:  Chronic Context: not alcohol use   Relieved by:  Nothing Worsened by:  Nothing Ineffective treatments:  None tried Associated symptoms: no abdominal pain, no chest pain, no cough, no diarrhea, no frequency, no headaches and no seizures       Past Medical History:  Diagnosis Date   Anxiety    Arthritis    Diabetes mellitus without complication (HCC)    Type 2   GERD (gastroesophageal reflux disease)    Hypertension     Patient Active Problem List   Diagnosis Date Noted   SKIN LESION 05/11/2008   ANEMIA-IRON DEFICIENCY 01/02/2008   ASTHMA 01/02/2008   DIABETES MELLITUS, TYPE II 05/05/2007   HYPERLIPIDEMIA 05/05/2007   HYPERTENSION 05/05/2007   ERECTILE DYSFUNCTION 10/07/2006   GERD 09/12/2006   COLONIC POLYPS, HX OF 09/12/2006    Past Surgical History:  Procedure Laterality Date   KNEE SURGERY Left    RADIOLOGY WITH ANESTHESIA N/A 08/26/2014   Procedure: MRI LUMBAR SPINE   (RADIOLOGY WITH ANESTHESIA);  Surgeon: Medication Radiologist, MD;  Location: [facility] OR;  Service: Radiology;  Laterality: N/A;       Family History  Problem Relation Age of Onset   Hypertension Mother    Diabetes type II Mother    Thyroid disease Mother     Social History   Tobacco Use   Smoking status: Never   Smokeless tobacco: Never  Vaping Use   Vaping Use: Never used  Substance Use Topics    Alcohol use: No   Drug use: No    Home Medications Prior to Admission medications   Medication Sig Start Date End Date Taking? Authorizing Provider  acetaminophen (TYLENOL 8 HOUR) 650 MG CR tablet Take 1 tablet (650 mg total) by mouth every 8 (eight) hours as needed for pain. 05/17/14   Toy Cookeyocherty, Megan, MD  aspirin 81 MG chewable tablet Chew 81 mg by mouth daily.    [provider]  diphenhydrAMINE (SOMINEX) 25 MG tablet Take 25 mg by mouth at bedtime.     [provider]  fexofenadine (ALLEGRA) 180 MG tablet Take 180 mg by mouth daily.    [provider]  gabapentin (NEURONTIN) 300 MG capsule Take 300-600 mg by mouth 2 (two) times daily. 300mg  in the morning and 600mg  at bedtime    [provider]  gemfibrozil (LOPID) 600 MG tablet Take 600 mg by mouth 2 (two) times daily before a meal.  01/23/14   [provider]  glucose blood (FREESTYLE LITE) test strip Use as instructed 10/16/14   Raymond Berryapia, Leisa, PA-C  hydrochlorothiazide (HYDRODIURIL) 25 MG tablet Take 25 mg by mouth daily.  02/26/14   [provider]  HYDROcodone-acetaminophen (NORCO/VICODIN) 5-325 MG per tablet Take 1-2 tablets by mouth every 6 (six) hours as needed for moderate pain or severe pain. 03/07/14   Raymond ManoMintz,  Joe, PA-C  Insulin Glargine (LANTUS SOLOSTAR) 100 UNIT/ML Solostar Pen Inject 70 Units into the skin at bedtime. 10/16/14   Raymond Berry, PA-C  Lancets (FREESTYLE) lancets Use as instructed 10/16/14   Raymond Berry, PA-C  metFORMIN (GLUCOPHAGE) 1000 MG tablet Take 1 tablet (1,000 mg total) by mouth daily with breakfast. 10/16/14   Raymond Berry, PA-C  Omega 3 1200 MG CAPS Take 2,400 mg by mouth 2 (two) times daily.    [provider]  omeprazole (PRILOSEC OTC) 20 MG tablet Take 20 mg by mouth daily.    [provider]  vitamin C (ASCORBIC ACID) 500 MG tablet Take 500 mg by mouth daily.    [provider]    Allergies    Aspirin, Codeine, and  Ibuprofen  Review of Systems   Review of Systems  Constitutional:  Negative for appetite change and fatigue.  HENT:  Negative for congestion, ear discharge and sinus pressure.   Eyes:  Negative for discharge.  Respiratory:  Negative for cough.   Cardiovascular:  Negative for chest pain.  Gastrointestinal:  Negative for abdominal pain and diarrhea.  Genitourinary:  Negative for frequency and hematuria.  Musculoskeletal:  Negative for back pain.  Skin:  Negative for rash.  Neurological:  Positive for weakness. Negative for seizures and headaches.  Psychiatric/Behavioral:  Negative for hallucinations.    Physical Exam Updated Vital Signs BP (!) 143/94 (BP Location: Left Arm)   Pulse 78   Temp 98.5 F (36.9 C) (Oral)   Resp 16   Ht 5\' 7"  (1.702 m)   Wt 87 kg   SpO2 100%   BMI 30.04 kg/m   Physical Exam Vitals and nursing note reviewed.  Constitutional:      Appearance: He is well-developed.  HENT:     Head: Normocephalic.  Eyes:     General: No scleral icterus.    Conjunctiva/sclera: Conjunctivae normal.  Neck:     Thyroid: No thyromegaly.  Cardiovascular:     Rate and Rhythm: Normal rate and regular rhythm.     Heart sounds: No murmur heard.   No friction rub. No gallop.  Pulmonary:     Breath sounds: No stridor. No wheezing or rales.  Chest:     Chest wall: No tenderness.  Abdominal:     General: There is no distension.     Tenderness: There is no abdominal tenderness. There is no rebound.  Musculoskeletal:     Cervical back: Neck supple.     Comments: Patient able to ambulate without assistance.  He is walking very slowly and feels slightly unsteady.  He has been like this for months  Lymphadenopathy:     Cervical: No cervical adenopathy.  Skin:    Findings: No erythema or rash.  Neurological:     Mental Status: He is alert and oriented to person, place, and time.     Motor: No abnormal muscle tone.     Coordination: Coordination normal.  Psychiatric:         Behavior: Behavior normal.    ED Results / Procedures / Treatments   Labs (all labs ordered are listed, but only abnormal results are displayed) Labs Reviewed  CBC WITH DIFFERENTIAL/PLATELET - Abnormal; Notable for the following components:      Result Value   Hemoglobin 10.7 (*)    HCT 35.1 (*)    MCV 79.8 (*)    MCH 24.3 (*)    All other components within normal limits  COMPREHENSIVE METABOLIC PANEL -  Abnormal; Notable for the following components:   Glucose, Bld 171 (*)    BUN 33 (*)    Total Protein 8.3 (*)    All other components within normal limits    EKG None  Radiology DG Chest 2 View  Result Date: 12/08/2020 CLINICAL DATA:  Weak, shortness of breath EXAM: CHEST - 2 VIEW COMPARISON:  11/14/2009 FINDINGS: Cardiomegaly. Heterogeneous airspace opacity at the left lung base. Mild elevation of the left hemidiaphragm. Osseous structures are unremarkable. IMPRESSION: 1. Heterogeneous airspace opacity at the left lung base, concerning for infection. 2. Cardiomegaly. Electronically Signed   By: Lauralyn Primes M.D.   On: 12/08/2020 11:53   DG Lumbar Spine Complete  Result Date: 12/08/2020 CLINICAL DATA:  Weakness. EXAM: LUMBAR SPINE - COMPLETE 4+ VIEW COMPARISON:  MRI lumbar spine dated July 08, 2019. FINDINGS: Five lumbar type vertebral bodies. No acute fracture or subluxation. Vertebral body heights are preserved. Alignment is normal. Unchanged mild multilevel endplate spurring and facet arthropathy. Intervertebral disc spaces are relatively maintained. The sacroiliac joints are unremarkable. IMPRESSION: 1. Unchanged mild multilevel lumbar spondylosis. Electronically Signed   By: Obie Dredge M.D.   On: 12/08/2020 11:53   CT HEAD WO CONTRAST ( )  Result Date: 12/08/2020 CLINICAL DATA:  Dizziness and weakness. EXAM: CT HEAD WITHOUT CONTRAST TECHNIQUE: Contiguous axial images were obtained from the base of the skull through the vertex without intravenous contrast. COMPARISON:   None. FINDINGS: Brain: Mild age related cerebral atrophy, ventriculomegaly and periventricular white matter disease. No extra-axial fluid collections are identified. No CT findings for acute hemispheric infarction or intracranial hemorrhage. No mass lesions. The brainstem and cerebellum are normal. Vascular: Scattered vascular calcifications. No aneurysm or hyperdense vessels. Skull: No skull fracture or bone lesions. Sinuses/Orbits: The paranasal sinuses and mastoid air cells are clear. Globes are intact. Other: No scalp lesions or hematoma. IMPRESSION: 1. Mild age related cerebral atrophy, ventriculomegaly and periventricular white matter disease. 2. No acute intracranial findings or mass lesions. Electronically Signed   By: Rudie Meyer M.D.   On: 12/08/2020 12:53   MR LUMBAR SPINE WO CONTRAST  Result Date: 12/08/2020 CLINICAL DATA:  Low back pain and weakness. EXAM: MRI LUMBAR SPINE WITHOUT CONTRAST TECHNIQUE: Multiplanar, multisequence MR imaging of the lumbar spine was performed. No intravenous contrast was administered. COMPARISON:  Lumbar spine x-rays from same day. MRI lumbar spine dated seventh 2021. FINDINGS: Segmentation:  Standard. Alignment:  Physiologic. Vertebrae:  No fracture, evidence of discitis, or bone lesion. Conus medullaris and cauda equina: Conus extends to the L1-L2 level. Conus and cauda equina appear normal. Paraspinal and other soft tissues: Small bilateral renal cysts. Otherwise negative. Disc levels: T12-L1:  Negative. L1-L2:  Negative. L2-L3:  Unchanged mild disc bulging.  No stenosis. L3-L4: Unchanged mild foraminal disc bulging encroaching on the exiting L3 nerve roots. Right foraminal annular fissure. Unchanged mild spinal canal stenosis. No neuroforaminal stenosis. L4-L5: Unchanged moderate disc bulging asymmetric to left encroaching on the exiting L4 nerve roots. Unchanged mild facet arthropathy. Prominent epidural fat contributes to moderate spinal canal and left greater  than right lateral recess stenosis. Unchanged mild bilateral neuroforaminal stenosis. L5-S1: Unchanged small broad-based posterior disc protrusion and mild-to-moderate bilateral facet arthropathy. No stenosis. IMPRESSION: 1. Multilevel lumbar spondylosis as described above, overall similar to prior study. Unchanged moderate spinal canal stenosis at L4-L5. Electronically Signed   By: Obie Dredge M.D.   On: 12/08/2020 15:21    Procedures Procedures   Medications Ordered in ED Medications  sodium chloride  0.9 % bolus 1,000 mL (1,000 mLs Intravenous New Bag/Given 12/08/20 1153)  LORazepam (ATIVAN) injection 1 mg (1 mg Intravenous Given 12/08/20 1437)    ED Course  I have reviewed the triage vital signs and the nursing notes.  Pertinent labs & imaging results that were available during my care of the patient were reviewed by me and considered in my medical decision making (see chart for details).    MDM Rules/Calculators/A&P                           Patient with MRI showing spinal stenosis.  MRI does not show any worsening from previous study.  Patient had dehydration and was hydrated with IV fluids.  He is referred back to neurosurgery for the spinal stenosis Final Clinical Impression(s) / ED Diagnoses Final diagnoses:  Weakness    Rx / DC Orders ED Discharge Orders     None        Bethann Berkshire, MD 12/10/20 1256

## 2020-12-08 NOTE — Discharge Instructions (Addendum)
Follow-up with Dr. Jenkins or one of his colleagues in the next couple weeks °

## 2020-12-08 NOTE — ED Notes (Signed)
Pt to MRI at this time.

## 2020-12-08 NOTE — ED Notes (Signed)
Provider at the bedside.  

## 2020-12-08 NOTE — ED Triage Notes (Signed)
Shob, headache, dizziness, tightness in back of neck, and weakness from waist down. Patient ambulatory to room.

## 2020-12-08 NOTE — ED Notes (Signed)
Pt to CT at this time.

## 2020-12-30 DIAGNOSIS — E785 Hyperlipidemia, unspecified: Secondary | ICD-10-CM | POA: Diagnosis not present

## 2020-12-30 DIAGNOSIS — I10 Essential (primary) hypertension: Secondary | ICD-10-CM | POA: Diagnosis not present

## 2020-12-30 DIAGNOSIS — E1165 Type 2 diabetes mellitus with hyperglycemia: Secondary | ICD-10-CM | POA: Diagnosis not present

## 2021-03-01 DIAGNOSIS — E785 Hyperlipidemia, unspecified: Secondary | ICD-10-CM | POA: Diagnosis not present

## 2021-03-01 DIAGNOSIS — E1165 Type 2 diabetes mellitus with hyperglycemia: Secondary | ICD-10-CM | POA: Diagnosis not present

## 2021-03-01 DIAGNOSIS — I10 Essential (primary) hypertension: Secondary | ICD-10-CM | POA: Diagnosis not present

## 2021-03-13 NOTE — Progress Notes (Signed)
[facility] - Clinic Note  03/17/2021     CHIEF COMPLAINT Patient presents for Diabetic Eye Exam    HISTORY OF PRESENT ILLNESS: Raymond Munoz. is a 70 y.o. male who presents to the clinic today for:   HPI     Diabetic Eye Exam   Vision is blurred for near and is blurred for distance.  Associated Symptoms Negative for Flashes, Floaters, Distortion, Blind Spot, Pain, Redness, Photophobia, Glare, Scalp Tenderness, Jaw Claudication, Fever and Weight Loss.  Diabetes characteristics include Type 2, on insulin and taking oral medications.  This started 1 year ago.  Last Blood Glucose 215 (This am).  I, the attending physician,  performed the HPI with the patient and updated documentation appropriately.        Comments   Patient states vision blurred OU for the past year. Patient has difficulty with with driving at night. Glare from headlights bothers patient. BS was 215 this am. Doesn't remember last a1c. Patient is on insulin and metformin for diabetes control.       Last edited by [name], MD on 03/17/2021  1:10 PM.     Pt has not been seen in 3 years, pt saw Dr. Alben Spittle for awhile, but then stopped seeing him, pt is not on any drops of glaucoma right now  Referring physician: Diona Foley, MD [address] [place],  [place] [address]  HISTORICAL INFORMATION:   Selected notes from the MEDICAL RECORD NUMBER Referred by Dr. [name] for DM exam LEE:  Ocular Hx- PMH-DM (on Janumet, Lantus and Metformin), HTN, arthritis    CURRENT MEDICATIONS: No current outpatient medications on file. (Ophthalmic Drugs)   No current facility-administered medications for this visit. (Ophthalmic Drugs)   Current Outpatient Medications (Other)  Medication Sig   acetaminophen (TYLENOL 8 HOUR) 650 MG CR tablet Take 1 tablet (650 mg total) by mouth every 8 (eight) hours as needed for pain.   amLODipine-benazepril (LOTREL) 5-40 MG capsule Take 1 capsule by  mouth daily.   aspirin 81 MG chewable tablet Chew 81 mg by mouth daily.   diphenhydrAMINE (SOMINEX) 25 MG tablet Take 25 mg by mouth at bedtime.    fexofenadine (ALLEGRA) 180 MG tablet Take 180 mg by mouth daily.   gabapentin (NEURONTIN) 300 MG capsule Take 300-600 mg by mouth 2 (two) times daily. 300mg  in the morning and 600mg  at bedtime   gemfibrozil (LOPID) 600 MG tablet Take 600 mg by mouth 2 (two) times daily before a meal.    glucose blood (FREESTYLE LITE) test strip Use as instructed   hydrochlorothiazide (HYDRODIURIL) 25 MG tablet Take 25 mg by mouth daily.    Insulin Glargine (LANTUS SOLOSTAR) 100 UNIT/ML Solostar Pen Inject 70 Units into the skin at bedtime.   Lancets (FREESTYLE) lancets Use as instructed   metFORMIN (GLUCOPHAGE) 1000 MG tablet Take 1 tablet (1,000 mg total) by mouth daily with breakfast.   Omega 3 1200 MG CAPS Take 2,400 mg by mouth 2 (two) times daily.   omeprazole (PRILOSEC OTC) 20 MG tablet Take 20 mg by mouth daily.   vitamin C (ASCORBIC ACID) 500 MG tablet Take 500 mg by mouth daily.   HYDROcodone-acetaminophen (NORCO/VICODIN) 5-325 MG per tablet Take 1-2 tablets by mouth every 6 (six) hours as needed for moderate pain or severe pain. (Patient not taking: Reported on 03/17/2021)   No current facility-administered medications for this visit. (Other)   REVIEW OF SYSTEMS: ROS   Positive for:  Endocrine, Eyes Negative for: Constitutional, Gastrointestinal, Neurological, Skin, Genitourinary, Musculoskeletal, HENT, Cardiovascular, Respiratory, Psychiatric, Allergic/Imm, Heme/Lymph Last edited by Annalee Genta D, COT on 03/17/2021  8:47 AM.     ALLERGIES Allergies  Allergen Reactions   Aspirin     Irritates stomach-can take 81 mg   Codeine     unknown   Ibuprofen     REACTION: unspecified    PAST MEDICAL HISTORY Past Medical History:  Diagnosis Date   Anxiety    Arthritis    Diabetes mellitus without complication (HCC)    Type 2   GERD  (gastroesophageal reflux disease)    Hypertension    Past Surgical History:  Procedure Laterality Date   KNEE SURGERY Left    RADIOLOGY WITH ANESTHESIA N/A 08/26/2014   Procedure: MRI LUMBAR SPINE   (RADIOLOGY WITH ANESTHESIA);  Surgeon: Medication Radiologist, MD;  Location: [facility] OR;  Service: Radiology;  Laterality: N/A;    FAMILY HISTORY Family History  Problem Relation Age of Onset   Hypertension Mother    Diabetes type II Mother    Thyroid disease Mother    SOCIAL HISTORY Social History   Tobacco Use   Smoking status: Never   Smokeless tobacco: Never  Vaping Use   Vaping Use: Never used  Substance Use Topics   Alcohol use: No   Drug use: No       OPHTHALMIC EXAM:   Base Eye Exam     Visual Acuity (Snellen - Linear)       Right Left   Dist cc 20/40 20/70 -2   Dist ph cc NI 20/60 -3    Correction: Glasses         Tonometry (Tonopen, 9:03 AM)       Right Left   Pressure 22 24         Pupils       Dark Light Shape React APD   Right 3 2 Round Brisk None   Left 3 2 Round Brisk None         Visual Fields (Counting fingers)       Left Right    Full Full         Extraocular Movement       Right Left    Full, Ortho Full, Ortho         Neuro/Psych     Oriented x3: Yes   Mood/Affect: Normal         Dilation     Both eyes: 1.0% Mydriacyl, 2.5% Phenylephrine @ 9:03 AM           Slit Lamp and Fundus Exam     Slit Lamp Exam       Right Left   Lids/Lashes Dermatochalasis - upper lid, Ptosis Dermatochalasis - upper lid   Conjunctiva/Sclera Nasal and temporal Pinguecula, mild melanosis Nasal and temporal Pinguecula   Cornea Arcus, trace PEE Arcus, 1-2+ fine Punctate epithelial erosions   Anterior Chamber Moderate depth, clear Moderate depth, clear   Iris Round and dilated, No NVI Round and dilated, No NVI   Lens 2-3+ Nuclear sclerosis, 2-3+ Cortical cataract, Vacuoles 2+ Nuclear sclerosis with early brunescnece, 2+ Cortical  cataract, trace Posterior subcapsular cataract   Anterior Vitreous Vitreous syneresis Vitreous syneresis         Fundus Exam       Right Left   Disc cupped, pink rim, superior rim thinning cupped, pink rim, superior rim thinning   C/D Ratio 0.7 0.7   Macula Flat,  Good foveal reflex, mild Retinal pigment epithelial mottling, No heme or edema Flat, Good foveal reflex, mild Retinal pigment epithelial mottling, No heme or edema   Vessels Copper wiring, mild AV crossing changes Copper wiring, mild AV crossing changes   Periphery Attached, no heme Attached, no heme           Refraction     Wearing Rx       Sphere Cylinder Axis Add   Right [place] +0.50 093 +2.50   Left -0.25 Sphere  +2.50    Type: PAL         Manifest Refraction       Sphere Cylinder Axis Dist VA   Right +0.25 +0.50 175 20/40   Left -1.50 +0.50 177 20/60            IMAGING AND PROCEDURES  Imaging and Procedures for @TODAY @  OCT, Retina - OU - Both Eyes       Right Eye Quality was good. Central Foveal Thickness: 259. Progression has been stable. Findings include normal foveal contour, no IRF, no SRF, vitreomacular adhesion .   Left Eye Quality was good. Central Foveal Thickness: 266. Progression has been stable. Findings include normal foveal contour, no IRF, no SRF.   Notes *Images captured and stored on drive  Diagnosis / Impression:  No DME OU  Clinical management:  See below  Abbreviations: NFP - Normal foveal profile. CME - cystoid macular edema. PED - pigment epithelial detachment. IRF - intraretinal fluid. SRF - subretinal fluid. EZ - ellipsoid zone. ERM - epiretinal membrane. ORA - outer retinal atrophy. ORT - outer retinal tubulation. SRHM - subretinal hyper-reflective material             ASSESSMENT/PLAN:    ICD-10-CM   1. Diabetes mellitus type 2 without retinopathy (HCC)  E11.9 OCT, Retina - OU - Both Eyes    2. Primary open angle glaucoma of both eyes, unspecified  glaucoma stage  H40.1130     3. Bilateral ocular hypertension  H40.053     4. Combined forms of age-related cataract of both eyes  H25.813      1. Diabetes mellitus, type 2 without retinopathy  - pt lost to f/u since May 2019 - The incidence, risk factors for progression, natural history and treatment options for diabetic retinopathy  were discussed with patient.   - The need for close monitoring of blood glucose, blood pressure, and serum lipids, avoiding cigarette or any type of tobacco, and the need for long term follow up was also discussed with patient.  - f/u in 1 year, sooner prn  2,3. POAG / Ocular hypertension OU  - IOP 22 OD and 24 OS today - +cupping w/ rim thinning OU - denies family hx of glaucoma - pt was referred to and had been seeing Dr. June 2019 in 2019 but has been lost to f/u and has run out of his glaucoma drops - will refer to Dr. 2020 for re-establishment of glaucoma care  4. Combined form age-related cataract OU-  - The symptoms of cataract, surgical options, and treatments and risks were discussed with patient. - discussed diagnosis and progression - approaching visual significance - will refer to Dr. [name] for cat eval  Ophthalmic Meds Ordered this visit:  No orders of the defined types were placed in this encounter.    Return in about 1 year (around 03/17/2022) for f/u DM exam, DFE, OCT.  There are no Patient Instructions on file for this visit.  Explained the diagnoses, plan, and follow up with the patient and they expressed understanding.  Patient expressed understanding of the importance of proper follow up care.   This document serves as a record of services personally performed by Karie Chimera, MD, PhD. It was created on their behalf by Joni Reining, an ophthalmic technician. The creation of this record is the provider's dictation and/or activities during the visit.    Electronically signed by: Joni Reining COA, 03/17/21  1:17 PM  This document  serves as a record of services personally performed by Karie Chimera, MD, PhD. It was created on their behalf by Glee Arvin. Manson Passey, OA an ophthalmic technician. The creation of this record is the provider's dictation and/or activities during the visit.    Electronically signed by: Glee Arvin. Manson Passey, [place] 12.16.2022 1:17 PM  Karie Chimera, M.D., Ph.D. Diseases & Surgery of the Retina and Vitreous [facility] 03/17/2021  I have reviewed the above documentation for accuracy and completeness, and I agree with the above. Karie Chimera, M.D., Ph.D. 03/17/21 1:17 PM  Abbreviations: M myopia (nearsighted); A astigmatism; H hyperopia (farsighted); P presbyopia; Mrx spectacle prescription;  CTL contact lenses; OD right eye; OS left eye; OU both eyes  XT exotropia; ET esotropia; PEK punctate epithelial keratitis; PEE punctate epithelial erosions; DES dry eye syndrome; MGD meibomian gland dysfunction; ATs artificial tears; PFAT's preservative free artificial tears; NSC nuclear sclerotic cataract; PSC posterior subcapsular cataract; ERM epi-retinal membrane; PVD posterior vitreous detachment; RD retinal detachment; DM diabetes mellitus; DR diabetic retinopathy; NPDR non-proliferative diabetic retinopathy; PDR proliferative diabetic retinopathy; CSME clinically significant macular edema; DME diabetic macular edema; dbh dot blot hemorrhages; CWS cotton wool spot; POAG primary open angle glaucoma; C/D cup-to-disc ratio; HVF humphrey visual field; GVF goldmann visual field; OCT optical coherence tomography; IOP intraocular pressure; BRVO Branch retinal vein occlusion; CRVO central retinal vein occlusion; CRAO central retinal artery occlusion; BRAO branch retinal artery occlusion; RT retinal tear; SB scleral buckle; PPV pars plana vitrectomy; VH Vitreous hemorrhage; PRP panretinal laser photocoagulation; IVK intravitreal kenalog; VMT vitreomacular traction; MH Macular hole;  NVD neovascularization  of the disc; NVE neovascularization elsewhere; AREDS age related eye disease study; ARMD age related macular degeneration; POAG primary open angle glaucoma; EBMD epithelial/anterior basement membrane dystrophy; ACIOL anterior chamber intraocular lens; IOL intraocular lens; PCIOL posterior chamber intraocular lens; Phaco/IOL phacoemulsification with intraocular lens placement; PRK photorefractive keratectomy; LASIK laser assisted in situ keratomileusis; HTN hypertension; DM diabetes mellitus; COPD chronic obstructive pulmonary disease

## 2021-03-17 ENCOUNTER — Ambulatory Visit (INDEPENDENT_AMBULATORY_CARE_PROVIDER_SITE_OTHER): Payer: Medicare Other | Admitting: Ophthalmology

## 2021-03-17 ENCOUNTER — Other Ambulatory Visit: Payer: Self-pay

## 2021-03-17 ENCOUNTER — Encounter (INDEPENDENT_AMBULATORY_CARE_PROVIDER_SITE_OTHER): Payer: Self-pay | Admitting: Ophthalmology

## 2021-03-17 DIAGNOSIS — H40053 Ocular hypertension, bilateral: Secondary | ICD-10-CM

## 2021-03-17 DIAGNOSIS — H3581 Retinal edema: Secondary | ICD-10-CM

## 2021-03-17 DIAGNOSIS — H40003 Preglaucoma, unspecified, bilateral: Secondary | ICD-10-CM

## 2021-03-17 DIAGNOSIS — H25813 Combined forms of age-related cataract, bilateral: Secondary | ICD-10-CM | POA: Diagnosis not present

## 2021-03-17 DIAGNOSIS — H401130 Primary open-angle glaucoma, bilateral, stage unspecified: Secondary | ICD-10-CM

## 2021-03-17 DIAGNOSIS — E119 Type 2 diabetes mellitus without complications: Secondary | ICD-10-CM

## 2021-03-20 DIAGNOSIS — E785 Hyperlipidemia, unspecified: Secondary | ICD-10-CM | POA: Diagnosis not present

## 2021-03-20 DIAGNOSIS — I10 Essential (primary) hypertension: Secondary | ICD-10-CM | POA: Diagnosis not present

## 2021-03-20 DIAGNOSIS — E1169 Type 2 diabetes mellitus with other specified complication: Secondary | ICD-10-CM | POA: Diagnosis not present

## 2021-07-18 ENCOUNTER — Other Ambulatory Visit: Payer: Self-pay | Admitting: Family Medicine

## 2021-07-18 DIAGNOSIS — R1901 Right upper quadrant abdominal swelling, mass and lump: Secondary | ICD-10-CM

## 2021-08-10 ENCOUNTER — Other Ambulatory Visit: Payer: Medicare Other

## 2021-08-10 ENCOUNTER — Ambulatory Visit
Admission: RE | Admit: 2021-08-10 | Discharge: 2021-08-10 | Disposition: A | Discharge status: Home / Self Care | Payer: Medicare Other | Source: Ambulatory Visit | Attending: Family Medicine | Admitting: Family Medicine

## 2021-08-10 DIAGNOSIS — R1901 Right upper quadrant abdominal swelling, mass and lump: Secondary | ICD-10-CM

## 2021-08-10 MED ORDER — IOPAMIDOL (ISOVUE-300) INJECTION 61%
100.0000 mL | Freq: Once | INTRAVENOUS | Status: AC | PRN
  Administered 2021-08-10: 100 mL via INTRAVENOUS

## 2022-03-08 ENCOUNTER — Other Ambulatory Visit: Payer: Self-pay

## 2022-03-08 ENCOUNTER — Emergency Department (HOSPITAL_COMMUNITY)
Admission: EM | Admit: 2022-03-08 | Discharge: 2022-03-08 | Discharge status: Against Medical Advice | Payer: Medicare Other | Attending: Medical | Admitting: Medical

## 2022-03-08 DIAGNOSIS — M549 Dorsalgia, unspecified: Secondary | ICD-10-CM | POA: Diagnosis not present

## 2022-03-08 DIAGNOSIS — Z5321 Procedure and treatment not carried out due to patient leaving prior to being seen by health care provider: Secondary | ICD-10-CM | POA: Insufficient documentation

## 2022-03-08 DIAGNOSIS — R0789 Other chest pain: Secondary | ICD-10-CM | POA: Insufficient documentation

## 2022-03-08 DIAGNOSIS — R0602 Shortness of breath: Secondary | ICD-10-CM | POA: Diagnosis not present

## 2022-03-08 MED ORDER — CYCLOBENZAPRINE HCL 10 MG PO TABS
5.0000 mg | ORAL_TABLET | Freq: Once | ORAL | Status: AC
  Administered 2022-03-08: 5 mg via ORAL
  Filled 2022-03-08: qty 1

## 2022-03-08 MED ORDER — LIDOCAINE 5 % EX PTCH
1.0000 | MEDICATED_PATCH | CUTANEOUS | Status: DC
  Administered 2022-03-08: 1 via TRANSDERMAL
  Filled 2022-03-08: qty 1

## 2022-03-08 MED ORDER — KETOROLAC TROMETHAMINE 60 MG/2ML IM SOLN
30.0000 mg | Freq: Once | INTRAMUSCULAR | Status: AC
  Administered 2022-03-08: 30 mg via INTRAMUSCULAR
  Filled 2022-03-08: qty 2

## 2022-03-08 NOTE — ED Notes (Signed)
Attempted to do EKG in room and EKG in room not working

## 2022-03-08 NOTE — ED Triage Notes (Signed)
Pt reports left sided chest pain shooting down left side of body all the way to toes with sob.  Denies fall, trauma, pushing/pulling/lifting heavy object.  Denies headache

## 2022-03-08 NOTE — ED Notes (Signed)
Wife comes to triage RN in lobby and states "why did you lie and say you was going to give meds and you haven't"  Explained to wife that I am triaging patients and when I finish that I will give meds or find an RN that can administer them.

## 2022-03-08 NOTE — ED Provider Triage Note (Signed)
Emergency Medicine Provider Triage Evaluation Note  Raymond Munoz. , a 71 y.o. male  was evaluated in triage.  Pt complains of L buttocks pain radiating down left leg for the last 4 days. Difficulty sleeping d/t pain. Has tried tylenol and bengay w/o relief.  Denies any chest pain, sob.  Review of Systems  Positive: Back pain Negative: Loss of urine or bowels  Physical Exam  BP (!) 158/84   Pulse 100   Temp 98.2 F (36.8 C)   Resp 20   SpO2 100%  Gen:   Awake, no distress   Resp:  Normal effort  MSK:   Moves extremities without difficulty  Other:  +SLR of L leg  Medical Decision Making  Medically screening exam initiated at 11:58 AM.  Appropriate orders placed.  [name] was informed that the remainder of the evaluation will be completed by another provider, this initial triage assessment does not replace that evaluation, and the importance of remaining in the ED until their evaluation is complete.     Pete Pelt, [place] 03/08/22 1201

## 2022-03-14 ENCOUNTER — Ambulatory Visit
Admission: RE | Admit: 2022-03-14 | Discharge: 2022-03-14 | Disposition: A | Discharge status: Home / Self Care | Payer: Medicare Other | Source: Ambulatory Visit | Attending: Family Medicine | Admitting: Family Medicine

## 2022-03-14 ENCOUNTER — Other Ambulatory Visit: Payer: Self-pay | Admitting: Family Medicine

## 2022-03-14 DIAGNOSIS — M25552 Pain in left hip: Secondary | ICD-10-CM

## 2022-03-16 ENCOUNTER — Encounter (INDEPENDENT_AMBULATORY_CARE_PROVIDER_SITE_OTHER): Payer: Self-pay

## 2022-03-16 ENCOUNTER — Encounter (INDEPENDENT_AMBULATORY_CARE_PROVIDER_SITE_OTHER): Payer: Medicare Other | Admitting: Ophthalmology

## 2022-04-19 ENCOUNTER — Other Ambulatory Visit (HOSPITAL_COMMUNITY): Payer: Self-pay

## 2023-04-22 ENCOUNTER — Inpatient Hospital Stay (HOSPITAL_COMMUNITY)
Admission: EM | Admit: 2023-04-22 | Discharge: 2023-04-24 | DRG: 812 | Disposition: A | Discharge status: Home / Self Care | Payer: Medicare Other | Attending: Internal Medicine | Admitting: Internal Medicine

## 2023-04-22 ENCOUNTER — Other Ambulatory Visit: Payer: Self-pay

## 2023-04-22 ENCOUNTER — Emergency Department (HOSPITAL_COMMUNITY): Payer: Medicare Other

## 2023-04-22 ENCOUNTER — Encounter (HOSPITAL_COMMUNITY): Payer: Self-pay

## 2023-04-22 DIAGNOSIS — R195 Other fecal abnormalities: Secondary | ICD-10-CM | POA: Diagnosis present

## 2023-04-22 DIAGNOSIS — Z833 Family history of diabetes mellitus: Secondary | ICD-10-CM | POA: Diagnosis not present

## 2023-04-22 DIAGNOSIS — Z7982 Long term (current) use of aspirin: Secondary | ICD-10-CM | POA: Diagnosis not present

## 2023-04-22 DIAGNOSIS — M1990 Unspecified osteoarthritis, unspecified site: Secondary | ICD-10-CM | POA: Diagnosis present

## 2023-04-22 DIAGNOSIS — R0602 Shortness of breath: Principal | ICD-10-CM

## 2023-04-22 DIAGNOSIS — Z794 Long term (current) use of insulin: Secondary | ICD-10-CM

## 2023-04-22 DIAGNOSIS — Z79899 Other long term (current) drug therapy: Secondary | ICD-10-CM

## 2023-04-22 DIAGNOSIS — E119 Type 2 diabetes mellitus without complications: Secondary | ICD-10-CM

## 2023-04-22 DIAGNOSIS — D509 Iron deficiency anemia, unspecified: Secondary | ICD-10-CM | POA: Diagnosis present

## 2023-04-22 DIAGNOSIS — Z1152 Encounter for screening for COVID-19: Secondary | ICD-10-CM

## 2023-04-22 DIAGNOSIS — Z8349 Family history of other endocrine, nutritional and metabolic diseases: Secondary | ICD-10-CM

## 2023-04-22 DIAGNOSIS — K219 Gastro-esophageal reflux disease without esophagitis: Secondary | ICD-10-CM | POA: Diagnosis present

## 2023-04-22 DIAGNOSIS — K3189 Other diseases of stomach and duodenum: Secondary | ICD-10-CM | POA: Diagnosis present

## 2023-04-22 DIAGNOSIS — J45909 Unspecified asthma, uncomplicated: Secondary | ICD-10-CM | POA: Diagnosis present

## 2023-04-22 DIAGNOSIS — D120 Benign neoplasm of cecum: Secondary | ICD-10-CM | POA: Diagnosis present

## 2023-04-22 DIAGNOSIS — K5140 Inflammatory polyps of colon without complications: Secondary | ICD-10-CM | POA: Diagnosis not present

## 2023-04-22 DIAGNOSIS — F419 Anxiety disorder, unspecified: Secondary | ICD-10-CM | POA: Diagnosis present

## 2023-04-22 DIAGNOSIS — Z7984 Long term (current) use of oral hypoglycemic drugs: Secondary | ICD-10-CM

## 2023-04-22 DIAGNOSIS — D649 Anemia, unspecified: Secondary | ICD-10-CM | POA: Diagnosis not present

## 2023-04-22 DIAGNOSIS — Z886 Allergy status to analgesic agent status: Secondary | ICD-10-CM

## 2023-04-22 DIAGNOSIS — Z66 Do not resuscitate: Secondary | ICD-10-CM | POA: Diagnosis present

## 2023-04-22 DIAGNOSIS — K648 Other hemorrhoids: Secondary | ICD-10-CM | POA: Diagnosis present

## 2023-04-22 DIAGNOSIS — G4700 Insomnia, unspecified: Secondary | ICD-10-CM | POA: Diagnosis present

## 2023-04-22 DIAGNOSIS — Z8249 Family history of ischemic heart disease and other diseases of the circulatory system: Secondary | ICD-10-CM | POA: Diagnosis not present

## 2023-04-22 DIAGNOSIS — I10 Essential (primary) hypertension: Secondary | ICD-10-CM | POA: Diagnosis present

## 2023-04-22 DIAGNOSIS — Z885 Allergy status to narcotic agent status: Secondary | ICD-10-CM | POA: Diagnosis not present

## 2023-04-22 DIAGNOSIS — E876 Hypokalemia: Secondary | ICD-10-CM | POA: Diagnosis present

## 2023-04-22 DIAGNOSIS — K2970 Gastritis, unspecified, without bleeding: Secondary | ICD-10-CM | POA: Diagnosis present

## 2023-04-22 LAB — BASIC METABOLIC PANEL
Anion gap: 8 (ref 5–15)
BUN: 17 mg/dL (ref 8–23)
CO2: 26 mmol/L (ref 22–32)
Calcium: 8.6 mg/dL — ABNORMAL LOW (ref 8.9–10.3)
Chloride: 104 mmol/L (ref 98–111)
Creatinine, Ser: 1.14 mg/dL (ref 0.61–1.24)
GFR, Estimated: >60 mL/min (ref >60)
Glucose, Bld: 164 mg/dL — ABNORMAL HIGH (ref 70–99)
Potassium: 3.8 mmol/L (ref 3.5–5.1)
Sodium: 138 mmol/L (ref 135–145)

## 2023-04-22 LAB — IRON AND TIBC
Iron: 15 ug/dL — ABNORMAL LOW (ref 45–182)
Saturation Ratios: 3 % — ABNORMAL LOW (ref 17.9–39.5)
TIBC: 591 ug/dL — ABNORMAL HIGH (ref 250–450)
UIBC: 576 ug/dL

## 2023-04-22 LAB — CBC WITH DIFFERENTIAL/PLATELET
Abs Immature Granulocytes: 0.04 10*3/uL (ref 0.00–0.07)
Basophils Absolute: 0 10*3/uL (ref 0.0–0.1)
Basophils Relative: 0 %
Eosinophils Absolute: 0.1 10*3/uL (ref 0.0–0.5)
Eosinophils Relative: 1 %
HCT: 21.5 % — ABNORMAL LOW (ref 39.0–52.0)
Hemoglobin: 5.7 g/dL — CL (ref 13.0–17.0)
Immature Granulocytes: 1 %
Lymphocytes Relative: 19 %
Lymphs Abs: 1.7 10*3/uL (ref 0.7–4.0)
MCH: 20 pg — ABNORMAL LOW (ref 26.0–34.0)
MCHC: 26.5 g/dL — ABNORMAL LOW (ref 30.0–36.0)
MCV: 75.4 fL — ABNORMAL LOW (ref 80.0–100.0)
Monocytes Absolute: 0.6 10*3/uL (ref 0.1–1.0)
Monocytes Relative: 7 %
Neutro Abs: 6.4 10*3/uL (ref 1.7–7.7)
Neutrophils Relative %: 72 %
Platelets: 241 10*3/uL (ref 150–400)
RBC: 2.85 MIL/uL — ABNORMAL LOW (ref 4.22–5.81)
RDW: 17.5 % — ABNORMAL HIGH (ref 11.5–15.5)
WBC: 8.8 10*3/uL (ref 4.0–10.5)
nRBC: 0.2 % (ref 0.0–0.2)

## 2023-04-22 LAB — HEPATIC FUNCTION PANEL
ALT: 24 U/L (ref 0–44)
AST: 27 U/L (ref 15–41)
Albumin: 3.9 g/dL (ref 3.5–5.0)
Alkaline Phosphatase: 112 U/L (ref 38–126)
Bilirubin, Direct: <0.1 mg/dL (ref 0.0–0.2)
Total Bilirubin: 1.7 mg/dL — ABNORMAL HIGH (ref 0.0–1.2)
Total Protein: 8.2 g/dL — ABNORMAL HIGH (ref 6.5–8.1)

## 2023-04-22 LAB — GLUCOSE, CAPILLARY
Glucose-Capillary: 142 mg/dL — ABNORMAL HIGH (ref 70–99)
Glucose-Capillary: 104 mg/dL — ABNORMAL HIGH (ref 70–99)

## 2023-04-22 LAB — RETICULOCYTES
Immature Retic Fract: 25.3 % — ABNORMAL HIGH (ref 2.3–15.9)
RBC.: 3.1 MIL/uL — ABNORMAL LOW (ref 4.22–5.81)
Retic Count, Absolute: 73 10*3/uL (ref 19.0–186.0)
Retic Ct Pct: 2.4 % (ref 0.4–3.1)

## 2023-04-22 LAB — FOLATE: Folate: 20.5 ng/mL (ref >5.9)

## 2023-04-22 LAB — HEMOGLOBIN AND HEMATOCRIT, BLOOD
HCT: 21.2 % — ABNORMAL LOW (ref 39.0–52.0)
Hemoglobin: 5.6 g/dL — CL (ref 13.0–17.0)

## 2023-04-22 LAB — VITAMIN B12: Vitamin B-12: 818 pg/mL (ref 180–914)

## 2023-04-22 LAB — HEMOGLOBIN A1C
Hgb A1c MFr Bld: 8.4 % — ABNORMAL HIGH (ref 4.8–5.6)
Mean Plasma Glucose: 194.38 mg/dL

## 2023-04-22 LAB — RESP PANEL BY RT-PCR (RSV, FLU A&B, COVID)  RVPGX2
Influenza A by PCR: NEGATIVE
Influenza B by PCR: NEGATIVE
Resp Syncytial Virus by PCR: NEGATIVE
SARS Coronavirus 2 by RT PCR: NEGATIVE

## 2023-04-22 LAB — ABO/RH: ABO/RH(D): O POS

## 2023-04-22 LAB — PREPARE RBC (CROSSMATCH)

## 2023-04-22 LAB — TROPONIN I (HIGH SENSITIVITY)
Troponin I (High Sensitivity): 11 ng/L (ref <18)
Troponin I (High Sensitivity): 12 ng/L (ref <18)

## 2023-04-22 LAB — POC OCCULT BLOOD, ED: Fecal Occult Bld: POSITIVE — AB

## 2023-04-22 LAB — BRAIN NATRIURETIC PEPTIDE: B Natriuretic Peptide: 109.4 pg/mL — ABNORMAL HIGH (ref 0.0–100.0)

## 2023-04-22 LAB — FERRITIN: Ferritin: 7 ng/mL — ABNORMAL LOW (ref 24–336)

## 2023-04-22 MED ORDER — GABAPENTIN 300 MG PO CAPS
600.0000 mg | ORAL_CAPSULE | Freq: Every day | ORAL | Status: DC
  Administered 2023-04-22 – 2023-04-23 (×2): 600 mg via ORAL
  Filled 2023-04-22 (×2): qty 2

## 2023-04-22 MED ORDER — SODIUM CHLORIDE 0.9% IV SOLUTION: Freq: Once | INTRAVENOUS | Status: AC

## 2023-04-22 MED ORDER — GABAPENTIN 300 MG PO CAPS: 300.0000 mg | ORAL_CAPSULE | Freq: Two times a day (BID) | ORAL | Status: DC

## 2023-04-22 MED ORDER — INSULIN ASPART 100 UNIT/ML IJ SOLN
0.0000 [IU] | Freq: Three times a day (TID) | INTRAMUSCULAR | Status: DC
  Administered 2023-04-23: 2 [IU] via SUBCUTANEOUS
  Filled 2023-04-22: qty 0.15

## 2023-04-22 MED ORDER — SODIUM CHLORIDE 0.9% FLUSH: 3.0000 mL | INTRAVENOUS | Status: DC | PRN

## 2023-04-22 MED ORDER — FUROSEMIDE 10 MG/ML IJ SOLN
40.0000 mg | Freq: Once | INTRAMUSCULAR | Status: AC
  Administered 2023-04-22: 40 mg via INTRAVENOUS
  Filled 2023-04-22: qty 4

## 2023-04-22 MED ORDER — ACETAMINOPHEN 650 MG RE SUPP: 650.0000 mg | Freq: Four times a day (QID) | RECTAL | Status: DC | PRN

## 2023-04-22 MED ORDER — INSULIN GLARGINE-YFGN 100 UNIT/ML ~~LOC~~ SOLN
50.0000 [IU] | Freq: Every day | SUBCUTANEOUS | Status: DC
  Filled 2023-04-22: qty 0.5

## 2023-04-22 MED ORDER — PANTOPRAZOLE SODIUM 40 MG IV SOLR
80.0000 mg | Freq: Once | INTRAVENOUS | Status: AC
  Administered 2023-04-22: 80 mg via INTRAVENOUS
  Filled 2023-04-22: qty 20

## 2023-04-22 MED ORDER — ACETAMINOPHEN 325 MG PO TABS: 650.0000 mg | ORAL_TABLET | Freq: Four times a day (QID) | ORAL | Status: DC | PRN

## 2023-04-22 MED ORDER — ALBUTEROL SULFATE (2.5 MG/3ML) 0.083% IN NEBU: 2.5000 mg | INHALATION_SOLUTION | Freq: Four times a day (QID) | RESPIRATORY_TRACT | Status: DC | PRN

## 2023-04-22 MED ORDER — ONDANSETRON HCL 4 MG PO TABS: 4.0000 mg | ORAL_TABLET | Freq: Four times a day (QID) | ORAL | Status: DC | PRN

## 2023-04-22 MED ORDER — GABAPENTIN 300 MG PO CAPS
300.0000 mg | ORAL_CAPSULE | Freq: Every day | ORAL | Status: DC
  Administered 2023-04-23 – 2023-04-24 (×2): 300 mg via ORAL
  Filled 2023-04-22 (×2): qty 1

## 2023-04-22 MED ORDER — SODIUM CHLORIDE 0.9 % IV SOLN
250.0000 mL | INTRAVENOUS | Status: AC | PRN
Start: 2023-04-22 — End: 2023-04-23
  Administered 2023-04-22: 250 mL via INTRAVENOUS

## 2023-04-22 MED ORDER — ALBUTEROL SULFATE HFA 108 (90 BASE) MCG/ACT IN AERS: 2.0000 | INHALATION_SPRAY | RESPIRATORY_TRACT | Status: DC | PRN

## 2023-04-22 MED ORDER — GEMFIBROZIL 600 MG PO TABS
600.0000 mg | ORAL_TABLET | Freq: Two times a day (BID) | ORAL | Status: DC
  Administered 2023-04-22 – 2023-04-23 (×3): 600 mg via ORAL
  Filled 2023-04-22 (×4): qty 1

## 2023-04-22 MED ORDER — PANTOPRAZOLE SODIUM 40 MG PO TBEC
40.0000 mg | DELAYED_RELEASE_TABLET | Freq: Once | ORAL | Status: AC
  Administered 2023-04-22: 40 mg via ORAL
  Filled 2023-04-22: qty 1

## 2023-04-22 MED ORDER — SODIUM CHLORIDE 0.9% FLUSH
3.0000 mL | Freq: Two times a day (BID) | INTRAVENOUS | Status: DC
  Administered 2023-04-22 – 2023-04-24 (×4): 3 mL via INTRAVENOUS

## 2023-04-22 MED ORDER — INSULIN GLARGINE-YFGN 100 UNIT/ML ~~LOC~~ SOLN
40.0000 [IU] | Freq: Every day | SUBCUTANEOUS | Status: DC
  Administered 2023-04-22: 40 [IU] via SUBCUTANEOUS
  Filled 2023-04-22 (×3): qty 0.4

## 2023-04-22 MED ORDER — ONDANSETRON HCL 4 MG/2ML IJ SOLN: 4.0000 mg | Freq: Four times a day (QID) | INTRAMUSCULAR | Status: DC | PRN

## 2023-04-22 NOTE — ED Provider Notes (Signed)
[place] EMERGENCY DEPARTMENT AT [facility] Provider Note   CSN: [id number] Arrival date & time: 04/22/23  1054     History  Chief Complaint  Patient presents with   Shortness of Breath   Raymond Munoz. is a 73 y.o. male who presents with shortness of breath and a dry cough for the past three weeks. He says he has been unable to lay back flat which has prevented him from sleeping well.  He also feels like his legs have been generally weak and he has had poor balance over the same timeframe.  He denies any history of lung disease, heart failure.  He does not use any oxygen or inhalers at home.  He notes a runny nose but denies any recent illnesses, fevers, sore throat. He denies any chest pain/pressure, abdominal, GI/GU symptoms, melena or hematochezia.     Home Medications Prior to Admission medications   Medication Sig Start Date End Date Taking? Authorizing Provider  acetaminophen (TYLENOL 8 HOUR) 650 MG CR tablet Take 1 tablet (650 mg total) by mouth every 8 (eight) hours as needed for pain. 05/17/14   Toy Cookey, MD  amLODipine-benazepril (LOTREL) 5-40 MG capsule Take 1 capsule by mouth daily. 01/16/21   [provider]  aspirin 81 MG chewable tablet Chew 81 mg by mouth daily.    [provider]  diphenhydrAMINE (SOMINEX) 25 MG tablet Take 25 mg by mouth at bedtime.     [provider]  fexofenadine (ALLEGRA) 180 MG tablet Take 180 mg by mouth daily.    [provider]  gabapentin (NEURONTIN) 300 MG capsule Take 300-600 mg by mouth 2 (two) times daily. 300mg  in the morning and 600mg  at bedtime    [provider]  gemfibrozil (LOPID) 600 MG tablet Take 600 mg by mouth 2 (two) times daily before a meal.  01/23/14   [provider]  glucose blood (FREESTYLE LITE) test strip Use as instructed 10/16/14   Danelle Berry, PA-C  hydrochlorothiazide (HYDRODIURIL) 25 MG tablet Take 25 mg by mouth daily.  02/26/14    [provider]  HYDROcodone-acetaminophen (NORCO/VICODIN) 5-325 MG per tablet Take 1-2 tablets by mouth every 6 (six) hours as needed for moderate pain or severe pain. Patient not taking: Reported on 03/17/2021 03/07/14   Ladona Mow, PA-C  Insulin Glargine (LANTUS SOLOSTAR) 100 UNIT/ML Solostar Pen Inject 70 Units into the skin at bedtime. 10/16/14   Danelle Berry, PA-C  Lancets (FREESTYLE) lancets Use as instructed 10/16/14   Danelle Berry, PA-C  metFORMIN (GLUCOPHAGE) 1000 MG tablet Take 1 tablet (1,000 mg total) by mouth daily with breakfast. 10/16/14   Danelle Berry, PA-C  Omega 3 1200 MG CAPS Take 2,400 mg by mouth 2 (two) times daily.    [provider]  omeprazole (PRILOSEC OTC) 20 MG tablet Take 20 mg by mouth daily.    [provider]  vitamin C (ASCORBIC ACID) 500 MG tablet Take 500 mg by mouth daily.    [provider]      Allergies    Aspirin, Codeine, and Ibuprofen    Review of Systems   Review of Systems  Constitutional: Negative.   HENT:  Positive for congestion and rhinorrhea.   Respiratory:  Positive for shortness of breath.   Cardiovascular: Negative.   Gastrointestinal: Negative.   Endocrine: Negative.   Genitourinary: Negative.   Musculoskeletal: Negative.   Skin:  Positive for pallor.  Allergic/Immunologic: Negative.   Neurological:  Positive for  weakness.  Hematological: Negative.   Psychiatric/Behavioral: Negative.      Physical Exam Updated Vital Signs BP (!) 175/93   Pulse 93   Temp 97.8 F (36.6 C)   Resp 20   Ht 5\' 7"  (1.702 m)   Wt 87 kg   SpO2 100%   BMI 30.04 kg/m  Physical Exam Constitutional:      Appearance: He is well-developed.  Cardiovascular:     Rate and Rhythm: Regular rhythm. Tachycardia present.     Heart sounds: Normal heart sounds.  Pulmonary:     Effort: Pulmonary effort is normal. Tachypnea present. No accessory muscle usage.     Breath sounds: Examination of the right-lower field reveals  rales. Examination of the left-lower field reveals rales. Decreased breath sounds and rales present. No wheezing or rhonchi.  Abdominal:     General: Bowel sounds are normal.     Palpations: Abdomen is soft.     Tenderness: There is no abdominal tenderness.  Neurological:     General: No focal deficit present.     Mental Status: He is alert and oriented to person, place, and time.    ED Results / Procedures / Treatments   Labs (all labs ordered are listed, but only abnormal results are displayed) Labs Reviewed  CBC WITH DIFFERENTIAL/PLATELET - Abnormal; Notable for the following components:      Result Value   RBC 2.85 (*)    Hemoglobin 5.7 (*)    HCT 21.5 (*)    MCV 75.4 (*)    MCH 20.0 (*)    MCHC 26.5 (*)    RDW 17.5 (*)    All other components within normal limits  BASIC METABOLIC PANEL - Abnormal; Notable for the following components:   Glucose, Bld 164 (*)    Calcium 8.6 (*)    All other components within normal limits  BRAIN NATRIURETIC PEPTIDE - Abnormal; Notable for the following components:   B Natriuretic Peptide 109.4 (*)    All other components within normal limits  HEPATIC FUNCTION PANEL - Abnormal; Notable for the following components:   Total Protein 8.2 (*)    Total Bilirubin 1.7 (*)    All other components within normal limits  RESP PANEL BY RT-PCR (RSV, FLU A&B, COVID)  [id number]  HEMOGLOBIN AND HEMATOCRIT, BLOOD  POC OCCULT BLOOD, ED  TROPONIN I (HIGH SENSITIVITY)  TROPONIN I (HIGH SENSITIVITY)   EKG EKG Interpretation Date/Time:  Monday April 22 2023 11:01:53 EST Ventricular Rate:  98 PR Interval:  189 QRS Duration:  88 QT Interval:  368 QTC Calculation: 470 R Axis:   63  Text Interpretation: Sinus rhythm Low voltage, precordial leads No significant change since last tracing Confirmed by Gwyneth Sprout ([id number]) on 04/22/2023 11:14:57 AM  Radiology DG Chest 2 View Result Date: 04/22/2023 CLINICAL DATA:  Shortness of breath and cough for 3  weeks EXAM: CHEST - 2 VIEW COMPARISON:  X-ray 12/08/2020 FINDINGS: Under penetrated radiograph. Enlarged cardiopericardial silhouette. Left-sided small pleural effusion identified with some adjacent opacity. Slight prominence of the central vasculature. No pneumothorax. Underinflated x-ray. Degenerative changes of the spine. Overlapping cardiac leads. Limited lateral view. IMPRESSION: Underinflation with enlarged heart and prominence of the central vasculature. Left-sided small pleural effusion and adjacent opacity. Electronically Signed   By: Karen Kays M.D.   On: 04/22/2023 14:21    Medications Ordered in ED Medications  albuterol (VENTOLIN HFA) 108 (90 Base) MCG/ACT inhaler 2 puff (has no administration in time range)  furosemide (LASIX)  injection 40 mg (has no administration in time range)   ED Course/ Medical Decision Making/ A&P   Medical Decision Making Amount and/or Complexity of Data Reviewed Labs: ordered. Radiology: ordered.  Risk Prescription drug management.  Raymond Munoz is a 73 year old male who presents with new onset dyspnea, dry cough, and orthopnea for the past three weeks.  Differential includes pneumonia, congestive heart failure, pulmonary embolism, ACS, anemia. On exam, he is hemodynamically stable and satting well on room air while sitting up. He has decreased breath sounds bilaterally and mildly increased work of breathing, so we started him on 2L [place]. He also has new orthopnea and 1+ pitting edema in his bilateral LE, concerning for heart failure. CBC shows severe microcytic anemia of 5.7, but he denies any bleeding. We do not have any recent labs for comparison, but since he has not notice any bleeding I suspect he may be having occult . He denies any recent bleeding and is not on a blood thinner. He says he recently had lab work a few months ago which was normal, so we repeated an H/H to confirm this low result. Hemoccult ordered.   Chest x-ray personally reviewed,  which shows a small left-sided effusion and increased opacity on the left. We will go ahead and diurese him with 40 mg IV lasix.   BMP unremarkable, normal electrolyes and kidney function. LFTs normal, bili slightly elevated at 1.7.  EKG personally reviewed, shows sinus rhythm, borderline tachycardia. Troponin 11.   Signed out this patient to the oncoming team to continue his care.    Annett Fabian, MD 04/22/23 1513    Gwyneth Sprout, MD 04/22/23 1524

## 2023-04-22 NOTE — ED Notes (Signed)
Added 2lpm [place] per MD

## 2023-04-22 NOTE — ED Provider Notes (Signed)
[facility] Hospital[facility] service aware of case.     Wynetta Fines, MD 04/22/23 [phone]

## 2023-04-22 NOTE — H&P (Signed)
History and Physical    Jadin Novello.  WUJ:[id number]  DOB: 1950-05-01  DOA: 04/22/2023 PCP: [name], MD   Patient coming from: Home  Chief Complaint: Shortness of breath  HPI: Bryheem Ligocki. is a 73 y.o. male with medical history of diabetes mellitus.  Hypertension and iron deficiency anemia who presents from home with 3 to 4 weeks of shortness of breath.  Patient states that initially the shortness of breath started with exertion and he had trouble walking to his mailbox.  Is gotten worse and even moving around in the bed makes him short of breath.  He was found to be anemic.  He does not recall any blood in the stool other than once when wiping.  No complaints of having an ulcer in the past.  No issues with heartburn or poor appetite.  No issues with weight loss or change in bowel movements. He also admits to having anxiety because of the shortness of breath and has had insomnia related to. He admits to 3 pillow orthopnea and mild pedal edema which apparently also recently.  No history of heart issues in the past, no chest pain at rest or with exertion and no palpitations.   ED Course: Hemoglobin 5.7-repeated and found to be 5.6 Given Lasix in the ED because he was found to be in heart failure Review of Systems:  Has problems with easy bruising All other systems reviewed and apart from HPI, are negative.  Past Medical History:  Diagnosis Date   Anxiety    Arthritis    Diabetes mellitus without complication (HCC)    Type 2   GERD (gastroesophageal reflux disease)    Hypertension     Past Surgical History:  Procedure Laterality Date   KNEE SURGERY Left    RADIOLOGY WITH ANESTHESIA N/A 08/26/2014   Procedure: MRI LUMBAR SPINE   (RADIOLOGY WITH ANESTHESIA);  Surgeon: Medication Radiologist, MD;  Location: [facility] OR;  Service: Radiology;  Laterality: N/A;    Social History:   reports that he has never smoked. He has never used smokeless tobacco. He reports that he  does not drink alcohol and does not use drugs.  Allergies  Allergen Reactions   Codeine     unknown   Ibuprofen     REACTION: unspecified   Aspirin Nausea Only    Irritates stomach-can take 81 mg    Family History  Problem Relation Age of Onset   Hypertension Mother    Diabetes type II Mother    Thyroid disease Mother      Prior to Admission medications   Medication Sig Start Date End Date Taking? Authorizing Provider  acetaminophen (TYLENOL 8 HOUR) 650 MG CR tablet Take 1 tablet (650 mg total) by mouth every 8 (eight) hours as needed for pain. 05/17/14  Yes Toy Cookey, MD  aspirin 81 MG chewable tablet Chew 81 mg by mouth daily.   Yes [provider]  fexofenadine (ALLEGRA) 180 MG tablet Take 180 mg by mouth daily.   Yes [provider]  gabapentin (NEURONTIN) 300 MG capsule Take 300-600 mg by mouth 2 (two) times daily. 300mg  in the morning and 600mg  at bedtime   Yes [provider]  gemfibrozil (LOPID) 600 MG tablet Take 600 mg by mouth 2 (two) times daily before a meal.  01/23/14  Yes [provider]  hydrochlorothiazide (HYDRODIURIL) 25 MG tablet Take 25 mg by mouth daily.  02/26/14  Yes [provider]  Insulin Glargine (LANTUS  SOLOSTAR) 100 UNIT/ML Solostar Pen Inject 70 Units into the skin at bedtime. 10/16/14  Yes Danelle Berry, PA-C  metFORMIN (GLUCOPHAGE) 1000 MG tablet Take 1 tablet (1,000 mg total) by mouth daily with breakfast. 10/16/14  Yes Tapia, Leisa, PA-C  Omega 3 1200 MG CAPS Take 2,400 mg by mouth 2 (two) times daily.   Yes [provider]  omeprazole (PRILOSEC OTC) 20 MG tablet Take 20 mg by mouth daily.   Yes [provider]  vitamin C (ASCORBIC ACID) 500 MG tablet Take 500 mg by mouth daily.   Yes [provider]  glucose blood (FREESTYLE LITE) test strip Use as instructed 10/16/14   Danelle Berry, PA-C  Lancets (FREESTYLE) lancets Use as instructed 10/16/14   Danelle Berry, PA-C     Physical Exam: Wt Readings from Last 3 Encounters:  04/22/23 87 kg  12/08/20 87 kg  08/26/14 87.5 kg   Vitals:   04/22/23 1200 04/22/23 1230 04/22/23 1300 04/22/23 1554  BP: (!) 156/83 (!) 185/97 (!) 175/93 (!) 166/87  Pulse: 94 91 93 86  Resp: 19 16 20 19   Temp:   98 F (36.7 C)   SpO2: 97% 100% 100% 100%  Weight:      Height:          Constitutional:  Calm & comfortable Eyes: PERRLA, lids and conjunctivae normal ENT:  Mucous membranes are moist.  Pharynx clear of exudate   Normal dentition.  Respiratory:  Clear to auscultation bilaterally  Normal respiratory effort.  Cardiovascular:  S1 & S2 heard, regular rate and rhythm No Murmurs Abdomen:  Non distended No tenderness, No masses Bowel sounds normal Extremities:  No clubbing / cyanosis 1+ pitting edema bilaterally Skin:  No rashes, lesions or ulcers Neurologic:  AAO x 3 CN 2-12 grossly intact Sensation intact Strength 5/5 in all 4 extremities Psychiatric:  Normal Mood and affect    Labs on Admission: I have personally reviewed following labs and imaging studies  CBC: Recent Labs  Lab 04/22/23 1205 04/22/23 1315  WBC 8.8  --   NEUTROABS 6.4  --   HGB 5.7* 5.6*  HCT 21.5* 21.2*  MCV 75.4*  --   PLT 241  --    Basic Metabolic Panel: Recent Labs  Lab 04/22/23 1205  NA 138  K 3.8  CL 104  CO2 26  GLUCOSE 164*  BUN 17  CREATININE 1.14  CALCIUM 8.6*   GFR: Estimated Creatinine Clearance: 61.7 mL/min (by C-G formula based on SCr of 1.14 mg/dL). Liver Function Tests: Recent Labs  Lab 04/22/23 1205  AST 27  ALT 24  ALKPHOS 112  BILITOT 1.7*  PROT 8.2*  ALBUMIN 3.9   Normal sinus rhythm Recent Results (from the past 240 hours)  Resp panel by RT-PCR (RSV, Flu A&B, Covid) Anterior Nasal Swab     Status: None   Collection Time: 04/22/23 11:06 AM   Specimen: Anterior Nasal Swab  Result Value Ref Range Status   SARS Coronavirus 2 by RT PCR NEGATIVE NEGATIVE Final    Comment:  (NOTE) SARS-CoV-2 target nucleic acids are NOT DETECTED.  The SARS-CoV-2 RNA is generally detectable in upper respiratory specimens during the acute phase of infection. The lowest concentration of SARS-CoV-2 viral copies this assay can detect is 138 copies/mL. A negative result does not preclude SARS-Cov-2 infection and should not be used as the sole basis for treatment or other patient management decisions. A negative result may occur with  improper specimen collection/handling, submission of specimen  other than nasopharyngeal swab, presence of viral mutation(s) within the areas targeted by this assay, and inadequate number of viral copies(<138 copies/mL). A negative result must be combined with clinical observations, patient history, and epidemiological information. The expected result is Negative.  Fact Sheet for Patients:  BloggerCourse.com  Fact Sheet for Healthcare Providers:  SeriousBroker.it  This test is no t yet approved or cleared by the [place] FDA and  has been authorized for detection and/or diagnosis of SARS-CoV-2 by FDA under an Emergency Use Authorization (EUA). This EUA will remain  in effect (meaning this test can be used) for the duration of the COVID-19 declaration under Section 564(b)(1) of the Act, 21 U.S.C.section 360bbb-3(b)(1), unless the authorization is terminated  or revoked sooner.       Influenza A by PCR NEGATIVE NEGATIVE Final   Influenza B by PCR NEGATIVE NEGATIVE Final    Comment: (NOTE) The Xpert Xpress SARS-CoV-2/FLU/RSV plus assay is intended as an aid in the diagnosis of influenza from Nasopharyngeal swab specimens and should not be used as a sole basis for treatment. Nasal washings and aspirates are unacceptable for Xpert Xpress SARS-CoV-2/FLU/RSV testing.  Fact Sheet for Patients: BloggerCourse.com  Fact Sheet for Healthcare  Providers: SeriousBroker.it  This test is not yet approved or cleared by the [place] FDA and has been authorized for detection and/or diagnosis of SARS-CoV-2 by FDA under an Emergency Use Authorization (EUA). This EUA will remain in effect (meaning this test can be used) for the duration of the COVID-19 declaration under Section 564(b)(1) of the Act, 21 U.S.C. section 360bbb-3(b)(1), unless the authorization is terminated or revoked.     Resp Syncytial Virus by PCR NEGATIVE NEGATIVE Final    Comment: (NOTE) Fact Sheet for Patients: BloggerCourse.com  Fact Sheet for Healthcare Providers: SeriousBroker.it  This test is not yet approved or cleared by the [place] FDA and has been authorized for detection and/or diagnosis of SARS-CoV-2 by FDA under an Emergency Use Authorization (EUA). This EUA will remain in effect (meaning this test can be used) for the duration of the COVID-19 declaration under Section 564(b)(1) of the Act, 21 U.S.C. section 360bbb-3(b)(1), unless the authorization is terminated or revoked.  Performed at [facility], [address] [place], [place] [address]      Radiological Exams on Admission: DG Chest 2 View Result Date: 04/22/2023 CLINICAL DATA:  Shortness of breath and cough for 3 weeks EXAM: CHEST - 2 VIEW COMPARISON:  X-ray 12/08/2020 FINDINGS: Under penetrated radiograph. Enlarged cardiopericardial silhouette. Left-sided small pleural effusion identified with some adjacent opacity. Slight prominence of the central vasculature. No pneumothorax. Underinflated x-ray. Degenerative changes of the spine. Overlapping cardiac leads. Limited lateral view. IMPRESSION: Underinflation with enlarged heart and prominence of the central vasculature. Left-sided small pleural effusion and adjacent opacity. Electronically Signed   By: Karen Kays M.D.   On: 04/22/2023  14:21    EKG: Independently reviewed.  Normal sinus rhythm  Assessment/Plan Principal Problem:   Symptomatic anemia with shortness of breath with minimal exertion -Hemoglobin 5.7-last hemoglobin was in 2022 and was 10.7 - Apparently has a history of iron deficiency -check anemia panel -Have ordered 2 units of packed red blood cells -Lungs are clear-pulse ox 99% on room air-hold off on further Lasix-he received 40 mg in the ED  Active Problems: Hemoccult positive -[facility] consulted by ED -Will place on clear liquids for now and allow GI to decide if diet should be advanced -Hold aspirin for now-no history  of CVA or MI    DM (diabetes mellitus), type 2 -He takes Lantus-70 units nightly-will start him on 40 while in the hospital - Will start sliding scale insulin - Hold metformin    Essential hypertension -Hold hydrochlorothiazide  Anxiety and insomnia -Due to current illness-I will hold off on placing him on medications - he should hopefully improve with blood transfusion  Pedal edema -Follow    DVT prophylaxis: SCDs Code Status: DO NOT RESUSCITATE Consults called: GI consulted by ED Admission status:  Level of care: Telemetry  Calvert Cantor MD [facility]    04/22/2023, 4:38 PM

## 2023-04-22 NOTE — ED Notes (Signed)
Attempted to call report to floor

## 2023-04-22 NOTE — ED Triage Notes (Signed)
Pt reports with shob and cough x 3 weeks. Pt has also noticed weakness in his legs and phlegm that he is unable to cough up.

## 2023-04-23 DIAGNOSIS — D649 Anemia, unspecified: Secondary | ICD-10-CM | POA: Diagnosis not present

## 2023-04-23 LAB — GLUCOSE, CAPILLARY
Glucose-Capillary: 75 mg/dL (ref 70–99)
Glucose-Capillary: 149 mg/dL — ABNORMAL HIGH (ref 70–99)
Glucose-Capillary: 116 mg/dL — ABNORMAL HIGH (ref 70–99)
Glucose-Capillary: 110 mg/dL — ABNORMAL HIGH (ref 70–99)

## 2023-04-23 LAB — CBC
HCT: 27 % — ABNORMAL LOW (ref 39.0–52.0)
Hemoglobin: 8 g/dL — ABNORMAL LOW (ref 13.0–17.0)
MCH: 22.4 pg — ABNORMAL LOW (ref 26.0–34.0)
MCHC: 29.6 g/dL — ABNORMAL LOW (ref 30.0–36.0)
MCV: 75.6 fL — ABNORMAL LOW (ref 80.0–100.0)
Platelets: 265 10*3/uL (ref 150–400)
RBC: 3.57 MIL/uL — ABNORMAL LOW (ref 4.22–5.81)
RDW: 17.7 % — ABNORMAL HIGH (ref 11.5–15.5)
WBC: 10.8 10*3/uL — ABNORMAL HIGH (ref 4.0–10.5)
nRBC: 0.2 % (ref 0.0–0.2)

## 2023-04-23 LAB — COMPREHENSIVE METABOLIC PANEL
ALT: 15 U/L (ref 0–44)
AST: 22 U/L (ref 15–41)
Albumin: 3.2 g/dL — ABNORMAL LOW (ref 3.5–5.0)
Alkaline Phosphatase: 54 U/L (ref 38–126)
Anion gap: 7 (ref 5–15)
BUN: 13 mg/dL (ref 8–23)
CO2: 26 mmol/L (ref 22–32)
Calcium: 8.5 mg/dL — ABNORMAL LOW (ref 8.9–10.3)
Chloride: 104 mmol/L (ref 98–111)
Creatinine, Ser: 0.98 mg/dL (ref 0.61–1.24)
GFR, Estimated: >60 mL/min (ref >60)
Glucose, Bld: 67 mg/dL — ABNORMAL LOW (ref 70–99)
Potassium: 3.4 mmol/L — ABNORMAL LOW (ref 3.5–5.1)
Sodium: 137 mmol/L (ref 135–145)
Total Bilirubin: 1 mg/dL (ref 0.0–1.2)
Total Protein: 7.1 g/dL (ref 6.5–8.1)

## 2023-04-23 MED ORDER — POTASSIUM CHLORIDE CRYS ER 20 MEQ PO TBCR
40.0000 meq | EXTENDED_RELEASE_TABLET | ORAL | Status: AC
  Administered 2023-04-23 (×2): 40 meq via ORAL
  Filled 2023-04-23 (×2): qty 2

## 2023-04-23 MED ORDER — INSULIN ASPART 100 UNIT/ML IJ SOLN: 0.0000 [IU] | INTRAMUSCULAR | Status: DC

## 2023-04-23 MED ORDER — PEG 3350-KCL-NA BICARB-NACL 420 G PO SOLR
4000.0000 mL | Freq: Once | ORAL | Status: AC
  Administered 2023-04-23: 4000 mL via ORAL

## 2023-04-23 MED ORDER — INSULIN ASPART 100 UNIT/ML IJ SOLN
0.0000 [IU] | INTRAMUSCULAR | Status: DC
Start: 2023-04-24 — End: 2023-04-24
  Administered 2023-04-24: 2 [IU] via SUBCUTANEOUS

## 2023-04-23 MED ORDER — SODIUM CHLORIDE 0.9 % IV SOLN
250.0000 mg | Freq: Once | INTRAVENOUS | Status: AC
Start: 2023-04-23 — End: 2023-04-23
  Administered 2023-04-23: 250 mg via INTRAVENOUS
  Filled 2023-04-23: qty 20

## 2023-04-23 NOTE — Inpatient Diabetes Management (Signed)
Inpatient Diabetes Program Recommendations  AACE/ADA: New Consensus Statement on Inpatient Glycemic Control   Target Ranges:  Prepandial:   less than 140 mg/dL      Peak postprandial:   less than 180 mg/dL (1-2 hours)      Critically ill patients:  140 - 180 mg/dL    Latest Reference Range & Units 04/22/23 18:44 04/22/23 21:17 04/23/23 07:51 04/23/23 11:39  Glucose-Capillary 70 - 99 mg/dL 160 (H) 109 (H) 75 323 (H)    Latest Reference Range & Units 04/23/23 05:37  Glucose 70 - 99 mg/dL 67 (L)   Review of Glycemic Control  Diabetes history: DM2 Outpatient Diabetes medications: Lantus 70 units at bedtime, Metformin 1000 mg QAM Current orders for Inpatient glycemic control: Semglee 40 units at bedtime, Novolog 0-15 units TID with meals  Inpatient Diabetes Program Recommendations:    Insulin: Lab glucose 67 mg/dl today and CBG 75 mg/dl at 5:57 am today. Please consider decreasing Semglee to 30 units at bedtime.  Thanks, Orlando Penner, RN, MSN, CDCES Diabetes Coordinator Inpatient Diabetes Program [phone] (Team Pager from 8am to 5pm)

## 2023-04-23 NOTE — Plan of Care (Signed)
  Problem: Coping: Goal: Level of anxiety will decrease Outcome: Progressing   Problem: Pain Managment: Goal: General experience of comfort will improve and/or be controlled Outcome: Progressing   Problem: Safety: Goal: Ability to remain free from injury will improve Outcome: Progressing   Problem: Skin Integrity: Goal: Risk for impaired skin integrity will decrease Outcome: Progressing

## 2023-04-23 NOTE — Progress Notes (Signed)
   04/23/23 1517  TOC Brief Assessment  Insurance and Status Reviewed  Patient has primary care physician Yes  Home environment has been reviewed Single family home  Prior level of function: independednt  Prior/Current Home Services No current home services  Social Drivers of Health Review SDOH reviewed no interventions necessary  Readmission risk has been reviewed Yes  Transition of care needs no transition of care needs at this time

## 2023-04-23 NOTE — Consult Note (Signed)
Referring Provider: TH Primary Care Physician:  [name], MD Primary Gastroenterologist: Gentry Fitz  Reason for Consultation: Iron deficiency anemia, heme positive stool  HPI: Raymond Munoz. is a 73 y.o. male with past medical history of GERD, diabetes and hypertension presented to the hospital with shortness of breath.  Upon initial evaluation, he was found to have anemia with hemoglobin of 5.7.  Occult blood positive.  Normal LFTs.  Iron studies shows iron deficiency anemia.  Normal vitamin B12 and folate level.  His hemoglobin was 10.7 in 2022.  Patient seen and examined at bedside.  He denies seeing any blood in the stool or black stool.  Denies any abdominal pain, nausea or vomiting.  Denies reflux, trouble swallowing or pain while swallowing.  Last colonoscopy in 2009.  No family history of colon cancer.  Denies NSAID use.  Past Medical History:  Diagnosis Date   Anxiety    Arthritis    Diabetes mellitus without complication (HCC)    Type 2   GERD (gastroesophageal reflux disease)    Hypertension     Past Surgical History:  Procedure Laterality Date   KNEE SURGERY Left    RADIOLOGY WITH ANESTHESIA N/A 08/26/2014   Procedure: MRI LUMBAR SPINE   (RADIOLOGY WITH ANESTHESIA);  Surgeon: Medication Radiologist, MD;  Location: [facility] OR;  Service: Radiology;  Laterality: N/A;    Prior to Admission medications   Medication Sig Start Date End Date Taking? Authorizing Provider  acetaminophen (TYLENOL 8 HOUR) 650 MG CR tablet Take 1 tablet (650 mg total) by mouth every 8 (eight) hours as needed for pain. 05/17/14  Yes Toy Cookey, MD  aspirin 81 MG chewable tablet Chew 81 mg by mouth daily.   Yes [provider]  fexofenadine (ALLEGRA) 180 MG tablet Take 180 mg by mouth daily.   Yes [provider]  gabapentin (NEURONTIN) 300 MG capsule Take 300-600 mg by mouth 2 (two) times daily. 300mg  in the morning and 600mg  at bedtime   Yes [provider]   gemfibrozil (LOPID) 600 MG tablet Take 600 mg by mouth 2 (two) times daily before a meal.  01/23/14  Yes [provider]  hydrochlorothiazide (HYDRODIURIL) 25 MG tablet Take 25 mg by mouth daily.  02/26/14  Yes [provider]  Insulin Glargine (LANTUS SOLOSTAR) 100 UNIT/ML Solostar Pen Inject 70 Units into the skin at bedtime. 10/16/14  Yes Danelle Berry, PA-C  metFORMIN (GLUCOPHAGE) 1000 MG tablet Take 1 tablet (1,000 mg total) by mouth daily with breakfast. 10/16/14  Yes Tapia, Leisa, PA-C  Omega 3 1200 MG CAPS Take 2,400 mg by mouth 2 (two) times daily.   Yes [provider]  omeprazole (PRILOSEC OTC) 20 MG tablet Take 20 mg by mouth daily.   Yes [provider]  vitamin C (ASCORBIC ACID) 500 MG tablet Take 500 mg by mouth daily.   Yes [provider]  glucose blood (FREESTYLE LITE) test strip Use as instructed 10/16/14   Danelle Berry, PA-C  Lancets (FREESTYLE) lancets Use as instructed 10/16/14   Danelle Berry, PA-C    Scheduled Meds:  gabapentin  300 mg Oral Daily   And   gabapentin  600 mg Oral QHS   gemfibrozil  600 mg Oral BID AC   insulin aspart  0-15 Units Subcutaneous TID WC   insulin glargine-yfgn  40 Units Subcutaneous QHS   sodium chloride flush  3 mL Intravenous Q12H   Continuous Infusions:  sodium chloride 250 mL (04/22/23 1824)  ferric gluconate (FERRLECIT) IVPB 250 mg (04/23/23 0928)   PRN Meds:.sodium chloride, acetaminophen **OR** acetaminophen, albuterol, ondansetron **OR** ondansetron (ZOFRAN) IV, sodium chloride flush  Allergies as of 04/22/2023 - Review Complete 04/22/2023  Allergen Reaction Noted   Codeine  05/05/2007   Ibuprofen  06/14/2006   Aspirin Nausea Only 05/05/2007    Family History  Problem Relation Age of Onset   Hypertension Mother    Diabetes type II Mother    Thyroid disease Mother     Social History   Socioeconomic History   Marital status: Married    Spouse name: Not on file   Number of  children: Not on file   Years of education: Not on file   Highest education level: Not on file  Occupational History   Not on file  Tobacco Use   Smoking status: Never   Smokeless tobacco: Never  Vaping Use   Vaping status: Never Used  Substance and Sexual Activity   Alcohol use: No   Drug use: No   Sexual activity: Not on file  Other Topics Concern   Not on file  Social History Narrative   Not on file   Social Drivers of Health   Financial Resource Strain: Not on file  Food Insecurity: No Food Insecurity (04/23/2023)   Hunger Vital Sign    Worried About Running Out of Food in the Last Year: Never true    Ran Out of Food in the Last Year: Never true  Transportation Needs: No Transportation Needs (04/23/2023)   PRAPARE - Administrator, Civil Service (Medical): No    Lack of Transportation (Non-Medical): No  Physical Activity: Not on file  Stress: Not on file  Social Connections: Moderately Integrated (04/23/2023)   Social Connection and Isolation Panel [NHANES]    Frequency of Communication with Friends and Family: More than three times a week    Frequency of Social Gatherings with Friends and Family: Once a week    Attends Religious Services: 1 to 4 times per year    Active Member of [employer] or Organizations: No    Attends Banker Meetings: Never    Marital Status: Married  Catering manager Violence: Not At Risk (04/23/2023)   Humiliation, Afraid, Rape, and Kick questionnaire    Fear of Current or Ex-Partner: No    Emotionally Abused: No    Physically Abused: No    Sexually Abused: No    Review of Systems: All negative except as stated above in HPI.  Physical Exam: Vital signs: Vitals:   04/23/23 0321 04/23/23 0550  BP: (!) 151/78 (!) 155/75  Pulse: 92 92  Resp: 20 18  Temp: 98.4 F (36.9 C) 98.3 F (36.8 C)  SpO2: 97% 99%   Last BM Date : 04/21/23 General:   Alert,  Well-developed, well-nourished, pleasant and cooperative in  NAD Lungs:  Clear throughout to auscultation.   No wheezes, crackles, or rhonchi. No acute distress. Heart:  Regular rate and rhythm; no murmurs, clicks, rubs,  or gallops. Abdomen: Soft, nontender, nondistended, bowel sound present, no peritoneal signs Mood and affect normal Alert and oriented x 3 Rectal:  Deferred  GI:  Lab Results: Recent Labs    04/22/23 1205 04/22/23 1315 04/23/23 0537  WBC 8.8  --  10.8*  HGB 5.7* 5.6* 8.0*  HCT 21.5* 21.2* 27.0*  PLT 241  --  265   BMET Recent Labs    04/22/23 1205 04/23/23 0537  NA 138 137  K  3.8 3.4*  CL 104 104  CO2 26 26  GLUCOSE 164* 67*  BUN 17 13  CREATININE 1.14 0.98  CALCIUM 8.6* 8.5*   LFT Recent Labs    04/22/23 1205 04/23/23 0537  PROT 8.2* 7.1  ALBUMIN 3.9 3.2*  AST 27 22  ALT 24 15  ALKPHOS 112 54  BILITOT 1.7* 1.0  BILIDIR <0.1  --   IBILI NOT CALCULATED  --    PT/INR No results for input(s): "LABPROT", "INR" in the last 72 hours.   Studies/Results: DG Chest 2 View Result Date: 04/22/2023 CLINICAL DATA:  Shortness of breath and cough for 3 weeks EXAM: CHEST - 2 VIEW COMPARISON:  X-ray 12/08/2020 FINDINGS: Under penetrated radiograph. Enlarged cardiopericardial silhouette. Left-sided small pleural effusion identified with some adjacent opacity. Slight prominence of the central vasculature. No pneumothorax. Underinflated x-ray. Degenerative changes of the spine. Overlapping cardiac leads. Limited lateral view. IMPRESSION: Underinflation with enlarged heart and prominence of the central vasculature. Left-sided small pleural effusion and adjacent opacity. Electronically Signed   By: Karen Kays M.D.   On: 04/22/2023 14:21    Impression/Plan: -Symptomatic anemia with hemoglobin down to 5.6.  Heme positive stool but no overt bleeding. -Iron deficiency anemia.  Likely chronic blood loss.  Recommendations -------------------------- -Management options discussed with patient and family at bedside.  They  prefer inpatient workup if possible.  Will plan for EGD and colonoscopy tomorrow. -ok to have clear liquids diet today.  Keep n.p.o. past midnight.  Risks (bleeding, infection, bowel perforation that could require surgery, sedation-related changes in cardiopulmonary systems), benefits (identification and possible treatment of source of symptoms, exclusion of certain causes of symptoms), and alternatives (watchful waiting, radiographic imaging studies, empiric medical treatment)  were explained to patient/family in detail and patient wishes to proceed.     LOS: 1 day   Kathi Der  MD, FACP 04/23/2023, 10:13 AM  Contact #  [phone]

## 2023-04-23 NOTE — H&P (View-Only) (Signed)
Referring Provider: TH Primary Care Physician:  [name], MD Primary Gastroenterologist: Gentry Fitz  Reason for Consultation: Iron deficiency anemia, heme positive stool  HPI: Raymond Munoz. is a 73 y.o. male with past medical history of GERD, diabetes and hypertension presented to the hospital with shortness of breath.  Upon initial evaluation, he was found to have anemia with hemoglobin of 5.7.  Occult blood positive.  Normal LFTs.  Iron studies shows iron deficiency anemia.  Normal vitamin B12 and folate level.  His hemoglobin was 10.7 in 2022.  Patient seen and examined at bedside.  He denies seeing any blood in the stool or black stool.  Denies any abdominal pain, nausea or vomiting.  Denies reflux, trouble swallowing or pain while swallowing.  Last colonoscopy in 2009.  No family history of colon cancer.  Denies NSAID use.  Past Medical History:  Diagnosis Date   Anxiety    Arthritis    Diabetes mellitus without complication (HCC)    Type 2   GERD (gastroesophageal reflux disease)    Hypertension     Past Surgical History:  Procedure Laterality Date   KNEE SURGERY Left    RADIOLOGY WITH ANESTHESIA N/A 08/26/2014   Procedure: MRI LUMBAR SPINE   (RADIOLOGY WITH ANESTHESIA);  Surgeon: Medication Radiologist, MD;  Location: [facility] OR;  Service: Radiology;  Laterality: N/A;    Prior to Admission medications   Medication Sig Start Date End Date Taking? Authorizing Provider  acetaminophen (TYLENOL 8 HOUR) 650 MG CR tablet Take 1 tablet (650 mg total) by mouth every 8 (eight) hours as needed for pain. 05/17/14  Yes Toy Cookey, MD  aspirin 81 MG chewable tablet Chew 81 mg by mouth daily.   Yes [provider]  fexofenadine (ALLEGRA) 180 MG tablet Take 180 mg by mouth daily.   Yes [provider]  gabapentin (NEURONTIN) 300 MG capsule Take 300-600 mg by mouth 2 (two) times daily. 300mg  in the morning and 600mg  at bedtime   Yes [provider]   gemfibrozil (LOPID) 600 MG tablet Take 600 mg by mouth 2 (two) times daily before a meal.  01/23/14  Yes [provider]  hydrochlorothiazide (HYDRODIURIL) 25 MG tablet Take 25 mg by mouth daily.  02/26/14  Yes [provider]  Insulin Glargine (LANTUS SOLOSTAR) 100 UNIT/ML Solostar Pen Inject 70 Units into the skin at bedtime. 10/16/14  Yes Danelle Berry, PA-C  metFORMIN (GLUCOPHAGE) 1000 MG tablet Take 1 tablet (1,000 mg total) by mouth daily with breakfast. 10/16/14  Yes Tapia, Leisa, PA-C  Omega 3 1200 MG CAPS Take 2,400 mg by mouth 2 (two) times daily.   Yes [provider]  omeprazole (PRILOSEC OTC) 20 MG tablet Take 20 mg by mouth daily.   Yes [provider]  vitamin C (ASCORBIC ACID) 500 MG tablet Take 500 mg by mouth daily.   Yes [provider]  glucose blood (FREESTYLE LITE) test strip Use as instructed 10/16/14   Danelle Berry, PA-C  Lancets (FREESTYLE) lancets Use as instructed 10/16/14   Danelle Berry, PA-C    Scheduled Meds:  gabapentin  300 mg Oral Daily   And   gabapentin  600 mg Oral QHS   gemfibrozil  600 mg Oral BID AC   insulin aspart  0-15 Units Subcutaneous TID WC   insulin glargine-yfgn  40 Units Subcutaneous QHS   sodium chloride flush  3 mL Intravenous Q12H   Continuous Infusions:  sodium chloride 250 mL (04/22/23 1824)  ferric gluconate (FERRLECIT) IVPB 250 mg (04/23/23 0928)   PRN Meds:.sodium chloride, acetaminophen **OR** acetaminophen, albuterol, ondansetron **OR** ondansetron (ZOFRAN) IV, sodium chloride flush  Allergies as of 04/22/2023 - Review Complete 04/22/2023  Allergen Reaction Noted   Codeine  05/05/2007   Ibuprofen  06/14/2006   Aspirin Nausea Only 05/05/2007    Family History  Problem Relation Age of Onset   Hypertension Mother    Diabetes type II Mother    Thyroid disease Mother     Social History   Socioeconomic History   Marital status: Married    Spouse name: Not on file   Number of  children: Not on file   Years of education: Not on file   Highest education level: Not on file  Occupational History   Not on file  Tobacco Use   Smoking status: Never   Smokeless tobacco: Never  Vaping Use   Vaping status: Never Used  Substance and Sexual Activity   Alcohol use: No   Drug use: No   Sexual activity: Not on file  Other Topics Concern   Not on file  Social History Narrative   Not on file   Social Drivers of Health   Financial Resource Strain: Not on file  Food Insecurity: No Food Insecurity (04/23/2023)   Hunger Vital Sign    Worried About Running Out of Food in the Last Year: Never true    Ran Out of Food in the Last Year: Never true  Transportation Needs: No Transportation Needs (04/23/2023)   PRAPARE - Administrator, Civil Service (Medical): No    Lack of Transportation (Non-Medical): No  Physical Activity: Not on file  Stress: Not on file  Social Connections: Moderately Integrated (04/23/2023)   Social Connection and Isolation Panel [NHANES]    Frequency of Communication with Friends and Family: More than three times a week    Frequency of Social Gatherings with Friends and Family: Once a week    Attends Religious Services: 1 to 4 times per year    Active Member of [employer] or Organizations: No    Attends Banker Meetings: Never    Marital Status: Married  Catering manager Violence: Not At Risk (04/23/2023)   Humiliation, Afraid, Rape, and Kick questionnaire    Fear of Current or Ex-Partner: No    Emotionally Abused: No    Physically Abused: No    Sexually Abused: No    Review of Systems: All negative except as stated above in HPI.  Physical Exam: Vital signs: Vitals:   04/23/23 0321 04/23/23 0550  BP: (!) 151/78 (!) 155/75  Pulse: 92 92  Resp: 20 18  Temp: 98.4 F (36.9 C) 98.3 F (36.8 C)  SpO2: 97% 99%   Last BM Date : 04/21/23 General:   Alert,  Well-developed, well-nourished, pleasant and cooperative in  NAD Lungs:  Clear throughout to auscultation.   No wheezes, crackles, or rhonchi. No acute distress. Heart:  Regular rate and rhythm; no murmurs, clicks, rubs,  or gallops. Abdomen: Soft, nontender, nondistended, bowel sound present, no peritoneal signs Mood and affect normal Alert and oriented x 3 Rectal:  Deferred  GI:  Lab Results: Recent Labs    04/22/23 1205 04/22/23 1315 04/23/23 0537  WBC 8.8  --  10.8*  HGB 5.7* 5.6* 8.0*  HCT 21.5* 21.2* 27.0*  PLT 241  --  265   BMET Recent Labs    04/22/23 1205 04/23/23 0537  NA 138 137  K  3.8 3.4*  CL 104 104  CO2 26 26  GLUCOSE 164* 67*  BUN 17 13  CREATININE 1.14 0.98  CALCIUM 8.6* 8.5*   LFT Recent Labs    04/22/23 1205 04/23/23 0537  PROT 8.2* 7.1  ALBUMIN 3.9 3.2*  AST 27 22  ALT 24 15  ALKPHOS 112 54  BILITOT 1.7* 1.0  BILIDIR <0.1  --   IBILI NOT CALCULATED  --    PT/INR No results for input(s): "LABPROT", "INR" in the last 72 hours.   Studies/Results: DG Chest 2 View Result Date: 04/22/2023 CLINICAL DATA:  Shortness of breath and cough for 3 weeks EXAM: CHEST - 2 VIEW COMPARISON:  X-ray 12/08/2020 FINDINGS: Under penetrated radiograph. Enlarged cardiopericardial silhouette. Left-sided small pleural effusion identified with some adjacent opacity. Slight prominence of the central vasculature. No pneumothorax. Underinflated x-ray. Degenerative changes of the spine. Overlapping cardiac leads. Limited lateral view. IMPRESSION: Underinflation with enlarged heart and prominence of the central vasculature. Left-sided small pleural effusion and adjacent opacity. Electronically Signed   By: Karen Kays M.D.   On: 04/22/2023 14:21    Impression/Plan: -Symptomatic anemia with hemoglobin down to 5.6.  Heme positive stool but no overt bleeding. -Iron deficiency anemia.  Likely chronic blood loss.  Recommendations -------------------------- -Management options discussed with patient and family at bedside.  They  prefer inpatient workup if possible.  Will plan for EGD and colonoscopy tomorrow. -ok to have clear liquids diet today.  Keep n.p.o. past midnight.  Risks (bleeding, infection, bowel perforation that could require surgery, sedation-related changes in cardiopulmonary systems), benefits (identification and possible treatment of source of symptoms, exclusion of certain causes of symptoms), and alternatives (watchful waiting, radiographic imaging studies, empiric medical treatment)  were explained to patient/family in detail and patient wishes to proceed.     LOS: 1 day   Kathi Der  MD, FACP 04/23/2023, 10:13 AM  Contact #  [phone]

## 2023-04-23 NOTE — Progress Notes (Signed)
[facility] Progress Note  Patient: Raymond Munoz.     LKG:[id number]  DOA: 04/22/2023   PCP: [name], MD       Brief hospital course: Raymond Munoz. is a 73 y.o. male with medical history of diabetes mellitus.  Hypertension and iron deficiency anemia who presents from home with 3 to 4 weeks of shortness of breath.  Patient states that initially the shortness of breath started with exertion and he had trouble walking to his mailbox.  Is gotten worse and even moving around in the bed makes him short of breath.  He was found to be anemic.  He does not recall any blood in the stool other than once when wiping.  No complaints of having an ulcer in the past.  No issues with heartburn or poor appetite.  No issues with weight loss or change in bowel movements. He also admits to having anxiety because of the shortness of breath and has had insomnia related to. He admits to 3 pillow orthopnea and mild pedal edema which apparently also recently.  No history of heart issues in the past, no chest pain at rest or with exertion and no palpitations.    Subjective:  Received blood and did well with it. Dyspnea has resolved as it his anxiety related to it.   Assessment and Plan: Principal Problem:   Symptomatic anemia with shortness of breath with minimal exertion -Hemoglobin 5.7-last hemoglobin was in 2022 and was 10.7 - Apparently has a history of iron deficiency but he is not aware of this -Microcytosis noted - transfused 2 units of packed red blood cells -Anemia panel reviewed-will give IV Iron today as he continues to be Iron deficient  -Will need oral iron when discharged and close follow-up as outpatient   Active Problems: Hemoccult positive -[facility] consulted by ED-patient will have endoscopies tomorrow -Hold aspirin for now-no history of CVA or MI     DM (diabetes mellitus), type 2 -He takes Lantus-70 units nightly-started him on 40 while in the hospital -Continue  sliding scale insulin - Hold metformin  Hypokalemia - Replaced     Essential hypertension -Hold hydrochlorothiazide   Anxiety and insomnia -Due to current illness-improved with blood transfusions and resolution of dyspnea   Pedal edema -Follow    Body mass index is 30.04 kg/m.     Code Status: Limited: Do not attempt resuscitation (DNR) -DNR-LIMITED -Do Not Intubate/DNI  Total time on patient care: 35 minutes DVT prophylaxis:  SCDs Start: 04/22/23 1625     Objective:   Vitals:   04/23/23 0136 04/23/23 0321 04/23/23 0550 04/23/23 1337  BP: 130/68 (!) 151/78 (!) 155/75 (!) 170/92  Pulse: 93 92 92 93  Resp: 17 20 18 18   Temp: 99.3 F (37.4 C) 98.4 F (36.9 C) 98.3 F (36.8 C) 98.8 F (37.1 C)  TempSrc:  Oral Oral   SpO2: 94% 97% 99% 93%  Weight:      Height:       Filed Weights   04/22/23 1102  Weight: 87 kg   Exam: General exam: Appears comfortable  HEENT: oral mucosa moist Respiratory system: Clear to auscultation.  Cardiovascular system: S1 & S2 heard  Gastrointestinal system: Abdomen soft, non-tender, nondistended. Normal bowel sounds   Extremities: No cyanosis, clubbing, -mild pedal edema  psychiatry:  Mood & affect appropriate.      CBC: Recent Labs  Lab 04/22/23 1205 04/22/23 1315 04/23/23 0537  WBC 8.8  --  10.8*  NEUTROABS 6.4  --   --   HGB 5.7* 5.6* 8.0*  HCT 21.5* 21.2* 27.0*  MCV 75.4*  --  75.6*  PLT 241  --  265   Basic Metabolic Panel: Recent Labs  Lab 04/22/23 1205 04/23/23 0537  NA 138 137  K 3.8 3.4*  CL 104 104  CO2 26 26  GLUCOSE 164* 67*  BUN 17 13  CREATININE 1.14 0.98  CALCIUM 8.6* 8.5*     Scheduled Meds:  gabapentin  300 mg Oral Daily   And   gabapentin  600 mg Oral QHS   gemfibrozil  600 mg Oral BID AC   insulin aspart  0-15 Units Subcutaneous TID WC   insulin glargine-yfgn  40 Units Subcutaneous QHS   sodium chloride flush  3 mL Intravenous Q12H    Imaging and lab data personally reviewed    Author: Calvert Cantor  04/23/2023 5:16 PM  To contact [facility]>   Check the care team in [facility] and look for the attending/consulting [facility] provider listed  Log into [email or web address] and use [facility]'s universal password   Go to> "[facility]"  and find provider  If you still have difficulty reaching the provider, please page the [facility] (Director on Call) for the Hospitalists listed on amion

## 2023-04-23 NOTE — Plan of Care (Signed)
  Problem: Education: Goal: Ability to describe self-care measures that may prevent or decrease complications (Diabetes Survival Skills Education) will improve Outcome: Progressing   Problem: Metabolic: Goal: Ability to maintain appropriate glucose levels will improve Outcome: Progressing   Problem: Nutritional: Goal: Maintenance of adequate nutrition will improve Outcome: Progressing   Problem: Clinical Measurements: Goal: Will remain free from infection Outcome: Progressing   Problem: Nutrition: Goal: Adequate nutrition will be maintained Outcome: Progressing

## 2023-04-24 ENCOUNTER — Inpatient Hospital Stay (HOSPITAL_COMMUNITY): Payer: Medicare Other | Admitting: Certified Registered Nurse Anesthetist

## 2023-04-24 ENCOUNTER — Encounter (HOSPITAL_COMMUNITY)
Admission: EM | Disposition: A | Discharge status: Home / Self Care | Payer: Self-pay | Source: Home / Self Care | Attending: Internal Medicine

## 2023-04-24 DIAGNOSIS — K5140 Inflammatory polyps of colon without complications: Secondary | ICD-10-CM

## 2023-04-24 DIAGNOSIS — D649 Anemia, unspecified: Secondary | ICD-10-CM | POA: Diagnosis not present

## 2023-04-24 DIAGNOSIS — K3189 Other diseases of stomach and duodenum: Secondary | ICD-10-CM | POA: Diagnosis not present

## 2023-04-24 DIAGNOSIS — I10 Essential (primary) hypertension: Secondary | ICD-10-CM

## 2023-04-24 DIAGNOSIS — K648 Other hemorrhoids: Secondary | ICD-10-CM | POA: Diagnosis not present

## 2023-04-24 HISTORY — PX: POLYPECTOMY: SHX5525

## 2023-04-24 HISTORY — PX: BIOPSY: SHX5522

## 2023-04-24 HISTORY — PX: COLONOSCOPY WITH PROPOFOL: SHX5780

## 2023-04-24 HISTORY — PX: ESOPHAGOGASTRODUODENOSCOPY (EGD) WITH PROPOFOL: SHX5813

## 2023-04-24 LAB — CBC
HCT: 27.8 % — ABNORMAL LOW (ref 39.0–52.0)
Hemoglobin: 7.9 g/dL — ABNORMAL LOW (ref 13.0–17.0)
MCH: 22.2 pg — ABNORMAL LOW (ref 26.0–34.0)
MCHC: 28.4 g/dL — ABNORMAL LOW (ref 30.0–36.0)
MCV: 78.1 fL — ABNORMAL LOW (ref 80.0–100.0)
Platelets: 327 10*3/uL (ref 150–400)
RBC: 3.56 MIL/uL — ABNORMAL LOW (ref 4.22–5.81)
RDW: 18.5 % — ABNORMAL HIGH (ref 11.5–15.5)
WBC: 11.1 10*3/uL — ABNORMAL HIGH (ref 4.0–10.5)
nRBC: 0.5 % — ABNORMAL HIGH (ref 0.0–0.2)

## 2023-04-24 LAB — BPAM RBC
Blood Product Expiration Date: 202502152359
ISSUE DATE / TIME: 202501202125
ISSUE DATE / TIME: 202501210040
ISSUE DATE / TIME: 202502152359
Unit Type and Rh: 202502152359
Unit Type and Rh: 202502152359
Unit Type and Rh: 5100
Unit Type and Rh: 5100

## 2023-04-24 LAB — TYPE AND SCREEN
ABO/RH(D): O POS
Antibody Screen: NEGATIVE
Unit division: 0
Unit division: 0

## 2023-04-24 LAB — GLUCOSE, CAPILLARY
Glucose-Capillary: 108 mg/dL — ABNORMAL HIGH (ref 70–99)
Glucose-Capillary: 124 mg/dL — ABNORMAL HIGH (ref 70–99)
Glucose-Capillary: 97 mg/dL (ref 70–99)

## 2023-04-24 LAB — BASIC METABOLIC PANEL
Anion gap: 7 (ref 5–15)
BUN: 9 mg/dL (ref 8–23)
CO2: 25 mmol/L (ref 22–32)
Calcium: 8.8 mg/dL — ABNORMAL LOW (ref 8.9–10.3)
Chloride: 109 mmol/L (ref 98–111)
Creatinine, Ser: 0.85 mg/dL (ref 0.61–1.24)
GFR, Estimated: >60 mL/min (ref >60)
Glucose, Bld: 116 mg/dL — ABNORMAL HIGH (ref 70–99)
Potassium: 4.3 mmol/L (ref 3.5–5.1)
Sodium: 141 mmol/L (ref 135–145)

## 2023-04-24 SURGERY — ESOPHAGOGASTRODUODENOSCOPY (EGD) WITH PROPOFOL
Anesthesia: Monitor Anesthesia Care

## 2023-04-24 MED ORDER — SODIUM CHLORIDE 0.9 % IV SOLN: INTRAVENOUS | Status: DC | PRN

## 2023-04-24 MED ORDER — LANTUS SOLOSTAR 100 UNIT/ML ~~LOC~~ SOPN: 40.0000 [IU] | PEN_INJECTOR | Freq: Every day | SUBCUTANEOUS | 0 refills | Status: AC

## 2023-04-24 MED ORDER — PROPOFOL 10 MG/ML IV BOLUS
INTRAVENOUS | Status: DC | PRN
  Administered 2023-04-24: 30 mg via INTRAVENOUS
  Administered 2023-04-24: 20 mg via INTRAVENOUS

## 2023-04-24 MED ORDER — OMEPRAZOLE MAGNESIUM 20 MG PO TBEC: 40.0000 mg | DELAYED_RELEASE_TABLET | Freq: Every day | ORAL | 0 refills | Status: AC

## 2023-04-24 MED ORDER — HYDROCHLOROTHIAZIDE 25 MG PO TABS
25.0000 mg | ORAL_TABLET | Freq: Every day | ORAL | Status: DC
  Administered 2023-04-24: 25 mg via ORAL
  Filled 2023-04-24: qty 1

## 2023-04-24 MED ORDER — PROPOFOL 500 MG/50ML IV EMUL
INTRAVENOUS | Status: DC | PRN
  Administered 2023-04-24: 125 ug/kg/min via INTRAVENOUS

## 2023-04-24 SURGICAL SUPPLY — 24 items
BLOCK BITE 60FR ADLT L/F BLUE (MISCELLANEOUS) ×3 IMPLANT
ELECT REM PT RETURN 9FT ADLT (ELECTROSURGICAL)
ELECTRODE REM PT RTRN 9FT ADLT (ELECTROSURGICAL) IMPLANT
FCP BXJMBJMB 240X2.8X (CUTTING FORCEPS)
FLOOR PAD 36X40 (MISCELLANEOUS) ×2
FORCEP RJ3 GP 1.8X160 W-NEEDLE (CUTTING FORCEPS) IMPLANT
FORCEPS BIOP RAD 4 LRG CAP 4 (CUTTING FORCEPS) IMPLANT
FORCEPS BIOP RJ4 240 W/NDL (CUTTING FORCEPS)
FORCEPS BXJMBJMB 240X2.8X (CUTTING FORCEPS) IMPLANT
INJECTOR/SNARE I SNARE (MISCELLANEOUS) IMPLANT
LUBRICANT JELLY 4.5OZ STERILE (MISCELLANEOUS) IMPLANT
MANIFOLD NEPTUNE II (INSTRUMENTS) IMPLANT
NDL SCLEROTHERAPY 25GX240 (NEEDLE) IMPLANT
NEEDLE SCLEROTHERAPY 25GX240 (NEEDLE) IMPLANT
PAD FLOOR 36X40 (MISCELLANEOUS) ×3 IMPLANT
PROBE APC STR FIRE (PROBE) IMPLANT
PROBE INJECTION GOLD 7FR (MISCELLANEOUS) IMPLANT
SNARE ROTATE MED OVAL 20MM (MISCELLANEOUS) IMPLANT
SNARE SHORT THROW 13M SML OVAL (MISCELLANEOUS) IMPLANT
SYR 50ML LL SCALE MARK (SYRINGE) IMPLANT
TRAP SPECIMEN MUCOUS 40CC (MISCELLANEOUS) IMPLANT
TUBING ENDO SMARTCAP PENTAX (MISCELLANEOUS) ×6 IMPLANT
TUBING IRRIGATION ENDOGATOR (MISCELLANEOUS) ×3 IMPLANT
WATER STERILE IRR 1000ML POUR (IV SOLUTION) IMPLANT

## 2023-04-24 NOTE — Discharge Summary (Signed)
Physician Discharge Summary  Raymond Munoz. WJX:[id number] DOB: March 24, 1951 DOA: 04/22/2023  PCP: [name], MD  Admit date: 04/22/2023 Discharge date: 04/24/2023  Admitted From: Home Disposition: Home  Recommendations for Outpatient Follow-up:  Follow up with PCP in 1-2 weeks Follow-up with GI in 3 months as discussed  Discharge Condition: Stable CODE STATUS: DNR Diet recommendation: Low residual soft diet, advance per GI as discussed  Brief/Interim Summary: Raymond Munoz. is a 73 y.o. male with medical history of diabetes mellitus.  Hypertension and iron deficiency anemia who presents from home with 3 to 4 weeks of shortness of breath.   Patient admitted as above with acute symptomatic anemia, hemoglobin 5.6, improved with transfusion.  Underwent endoscopy(upper and lower) without overt signs of acute bleeding.  Given stable hemoglobin posttransfusion and no clear signs or symptoms of bleeding patient otherwise stable and agreeable for discharge home.  Discussed with patient possible need for pill endoscopy in 3 months time per GI which we discussed further at his follow-up.  Otherwise continue home medications as previous, increase PPI dose x 1 month.   Discharge Diagnoses:  Principal Problem:   Symptomatic anemia Active Problems:   DM (diabetes mellitus), type 2 (HCC)   ANEMIA-IRON DEFICIENCY   Essential hypertension  Symptomatic anemia with shortness of breath with minimal exertion -Hemoglobin trough 5.6-last hemoglobin was in 2022 and was 10.7 - transfused 2 units of packed red blood cells -Discussed increased p.o. iron intake -[facility] consulted by ED-status post endoscopy, upper and lower without overt findings of bleeding -Resume 81 mg aspirin  DM (diabetes mellitus), type 2 -Previously on 70 units Lantus at bedtime, well-controlled in the hospital on 40 unit at bedtime with appropriate diabetic diet change home insulin to 40 units, discussed improving diet  diet - Resume metformin   Hypokalemia -Resolved  Essential hypertension -Resume HCTZ   Body mass index is 30.04 kg/m.  Discharge Instructions  Discharge Instructions     Call MD for:  difficulty breathing, headache or visual disturbances   Complete by: As directed    Call MD for:  extreme fatigue   Complete by: As directed    Call MD for:  persistant dizziness or light-headedness   Complete by: As directed    Call MD for:  persistant nausea and vomiting   Complete by: As directed    Call MD for:  severe uncontrolled pain   Complete by: As directed    Call MD for:  temperature >100.4   Complete by: As directed    Diet - low sodium heart healthy   Complete by: As directed    Discharge instructions   Complete by: As directed    Follow up with GI in 3 month's time to discuss symptoms/anemia and if necessary capsule endoscopy. Continue to increase leafy green/iron intake as discussed.   Increase activity slowly   Complete by: As directed       Allergies as of 04/24/2023       Reactions   Codeine    unknown   Ibuprofen    REACTION: unspecified   Aspirin Nausea Only   Irritates stomach-can take 81 mg        Medication List     STOP taking these medications    acetaminophen 650 MG CR tablet Commonly known as: Tylenol 8 Hour       TAKE these medications    ascorbic acid 500 MG tablet Commonly known as: VITAMIN C Take 500 mg by mouth  daily.   aspirin 81 MG chewable tablet Chew 81 mg by mouth daily.   fexofenadine 180 MG tablet Commonly known as: ALLEGRA Take 180 mg by mouth daily.   freestyle lancets Use as instructed   gabapentin 300 MG capsule Commonly known as: NEURONTIN Take 300-600 mg by mouth 2 (two) times daily. 300mg  in the morning and 600mg  at bedtime   gemfibrozil 600 MG tablet Commonly known as: LOPID Take 600 mg by mouth 2 (two) times daily before a meal.   glucose blood test strip Commonly known as: FREESTYLE LITE Use as  instructed   hydrochlorothiazide 25 MG tablet Commonly known as: HYDRODIURIL Take 25 mg by mouth daily.   Lantus SoloStar 100 UNIT/ML Solostar Pen Generic drug: insulin glargine Inject 40 Units into the skin at bedtime. What changed: how much to take   metFORMIN 1000 MG tablet Commonly known as: GLUCOPHAGE Take 1 tablet (1,000 mg total) by mouth daily with breakfast.   Omega 3 1200 MG Caps Take 2,400 mg by mouth 2 (two) times daily.   omeprazole 20 MG tablet Commonly known as: PRILOSEC OTC Take 2 tablets (40 mg total) by mouth daily. What changed: how much to take        Allergies  Allergen Reactions   Codeine     unknown   Ibuprofen     REACTION: unspecified   Aspirin Nausea Only    Irritates stomach-can take 81 mg    Consultations: GI  Procedures/Studies: DG Chest 2 View Result Date: 04/22/2023 CLINICAL DATA:  Shortness of breath and cough for 3 weeks EXAM: CHEST - 2 VIEW COMPARISON:  X-ray 12/08/2020 FINDINGS: Under penetrated radiograph. Enlarged cardiopericardial silhouette. Left-sided small pleural effusion identified with some adjacent opacity. Slight prominence of the central vasculature. No pneumothorax. Underinflated x-ray. Degenerative changes of the spine. Overlapping cardiac leads. Limited lateral view. IMPRESSION: Underinflation with enlarged heart and prominence of the central vasculature. Left-sided small pleural effusion and adjacent opacity. Electronically Signed   By: Karen Kays M.D.   On: 04/22/2023 14:21     Subjective: No acute issues or events overnight denies nausea vomiting diarrhea constipation headache fevers chills or chest pain   Discharge Exam: Vitals:   04/24/23 0930 04/24/23 0940  BP: (!) 161/85 (!) 185/84  Pulse: 93 94  Resp: 20 17  Temp:    SpO2: 93% 98%   Vitals:   04/24/23 0756 04/24/23 0920 04/24/23 0930 04/24/23 0940  BP: (!) 171/82 111/66 (!) 161/85 (!) 185/84  Pulse: 95 90 93 94  Resp: 18 (!) 22 20 17   Temp: 98.1  F (36.7 C) 97.8 F (36.6 C)    TempSrc: Temporal Temporal    SpO2: 93% 93% 93% 98%  Weight:      Height:        General: Pt is alert, awake, not in acute distress Cardiovascular: RRR, S1/S2 +, no rubs, no gallops Respiratory: CTA bilaterally, no wheezing, no rhonchi Abdominal: Soft, NT, ND, bowel sounds + Extremities: no edema, no cyanosis    The results of significant diagnostics from this hospitalization (including imaging, microbiology, ancillary and laboratory) are listed below for reference.     Microbiology: Recent Results (from the past 240 hours)  Resp panel by RT-PCR (RSV, Flu A&B, Covid) Anterior Nasal Swab     Status: None   Collection Time: 04/22/23 11:06 AM   Specimen: Anterior Nasal Swab  Result Value Ref Range Status   SARS Coronavirus 2 by RT PCR NEGATIVE NEGATIVE Final  Comment: (NOTE) SARS-CoV-2 target nucleic acids are NOT DETECTED.  The SARS-CoV-2 RNA is generally detectable in upper respiratory specimens during the acute phase of infection. The lowest concentration of SARS-CoV-2 viral copies this assay can detect is 138 copies/mL. A negative result does not preclude SARS-Cov-2 infection and should not be used as the sole basis for treatment or other patient management decisions. A negative result may occur with  improper specimen collection/handling, submission of specimen other than nasopharyngeal swab, presence of viral mutation(s) within the areas targeted by this assay, and inadequate number of viral copies(<138 copies/mL). A negative result must be combined with clinical observations, patient history, and epidemiological information. The expected result is Negative.  Fact Sheet for Patients:  BloggerCourse.com  Fact Sheet for Healthcare Providers:  SeriousBroker.it  This test is no t yet approved or cleared by the [place] FDA and  has been authorized for detection and/or diagnosis of  SARS-CoV-2 by FDA under an Emergency Use Authorization (EUA). This EUA will remain  in effect (meaning this test can be used) for the duration of the COVID-19 declaration under Section 564(b)(1) of the Act, 21 U.S.C.section 360bbb-3(b)(1), unless the authorization is terminated  or revoked sooner.       Influenza A by PCR NEGATIVE NEGATIVE Final   Influenza B by PCR NEGATIVE NEGATIVE Final    Comment: (NOTE) The Xpert Xpress SARS-CoV-2/FLU/RSV plus assay is intended as an aid in the diagnosis of influenza from Nasopharyngeal swab specimens and should not be used as a sole basis for treatment. Nasal washings and aspirates are unacceptable for Xpert Xpress SARS-CoV-2/FLU/RSV testing.  Fact Sheet for Patients: BloggerCourse.com  Fact Sheet for Healthcare Providers: SeriousBroker.it  This test is not yet approved or cleared by the [place] FDA and has been authorized for detection and/or diagnosis of SARS-CoV-2 by FDA under an Emergency Use Authorization (EUA). This EUA will remain in effect (meaning this test can be used) for the duration of the COVID-19 declaration under Section 564(b)(1) of the Act, 21 U.S.C. section 360bbb-3(b)(1), unless the authorization is terminated or revoked.     Resp Syncytial Virus by PCR NEGATIVE NEGATIVE Final    Comment: (NOTE) Fact Sheet for Patients: BloggerCourse.com  Fact Sheet for Healthcare Providers: SeriousBroker.it  This test is not yet approved or cleared by the [place] FDA and has been authorized for detection and/or diagnosis of SARS-CoV-2 by FDA under an Emergency Use Authorization (EUA). This EUA will remain in effect (meaning this test can be used) for the duration of the COVID-19 declaration under Section 564(b)(1) of the Act, 21 U.S.C. section 360bbb-3(b)(1), unless the authorization is terminated  or revoked.  Performed at [facility], [address], [place], [place] [address]      Labs: BNP (last 3 results) Recent Labs    04/22/23 1205  BNP 109.4*   Basic Metabolic Panel: Recent Labs  Lab 04/22/23 1205 04/23/23 0537 04/24/23 0522  NA 138 137 141  K 3.8 3.4* 4.3  CL 104 104 109  CO2 26 26 25   GLUCOSE 164* 67* 116*  BUN 17 13 9   CREATININE 1.14 0.98 0.85  CALCIUM 8.6* 8.5* 8.8*   Liver Function Tests: Recent Labs  Lab 04/22/23 1205 04/23/23 0537  AST 27 22  ALT 24 15  ALKPHOS 112 54  BILITOT 1.7* 1.0  PROT 8.2* 7.1  ALBUMIN 3.9 3.2*   No results for input(s): "LIPASE", "AMYLASE" in the last 168 hours. No results for input(s): "AMMONIA" in the  last 168 hours. CBC: Recent Labs  Lab 04/22/23 1205 04/22/23 1315 04/23/23 0537 04/24/23 0522  WBC 8.8  --  10.8* 11.1*  NEUTROABS 6.4  --   --   --   HGB 5.7* 5.6* 8.0* 7.9*  HCT 21.5* 21.2* 27.0* 27.8*  MCV 75.4*  --  75.6* 78.1*  PLT 241  --  265 327   Cardiac Enzymes: No results for input(s): "CKTOTAL", "CKMB", "CKMBINDEX", "TROPONINI" in the last 168 hours. BNP: Invalid input(s): "POCBNP" CBG: Recent Labs  Lab 04/23/23 1602 04/23/23 2036 04/24/23 0014 04/24/23 0425 04/24/23 0727  GLUCAP 116* 110* 108* 124* 97   D-Dimer No results for input(s): "DDIMER" in the last 72 hours. Hgb A1c Recent Labs    04/22/23 1756  HGBA1C 8.4*   Lipid Profile No results for input(s): "CHOL", "HDL", "LDLCALC", "TRIG", "CHOLHDL", "LDLDIRECT" in the last 72 hours. Thyroid function studies No results for input(s): "TSH", "T4TOTAL", "T3FREE", "THYROIDAB" in the last 72 hours.  Invalid input(s): "FREET3" Anemia work up Recent Labs    04/22/23 1756  VITAMINB12 818  FOLATE 20.5  FERRITIN 7*  TIBC 591*  IRON 15*  RETICCTPCT 2.4   Urinalysis    Component Value Date/Time   COLORURINE YELLOW 02/08/2015 1818   APPEARANCEUR CLEAR 02/08/2015 1818   LABSPEC 1.036 (H) 02/08/2015 1818    PHURINE 5.5 02/08/2015 1818   GLUCOSEU >1000 (A) 02/08/2015 1818   HGBUR NEGATIVE 02/08/2015 1818   BILIRUBINUR NEGATIVE 02/08/2015 1818   KETONESUR NEGATIVE 02/08/2015 1818   PROTEINUR NEGATIVE 02/08/2015 1818   UROBILINOGEN 0.2 02/08/2015 1818   NITRITE NEGATIVE 02/08/2015 1818   LEUKOCYTESUR NEGATIVE 02/08/2015 1818   Sepsis Labs Recent Labs  Lab 04/22/23 1205 04/23/23 0537 04/24/23 0522  WBC 8.8 10.8* 11.1*   Microbiology Recent Results (from the past 240 hours)  Resp panel by RT-PCR (RSV, Flu A&B, Covid) Anterior Nasal Swab     Status: None   Collection Time: 04/22/23 11:06 AM   Specimen: Anterior Nasal Swab  Result Value Ref Range Status   SARS Coronavirus 2 by RT PCR NEGATIVE NEGATIVE Final    Comment: (NOTE) SARS-CoV-2 target nucleic acids are NOT DETECTED.  The SARS-CoV-2 RNA is generally detectable in upper respiratory specimens during the acute phase of infection. The lowest concentration of SARS-CoV-2 viral copies this assay can detect is 138 copies/mL. A negative result does not preclude SARS-Cov-2 infection and should not be used as the sole basis for treatment or other patient management decisions. A negative result may occur with  improper specimen collection/handling, submission of specimen other than nasopharyngeal swab, presence of viral mutation(s) within the areas targeted by this assay, and inadequate number of viral copies(<138 copies/mL). A negative result must be combined with clinical observations, patient history, and epidemiological information. The expected result is Negative.  Fact Sheet for Patients:  BloggerCourse.com  Fact Sheet for Healthcare Providers:  SeriousBroker.it  This test is no t yet approved or cleared by the [place] FDA and  has been authorized for detection and/or diagnosis of SARS-CoV-2 by FDA under an Emergency Use Authorization (EUA). This EUA will remain  in  effect (meaning this test can be used) for the duration of the COVID-19 declaration under Section 564(b)(1) of the Act, 21 U.S.C.section 360bbb-3(b)(1), unless the authorization is terminated  or revoked sooner.       Influenza A by PCR NEGATIVE NEGATIVE Final   Influenza B by PCR NEGATIVE NEGATIVE Final    Comment: (NOTE) The Xpert Xpress SARS-CoV-2/FLU/RSV  plus assay is intended as an aid in the diagnosis of influenza from Nasopharyngeal swab specimens and should not be used as a sole basis for treatment. Nasal washings and aspirates are unacceptable for Xpert Xpress SARS-CoV-2/FLU/RSV testing.  Fact Sheet for Patients: BloggerCourse.com  Fact Sheet for Healthcare Providers: SeriousBroker.it  This test is not yet approved or cleared by the [place] FDA and has been authorized for detection and/or diagnosis of SARS-CoV-2 by FDA under an Emergency Use Authorization (EUA). This EUA will remain in effect (meaning this test can be used) for the duration of the COVID-19 declaration under Section 564(b)(1) of the Act, 21 U.S.C. section 360bbb-3(b)(1), unless the authorization is terminated or revoked.     Resp Syncytial Virus by PCR NEGATIVE NEGATIVE Final    Comment: (NOTE) Fact Sheet for Patients: BloggerCourse.com  Fact Sheet for Healthcare Providers: SeriousBroker.it  This test is not yet approved or cleared by the [place] FDA and has been authorized for detection and/or diagnosis of SARS-CoV-2 by FDA under an Emergency Use Authorization (EUA). This EUA will remain in effect (meaning this test can be used) for the duration of the COVID-19 declaration under Section 564(b)(1) of the Act, 21 U.S.C. section 360bbb-3(b)(1), unless the authorization is terminated or revoked.  Performed at [facility], [address] [place], [place] [address]       Time coordinating discharge: Over 30 minutes  SIGNED:   Azucena Fallen, DO [facility] 04/24/2023, 11:31 AM Pager   If 7PM-7AM, please contact night-coverage [email or web address]

## 2023-04-24 NOTE — Op Note (Signed)
[facility] Patient Name: Raymond Munoz Procedure Date: 04/24/2023 MRN: [id number] Attending MD: Kathi Der , MD, [id number] Date of Birth: 07-24-1950 CSN: [id number] Age: 73 Admit Type: Outpatient Procedure:                Upper GI endoscopy Indications:              Iron deficiency anemia, Heme positive stool Providers:                Kathi Der, MD, Stephens Shire RN, RN, Rozetta Nunnery, Technician Referring MD:              Medicines:                Sedation Administered by an Anesthesia Professional Complications:            No immediate complications. Estimated Blood Loss:     Estimated blood loss was minimal. Procedure:                Pre-Anesthesia Assessment:                           - Prior to the procedure, a History and Physical                            was performed, and patient medications and                            allergies were reviewed. The patient's tolerance of                            previous anesthesia was also reviewed. The risks                            and benefits of the procedure and the sedation                            options and risks were discussed with the patient.                            All questions were answered, and informed consent                            was obtained. Prior Anticoagulants: The patient has                            taken no anticoagulant or antiplatelet agents. ASA                            Grade Assessment: III - A patient with severe                            systemic disease. After reviewing the risks and  benefits, the patient was deemed in satisfactory                            condition to undergo the procedure.                           After obtaining informed consent, the endoscope was                            passed under direct vision. Throughout the                            procedure, the patient's blood  pressure, pulse, and                            oxygen saturations were monitored continuously. The                            [id number]-[id number] ([id number]) Olympus endoscope was introduced                            through the mouth, and advanced to the second part                            of duodenum. The upper GI endoscopy was                            accomplished without difficulty. The patient                            tolerated the procedure well. Scope In: Scope Out: Findings:      The Z-line was regular and was found 38 cm from the incisors.      Patchy mildly erythematous mucosa was found in the gastric antrum and in       the prepyloric region of the stomach. Biopsies were taken with a cold       forceps for histology.      The cardia and gastric fundus were normal on retroflexion.      The duodenal bulb, first portion of the duodenum and second portion of       the duodenum were normal. Impression:               - Z-line regular, 38 cm from the incisors.                           - Erythematous mucosa in the antrum and prepyloric                            region of the stomach. Biopsied.                           - Normal duodenal bulb, first portion of the                            duodenum and second portion  of the duodenum. Moderate Sedation:      Moderate (conscious) sedation was personally administered by an       anesthesia professional. The following parameters were monitored: oxygen       saturation, heart rate, blood pressure, and response to care. Recommendation:           - Perform a colonoscopy today. Procedure Code(s):        --- Professional ---                           [phone], Esophagogastroduodenoscopy, flexible,                            transoral; with biopsy, single or multiple Diagnosis Code(s):        --- Professional ---                           K31.89, Other diseases of stomach and duodenum                           D50.9, Iron deficiency anemia,  unspecified                           R19.5, Other fecal abnormalities CPT copyright 2022 American Medical Association. All rights reserved. The codes documented in this report are preliminary and upon coder review may  be revised to meet current compliance requirements. Kathi Der, MD Kathi Der, MD 04/24/2023 9:23:04 AM Number of Addenda: 0

## 2023-04-24 NOTE — Brief Op Note (Signed)
04/24/2023  9:33 AM  PATIENT:  Raymond Munoz.  73 y.o. male  PRE-OPERATIVE DIAGNOSIS:  Anemia  POST-OPERATIVE DIAGNOSIS:  Neptune not used* EGD: gastric bx r/o HP COLON: cecal polyp  PROCEDURE:  Procedure(s): ESOPHAGOGASTRODUODENOSCOPY (EGD) WITH PROPOFOL (N/A) COLONOSCOPY WITH PROPOFOL (N/A) BIOPSY POLYPECTOMY  SURGEON:  Surgeons and Role:    * Kathi Der, MD - Primary  Findings ------------ -EGD showed mild gastritis.  Colonoscopy showed small cecal polyp and small rectal nodule.  No evidence of active bleeding.  Recommendations ---------------------- -Recommend Prilosec 20 mg daily for 4 weeks. -No further inpatient GI workup planned. -Follow-up in GI clinic in 3 months as an outpatient for monitoring anemia.  If ongoing anemia, he may need capsule endoscopy. -Okay to discharge from GI standpoint.  GI will sign off.

## 2023-04-24 NOTE — Anesthesia Preprocedure Evaluation (Addendum)
Anesthesia Evaluation  Patient identified by MRN, date of birth, ID band Patient awake    Reviewed: Allergy & Precautions, H&P , NPO status , Patient's Chart, lab work & pertinent test results  Airway Mallampati: II  TM Distance: >3 FB Neck ROM: Full    Dental no notable dental hx. (+) Edentulous Upper, Partial Lower, Dental Advisory Given   Pulmonary asthma    Pulmonary exam normal breath sounds clear to auscultation       Cardiovascular hypertension, Pt. on medications  Rhythm:Regular Rate:Normal     Neuro/Psych   Anxiety     negative neurological ROS     GI/Hepatic Neg liver ROS,GERD  Medicated,,  Endo/Other  diabetes, Type 2, Oral Hypoglycemic Agents, Insulin Dependent    Renal/GU negative Renal ROS  negative genitourinary   Musculoskeletal  (+) Arthritis , Osteoarthritis,    Abdominal   Peds  Hematology  (+) Blood dyscrasia, anemia   Anesthesia Other Findings   Reproductive/Obstetrics negative OB ROS                             Anesthesia Physical Anesthesia Plan  ASA: 3  Anesthesia Plan: MAC   Post-op Pain Management: Minimal or no pain anticipated   Induction: Intravenous  PONV Risk Score and Plan: 1 and Propofol infusion and Treatment may vary due to age or medical condition  Airway Management Planned: Natural Airway and Simple Face Mask  Additional Equipment:   Intra-op Plan:   Post-operative Plan:   Informed Consent: I have reviewed the patients History and Physical, chart, labs and discussed the procedure including the risks, benefits and alternatives for the proposed anesthesia with the patient or authorized representative who has indicated his/her understanding and acceptance.   Patient has DNR.  Discussed DNR with patient and Suspend DNR.   Dental advisory given  Plan Discussed with: CRNA  Anesthesia Plan Comments:        Anesthesia Quick  Evaluation

## 2023-04-24 NOTE — Plan of Care (Signed)
  Problem: Fluid Volume: Goal: Ability to maintain a balanced intake and output will improve Outcome: Progressing   Problem: Health Behavior/Discharge Planning: Goal: Ability to identify and utilize available resources and services will improve Outcome: Progressing   Problem: Metabolic: Goal: Ability to maintain appropriate glucose levels will improve Outcome: Progressing   Problem: Tissue Perfusion: Goal: Adequacy of tissue perfusion will improve Outcome: Progressing   Problem: Clinical Measurements: Goal: Ability to maintain clinical measurements within normal limits will improve Outcome: Progressing Goal: Will remain free from infection Outcome: Progressing   Problem: Pain Managment: Goal: General experience of comfort will improve and/or be controlled Outcome: Progressing   Problem: Safety: Goal: Ability to remain free from injury will improve Outcome: Progressing

## 2023-04-24 NOTE — Transfer of Care (Signed)
Immediate Anesthesia Transfer of Care Note  Patient: Raymond Munoz.  Procedure(s) Performed: ESOPHAGOGASTRODUODENOSCOPY (EGD) WITH PROPOFOL COLONOSCOPY WITH PROPOFOL BIOPSY POLYPECTOMY  Patient Location: Endoscopy Unit  Anesthesia Type:MAC  Level of Consciousness: awake and patient cooperative  Airway & Oxygen Therapy: Patient Spontanous Breathing and Patient connected to face mask  Post-op Assessment: Report given to RN and Post -op Vital signs reviewed and stable  Post vital signs: Reviewed and stable  Last Vitals:  Vitals Value Taken Time  BP 111/66 04/24/23 0920  Temp    Pulse 88 04/24/23 0920  Resp 29 04/24/23 0920  SpO2 93 % 04/24/23 0920  Vitals shown include unfiled device data.  Last Pain:  Vitals:   04/24/23 0756  TempSrc: Temporal  PainSc: 0-No pain         Complications: No notable events documented.

## 2023-04-24 NOTE — Anesthesia Postprocedure Evaluation (Signed)
Anesthesia Post Note  Patient: Raymond Munoz.  Procedure(s) Performed: ESOPHAGOGASTRODUODENOSCOPY (EGD) WITH PROPOFOL COLONOSCOPY WITH PROPOFOL BIOPSY POLYPECTOMY     Patient location during evaluation: Endoscopy Anesthesia Type: MAC Level of consciousness: awake and alert Pain management: pain level controlled Vital Signs Assessment: post-procedure vital signs reviewed and stable Respiratory status: spontaneous breathing, nonlabored ventilation and respiratory function stable Cardiovascular status: stable and blood pressure returned to baseline Postop Assessment: no apparent nausea or vomiting Anesthetic complications: no  No notable events documented.  Last Vitals:  Vitals:   04/24/23 0930 04/24/23 0940  BP: (!) 161/85 (!) 185/84  Pulse: 93 94  Resp: 20 17  Temp:    SpO2: 93% 98%    Last Pain:  Vitals:   04/24/23 0940  TempSrc:   PainSc: 0-No pain                 Rajon Bisig,W. EDMOND

## 2023-04-24 NOTE — Op Note (Signed)
[facility] Patient Name: Raymond Munoz Procedure Date: 04/24/2023 MRN: [id number] Attending MD: Kathi Der , MD, [id number] Date of Birth: [birth year] CSN: [id number] Age: 72 Admit Type: Outpatient Procedure:                Colonoscopy Indications:              Heme positive stool, Iron deficiency anemia Providers:                Kathi Der, MD, Stephens Shire RN, RN, Rozetta Nunnery, Technician Referring MD:              Medicines:                Sedation Administered by an Anesthesia Professional Complications:            No immediate complications. Estimated Blood Loss:     Estimated blood loss was minimal. Procedure:                Pre-Anesthesia Assessment:                           - Prior to the procedure, a History and Physical                            was performed, and patient medications and                            allergies were reviewed. The patient's tolerance of                            previous anesthesia was also reviewed. The risks                            and benefits of the procedure and the sedation                            options and risks were discussed with the patient.                            All questions were answered, and informed consent                            was obtained. Prior Anticoagulants: The patient has                            taken no anticoagulant or antiplatelet agents. ASA                            Grade Assessment: III - A patient with severe                            systemic disease. After reviewing the risks and  benefits, the patient was deemed in satisfactory                            condition to undergo the procedure.                           After obtaining informed consent, the colonoscope                            was passed under direct vision. Throughout the                            procedure, the patient's blood pressure,  pulse, and                            oxygen saturations were monitored continuously. The                            PCF-HQ190L ([id number]) Olympus colonoscope was                            introduced through the anus and advanced to the the                            terminal ileum, with identification of the                            appendiceal orifice and IC valve. The colonoscopy                            was performed without difficulty. The patient                            tolerated the procedure well. The quality of the                            bowel preparation was fair. The terminal ileum,                            ileocecal valve, appendiceal orifice, and rectum                            were photographed. Scope In: 9:01:32 AM Scope Out: 9:13:23 AM Scope Withdrawal Time: 0 hours 9 minutes 43 seconds  Total Procedure Duration: 0 hours 11 minutes 51 seconds  Findings:      The perianal and digital rectal examinations were normal.      The terminal ileum appeared normal.      A 5 mm polyp was found in the cecum. The polyp was sessile. The polyp       was removed with a cold snare. Resection and retrieval were complete.      One 6 mm submucosal nodule was found in the rectum. Biopsies were taken       with a cold forceps for histology.      Internal hemorrhoids were found  during retroflexion. The hemorrhoids       were small. Impression:               - Preparation of the colon was fair.                           - The examined portion of the ileum was normal.                           - One 5 mm polyp in the cecum, removed with a cold                            snare. Resected and retrieved.                           - Submucosal nodule in the rectum. Biopsied.                           - Internal hemorrhoids. Moderate Sedation:      Moderate (conscious) sedation was personally administered by an       anesthesia professional. The following parameters were monitored:  oxygen       saturation, heart rate, blood pressure, and response to care. Recommendation:           - Return patient to hospital ward for ongoing care.                           - Soft diet.                           - Continue present medications.                           - Await pathology results.                           - Repeat colonoscopy date to be determined after                            pending pathology results are reviewed for                            surveillance based on pathology results.                           - Return to GI clinic in 3 months. Procedure Code(s):        --- Professional ---                           [id number], Colonoscopy, flexible; with removal of                            tumor(s), polyp(s), or other lesion(s) by snare                            technique  16109, 59, Colonoscopy, flexible; with biopsy,                            single or multiple Diagnosis Code(s):        --- Professional ---                           K64.8, Other hemorrhoids                           D12.0, Benign neoplasm of cecum                           K62.89, Other specified diseases of anus and rectum                           R19.5, Other fecal abnormalities                           D50.9, Iron deficiency anemia, unspecified CPT copyright 2022 American Medical Association. All rights reserved. The codes documented in this report are preliminary and upon coder review may  be revised to meet current compliance requirements. Kathi Der, MD Kathi Der, MD 04/24/2023 9:29:33 AM Number of Addenda: 0

## 2023-04-24 NOTE — Anesthesia Procedure Notes (Signed)
Procedure Name: MAC Date/Time: 04/24/2023 8:47 AM  Performed by: Vanessa [place], CRNAPre-anesthesia Checklist: Patient identified, Emergency Drugs available, Suction available and Patient being monitored Patient Re-evaluated:Patient Re-evaluated prior to induction Oxygen Delivery Method: Nasal cannula

## 2023-04-24 NOTE — Plan of Care (Signed)

## 2023-04-24 NOTE — Interval H&P Note (Signed)
History and Physical Interval Note:  04/24/2023 7:59 AM  Raymond Munoz.  has presented today for surgery, with the diagnosis of Anemia.  The various methods of treatment have been discussed with the patient and family. After consideration of risks, benefits and other options for treatment, the patient has consented to  Procedure(s): ESOPHAGOGASTRODUODENOSCOPY (EGD) WITH PROPOFOL (N/A) COLONOSCOPY WITH PROPOFOL (N/A) as a surgical intervention.  The patient's history has been reviewed, patient examined, no change in status, stable for surgery.  I have reviewed the patient's chart and labs.  Questions were answered to the patient's satisfaction.     Raymond Munoz

## 2023-04-25 LAB — SURGICAL PATHOLOGY

## 2023-04-26 ENCOUNTER — Encounter (HOSPITAL_COMMUNITY): Payer: Self-pay | Admitting: Gastroenterology

## 2023-04-26 NOTE — Progress Notes (Signed)
[facility] - Clinic Note  04/29/2023     CHIEF COMPLAINT Patient presents for Retina Follow Up    HISTORY OF PRESENT ILLNESS: Raymond Munoz. is a 73 y.o. male who presents to the clinic today for:   HPI     Retina Follow Up   Patient presents with  Diabetic Retinopathy.  In both eyes.  This started 1.  Duration of 1.  Since onset it is stable.  I, the attending physician,  performed the HPI with the patient and updated documentation appropriately.        Comments   Retina follow up per Dr [name] for retina clarence for cat surgery pt is reporting no vision changes noticed he is now on glaucoma timolol bid ou last reading 150       Last edited by [name], MD on 04/29/2023 12:16 PM.      Referring physician: Diona Foley, MD [address] [place],  [place] [address]  HISTORICAL INFORMATION:   Selected notes from the MEDICAL RECORD NUMBER Referred by Dr. [name] for DM exam LEE:  Ocular Hx- PMH-DM (on Janumet, Lantus and Metformin), HTN, arthritis    CURRENT MEDICATIONS: No current outpatient medications on file. (Ophthalmic Drugs)   No current facility-administered medications for this visit. (Ophthalmic Drugs)   Current Outpatient Medications (Other)  Medication Sig   aspirin 81 MG chewable tablet Chew 81 mg by mouth daily.   fexofenadine (ALLEGRA) 180 MG tablet Take 180 mg by mouth daily.   gabapentin (NEURONTIN) 300 MG capsule Take 300-600 mg by mouth 2 (two) times daily. 300mg  in the morning and 600mg  at bedtime   gemfibrozil (LOPID) 600 MG tablet Take 600 mg by mouth 2 (two) times daily before a meal.    glucose blood (FREESTYLE LITE) test strip Use as instructed   hydrochlorothiazide (HYDRODIURIL) 25 MG tablet Take 25 mg by mouth daily.    insulin glargine (LANTUS SOLOSTAR) 100 UNIT/ML Solostar Pen Inject 40 Units into the skin at bedtime.   Lancets (FREESTYLE) lancets Use as instructed   metFORMIN (GLUCOPHAGE) 1000 MG  tablet Take 1 tablet (1,000 mg total) by mouth daily with breakfast.   Omega 3 1200 MG CAPS Take 2,400 mg by mouth 2 (two) times daily.   omeprazole (PRILOSEC OTC) 20 MG tablet Take 2 tablets (40 mg total) by mouth daily.   vitamin C (ASCORBIC ACID) 500 MG tablet Take 500 mg by mouth daily.   No current facility-administered medications for this visit. (Other)   REVIEW OF SYSTEMS: ROS   Positive for: Endocrine, Eyes Negative for: Constitutional, Gastrointestinal, Neurological, Skin, Genitourinary, Musculoskeletal, HENT, Cardiovascular, Respiratory, Psychiatric, Allergic/Imm, Heme/Lymph Last edited by Etheleen Mayhew, COT on 04/29/2023  9:00 AM.     ALLERGIES Allergies  Allergen Reactions   Codeine     unknown   Ibuprofen     REACTION: unspecified   Aspirin Nausea Only    Irritates stomach-can take 81 mg   PAST MEDICAL HISTORY Past Medical History:  Diagnosis Date   Anxiety    Arthritis    Diabetes mellitus without complication (HCC)    Type 2   GERD (gastroesophageal reflux disease)    Hypertension    Past Surgical History:  Procedure Laterality Date   BIOPSY  04/24/2023   Procedure: BIOPSY;  Surgeon: Kathi Der, MD;  Location: [facility];  Service: Gastroenterology;;   COLONOSCOPY WITH PROPOFOL N/A 04/24/2023   Procedure: COLONOSCOPY WITH PROPOFOL;  Surgeon: Levora Angel,  Darcus Austin, MD;  Location: [facility];  Service: Gastroenterology;  Laterality: N/A;   ESOPHAGOGASTRODUODENOSCOPY (EGD) WITH PROPOFOL N/A 04/24/2023   Procedure: ESOPHAGOGASTRODUODENOSCOPY (EGD) WITH PROPOFOL;  Surgeon: Kathi Der, MD;  Location: [facility];  Service: Gastroenterology;  Laterality: N/A;   KNEE SURGERY Left    POLYPECTOMY  04/24/2023   Procedure: POLYPECTOMY;  Surgeon: Kathi Der, MD;  Location: [facility];  Service: Gastroenterology;;   RADIOLOGY WITH ANESTHESIA N/A 08/26/2014   Procedure: MRI LUMBAR SPINE   (RADIOLOGY WITH ANESTHESIA);  Surgeon: Medication  Radiologist, MD;  Location: [facility] OR;  Service: Radiology;  Laterality: N/A;   FAMILY HISTORY Family History  Problem Relation Age of Onset   Hypertension Mother    Diabetes type II Mother    Thyroid disease Mother    SOCIAL HISTORY Social History   Tobacco Use   Smoking status: Never   Smokeless tobacco: Never  Vaping Use   Vaping status: Never Used  Substance Use Topics   Alcohol use: No   Drug use: No       OPHTHALMIC EXAM:   Base Eye Exam     Visual Acuity (Snellen - Linear)       Right Left   Dist cc 20/70 -2 20/80   Dist ph cc NI NI  Numbers         Tonometry (Tonopen, 9:05 AM)       Right Left   Pressure 19 38  Dorz/brim os         Pupils       Pupils Dark Light Shape React APD   Right PERRL 3 2 Round Brisk None   Left PERRL 3 2 Round Brisk None         Visual Fields       Left Right    Full Full         Extraocular Movement       Right Left    Full, Ortho Full, Ortho         Neuro/Psych     Oriented x3: Yes   Mood/Affect: Normal         Dilation     Both eyes: 2.5% Phenylephrine @ 9:06 AM           Slit Lamp and Fundus Exam     Slit Lamp Exam       Right Left   Lids/Lashes Dermatochalasis - upper lid Dermatochalasis - upper lid   Conjunctiva/Sclera Nasal and temporal Pinguecula, mild melanosis Nasal and temporal Pinguecula   Cornea Arcus, trace PEE Arcus, trace fine Punctate epithelial erosions, early band K nasally   Anterior Chamber Moderate depth, clear Moderate depth, clear   Iris Round and dilated, No NVI Round and dilated, No NVI   Lens 2+ Nuclear sclerosis, 2-3+ Cortical cataract, Vacuoles 2-3+ Nuclear sclerosis with early brunescnece, 2-3+ Cortical cataract, 2-3+Posterior subcapsular cataract   Anterior Vitreous Vitreous syneresis Vitreous syneresis         Fundus Exam       Right Left   Disc Pink and Sharp, +cupping Pink and Sharp, +PPP, Thin superior rim   C/D Ratio 0.6 0.7   Macula Good  foveal reflex, central cystic changes, scattered MA / DBH Flat, Good foveal reflex, rare MA, no edema   Vessels attenuated, Tortuous attenuated, mild tortuosity   Periphery Attached, scattered MA / DBH greatest posteriorly Attached, no heme           Refraction     Wearing Rx  Sphere Cylinder Axis Add   Right [place] +0.50 093 +2.50   Left -0.25 Sphere  +2.50    Type: PAL           IMAGING AND PROCEDURES  Imaging and Procedures for @TODAY @  OCT, Retina - OU - Both Eyes       Right Eye Quality was borderline. Central Foveal Thickness: 303. Progression has worsened. Findings include normal foveal contour, no SRF, intraretinal fluid, vitreomacular adhesion (Interval development of central cystic changes / DME).   Left Eye Quality was borderline. Central Foveal Thickness: 257. Progression has been stable. Findings include normal foveal contour, no IRF, no SRF.   Notes *Images captured and stored on drive  Diagnosis / Impression:  OD: Interval development of central cystic changes / DME OS: no DME  Clinical management:  See below  Abbreviations: NFP - Normal foveal profile. CME - cystoid macular edema. PED - pigment epithelial detachment. IRF - intraretinal fluid. SRF - subretinal fluid. EZ - ellipsoid zone. ERM - epiretinal membrane. ORA - outer retinal atrophy. ORT - outer retinal tubulation. SRHM - subretinal hyper-reflective material             ASSESSMENT/PLAN:    ICD-10-CM   1. Moderate nonproliferative diabetic retinopathy of both eyes with macular edema associated with type 2 diabetes mellitus (HCC)  E11.3313 OCT, Retina - OU - Both Eyes    2. Current use of insulin (HCC)  Z79.4     3. Long term (current) use of oral hypoglycemic drugs  Z79.84     4. Primary open angle glaucoma of both eyes, unspecified glaucoma stage  H40.1130     5. Bilateral ocular hypertension  H40.053     6. 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 [name] for DME OD**  1-3. 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  - OS without DME  - last A1c was 8.4 on 01.20.25  - pt lost to follow up from May 2019-December 2022 and then from December 2022-January 2025 - The incidence, risk factors for progression, natural history and treatment options for diabetic retinopathy were discussed with patient.   - The need for close monitoring of blood glucose, blood pressure, and serum lipids, avoiding cigarette or any type of tobacco, and the need for long term follow up was also discussed with patient. - OCT shows interval development of DME OD, OS without IRF/SRF - discussed findings, prognosis, and treatment options - BCVA OD 20/70 from 20/40; OS 20/80 from 20/60-- mostly from cataract progression - no retinal or ophthalmic interventions indicated or recommended -- will hold off on anti-VEGF therapy at this time  - f/u in 5 wks -- DFE/OCT  4,5. POAG / Ocular hypertension OU  - IOP 19 OD and 38 OS today - +cupping w/ rim thinning OU - denies family hx of glaucoma - saw Dr. [name] last week and was put on timolol, but has not started it yet  6. Combined form age-related cataract OU-  - The symptoms of cataract, surgical options, and treatments and risks were discussed with patient. - discussed diagnosis and progression - under the expert management of Dr. [name] - left eye sx scheduled for February 26th with Dr. [name]  Ophthalmic Meds Ordered this visit:  No orders of the defined types were placed in this encounter.    Return in about 5 weeks (around 06/03/2023)  for NPDR w. DME OD, DFE, OCT.  There are no Patient Instructions on file for this visit.   Explained the diagnoses, plan, and follow up with the patient and they expressed understanding.  Patient expressed understanding of the importance of proper follow up care.   This document serves as a record of services  personally performed by Karie Chimera, MD, PhD. It was created on their behalf by Glee Arvin. Manson Passey, OA an ophthalmic technician. The creation of this record is the provider's dictation and/or activities during the visit.    Electronically signed by: Glee Arvin. Manson Passey, OA 04/29/23 12:17 PM  Karie Chimera, M.D., Ph.D. Diseases & Surgery of the Retina and Vitreous [facility] 04/28/2022  I have reviewed the above documentation for accuracy and completeness, and I agree with the above. Karie Chimera, M.D., Ph.D. 04/29/23 12:22 PM   Abbreviations: M myopia (nearsighted); A astigmatism; H hyperopia (farsighted); P presbyopia; Mrx spectacle prescription;  CTL contact lenses; OD right eye; OS left eye; OU both eyes  XT exotropia; ET esotropia; PEK punctate epithelial keratitis; PEE punctate epithelial erosions; DES dry eye syndrome; MGD meibomian gland dysfunction; ATs artificial tears; PFAT's preservative free artificial tears; NSC nuclear sclerotic cataract; PSC posterior subcapsular cataract; ERM epi-retinal membrane; PVD posterior vitreous detachment; RD retinal detachment; DM diabetes mellitus; DR diabetic retinopathy; NPDR non-proliferative diabetic retinopathy; PDR proliferative diabetic retinopathy; CSME clinically significant macular edema; DME diabetic macular edema; dbh dot blot hemorrhages; CWS cotton wool spot; POAG primary open angle glaucoma; C/D cup-to-disc ratio; HVF humphrey visual field; GVF goldmann visual field; OCT optical coherence tomography; IOP intraocular pressure; BRVO Branch retinal vein occlusion; CRVO central retinal vein occlusion; CRAO central retinal artery occlusion; BRAO branch retinal artery occlusion; RT retinal tear; SB scleral buckle; PPV pars plana vitrectomy; VH Vitreous hemorrhage; PRP panretinal laser photocoagulation; IVK intravitreal kenalog; VMT vitreomacular traction; MH Macular hole;  NVD neovascularization of the disc; NVE  neovascularization elsewhere; AREDS age related eye disease study; ARMD age related macular degeneration; POAG primary open angle glaucoma; EBMD epithelial/anterior basement membrane dystrophy; ACIOL anterior chamber intraocular lens; IOL intraocular lens; PCIOL posterior chamber intraocular lens; Phaco/IOL phacoemulsification with intraocular lens placement; PRK photorefractive keratectomy; LASIK laser assisted in situ keratomileusis; HTN hypertension; DM diabetes mellitus; COPD chronic obstructive pulmonary disease

## 2023-04-29 ENCOUNTER — Encounter (INDEPENDENT_AMBULATORY_CARE_PROVIDER_SITE_OTHER): Payer: Self-pay | Admitting: Ophthalmology

## 2023-04-29 ENCOUNTER — Ambulatory Visit (INDEPENDENT_AMBULATORY_CARE_PROVIDER_SITE_OTHER): Payer: Medicare Other | Admitting: Ophthalmology

## 2023-04-29 DIAGNOSIS — Z794 Long term (current) use of insulin: Secondary | ICD-10-CM

## 2023-04-29 DIAGNOSIS — H25813 Combined forms of age-related cataract, bilateral: Secondary | ICD-10-CM

## 2023-04-29 DIAGNOSIS — H40053 Ocular hypertension, bilateral: Secondary | ICD-10-CM

## 2023-04-29 DIAGNOSIS — E113313 Type 2 diabetes mellitus with moderate nonproliferative diabetic retinopathy with macular edema, bilateral: Secondary | ICD-10-CM | POA: Diagnosis not present

## 2023-04-29 DIAGNOSIS — Z7984 Long term (current) use of oral hypoglycemic drugs: Secondary | ICD-10-CM

## 2023-04-29 DIAGNOSIS — H401130 Primary open-angle glaucoma, bilateral, stage unspecified: Secondary | ICD-10-CM | POA: Diagnosis not present

## 2023-04-29 DIAGNOSIS — E119 Type 2 diabetes mellitus without complications: Secondary | ICD-10-CM

## 2023-05-04 ENCOUNTER — Emergency Department (HOSPITAL_COMMUNITY): Payer: Medicare Other

## 2023-05-04 ENCOUNTER — Other Ambulatory Visit: Payer: Self-pay

## 2023-05-04 ENCOUNTER — Encounter (HOSPITAL_COMMUNITY): Payer: Self-pay

## 2023-05-04 ENCOUNTER — Emergency Department (HOSPITAL_COMMUNITY)
Admission: EM | Admit: 2023-05-04 | Discharge: 2023-05-04 | Disposition: A | Discharge status: Home / Self Care | Payer: Medicare Other | Attending: Emergency Medicine | Admitting: Emergency Medicine

## 2023-05-04 DIAGNOSIS — J069 Acute upper respiratory infection, unspecified: Secondary | ICD-10-CM | POA: Diagnosis not present

## 2023-05-04 DIAGNOSIS — Z794 Long term (current) use of insulin: Secondary | ICD-10-CM | POA: Insufficient documentation

## 2023-05-04 DIAGNOSIS — Z7982 Long term (current) use of aspirin: Secondary | ICD-10-CM | POA: Insufficient documentation

## 2023-05-04 DIAGNOSIS — J9801 Acute bronchospasm: Secondary | ICD-10-CM | POA: Diagnosis not present

## 2023-05-04 DIAGNOSIS — Z20822 Contact with and (suspected) exposure to covid-19: Secondary | ICD-10-CM | POA: Diagnosis not present

## 2023-05-04 DIAGNOSIS — R0602 Shortness of breath: Secondary | ICD-10-CM | POA: Diagnosis present

## 2023-05-04 LAB — BASIC METABOLIC PANEL
Anion gap: 11 (ref 5–15)
BUN: 12 mg/dL (ref 8–23)
CO2: 26 mmol/L (ref 22–32)
Calcium: 8.8 mg/dL — ABNORMAL LOW (ref 8.9–10.3)
Chloride: 98 mmol/L (ref 98–111)
Creatinine, Ser: 0.88 mg/dL (ref 0.61–1.24)
GFR, Estimated: >60 mL/min (ref >60)
Glucose, Bld: 107 mg/dL — ABNORMAL HIGH (ref 70–99)
Potassium: 3.9 mmol/L (ref 3.5–5.1)
Sodium: 135 mmol/L (ref 135–145)

## 2023-05-04 LAB — CBC WITH DIFFERENTIAL/PLATELET
Abs Immature Granulocytes: 0.03 10*3/uL (ref 0.00–0.07)
Basophils Absolute: 0 10*3/uL (ref 0.0–0.1)
Basophils Relative: 0 %
Eosinophils Absolute: 0.1 10*3/uL (ref 0.0–0.5)
Eosinophils Relative: 1 %
HCT: 32.1 % — ABNORMAL LOW (ref 39.0–52.0)
Hemoglobin: 8.9 g/dL — ABNORMAL LOW (ref 13.0–17.0)
Immature Granulocytes: 0 %
Lymphocytes Relative: 10 %
Lymphs Abs: 1 10*3/uL (ref 0.7–4.0)
MCH: 22.5 pg — ABNORMAL LOW (ref 26.0–34.0)
MCHC: 27.7 g/dL — ABNORMAL LOW (ref 30.0–36.0)
MCV: 81.1 fL (ref 80.0–100.0)
Monocytes Absolute: 0.6 10*3/uL (ref 0.1–1.0)
Monocytes Relative: 6 %
Neutro Abs: 7.9 10*3/uL — ABNORMAL HIGH (ref 1.7–7.7)
Neutrophils Relative %: 83 %
Platelets: 589 10*3/uL — ABNORMAL HIGH (ref 150–400)
RBC: 3.96 MIL/uL — ABNORMAL LOW (ref 4.22–5.81)
RDW: 21.3 % — ABNORMAL HIGH (ref 11.5–15.5)
WBC: 9.6 10*3/uL (ref 4.0–10.5)
nRBC: 0 % (ref 0.0–0.2)

## 2023-05-04 LAB — RESP PANEL BY RT-PCR (RSV, FLU A&B, COVID)  RVPGX2
Influenza A by PCR: NEGATIVE
Influenza B by PCR: NEGATIVE
Resp Syncytial Virus by PCR: NEGATIVE
SARS Coronavirus 2 by RT PCR: NEGATIVE

## 2023-05-04 LAB — BRAIN NATRIURETIC PEPTIDE: B Natriuretic Peptide: 72.8 pg/mL (ref 0.0–100.0)

## 2023-05-04 MED ORDER — IPRATROPIUM-ALBUTEROL 0.5-2.5 (3) MG/3ML IN SOLN
3.0000 mL | Freq: Once | RESPIRATORY_TRACT | Status: AC
  Administered 2023-05-04: 3 mL via RESPIRATORY_TRACT
  Filled 2023-05-04: qty 3

## 2023-05-04 MED ORDER — PREDNISONE 10 MG PO TABS: 40.0000 mg | ORAL_TABLET | Freq: Every day | ORAL | 0 refills | Status: DC

## 2023-05-04 MED ORDER — IOHEXOL 350 MG/ML SOLN
75.0000 mL | Freq: Once | INTRAVENOUS | Status: AC | PRN
  Administered 2023-05-04: 75 mL via INTRAVENOUS

## 2023-05-04 MED ORDER — PREDNISONE 20 MG PO TABS
60.0000 mg | ORAL_TABLET | Freq: Once | ORAL | Status: AC
Start: 2023-05-04 — End: 2023-05-04
  Administered 2023-05-04: 60 mg via ORAL
  Filled 2023-05-04: qty 3

## 2023-05-04 MED ORDER — HYDRALAZINE HCL 20 MG/ML IJ SOLN
5.0000 mg | Freq: Once | INTRAMUSCULAR | Status: AC
  Administered 2023-05-04: 5 mg via INTRAVENOUS
  Filled 2023-05-04: qty 1

## 2023-05-04 MED ORDER — ALBUTEROL SULFATE HFA 108 (90 BASE) MCG/ACT IN AERS: 1.0000 | INHALATION_SPRAY | Freq: Four times a day (QID) | RESPIRATORY_TRACT | 0 refills | Status: AC | PRN

## 2023-05-04 MED ORDER — LORAZEPAM 2 MG/ML IJ SOLN
0.5000 mg | Freq: Once | INTRAMUSCULAR | Status: AC
  Administered 2023-05-04: 0.5 mg via INTRAVENOUS
  Filled 2023-05-04: qty 1

## 2023-05-04 NOTE — ED Notes (Signed)
Pt ambulated off of the [place]. Pt SpO2 remained between 94 and 97% on room air. Pt did not have to stop during the walk. Pt breathing remained unlabored and showed no signs of respiratory distress. Pt skin remained warm and dry. Provider notified.

## 2023-05-04 NOTE — Discharge Instructions (Signed)
 Return for any problem.  ?

## 2023-05-04 NOTE — ED Notes (Signed)
Pt transported to CT. Pt tolerated CT after ativan administration. Pt returned to room.

## 2023-05-04 NOTE — ED Notes (Signed)
Pt ambulated to the bathroom. Pt SpO2 remained in the upper 90's but pt said he "felt winded." Pt states this is his normal; he states he gets winded walking around his home.

## 2023-05-04 NOTE — ED Provider Notes (Signed)
[place] EMERGENCY DEPARTMENT AT [facility] Provider Note   CSN: [id number] Arrival date & time: 05/04/23  1025     History  Chief Complaint  Patient presents with   Shortness of Breath    Raymond Munoz. is a 73 y.o. male.  73 year old male with prior medical history as detailed below presents for evaluation.  Patient reports cough, congestion, shortness of breath x 1 week.  Patient's wife also has similar symptoms.  Patient has used OTC cough medications without improvement.  No chest pain reported.  Subjective fever reported.  No objective fever recorded.  The history is provided by the patient and medical records.       Home Medications Prior to Admission medications   Medication Sig Start Date End Date Taking? Authorizing Provider  aspirin 81 MG chewable tablet Chew 81 mg by mouth daily.    [provider]  fexofenadine (ALLEGRA) 180 MG tablet Take 180 mg by mouth daily.    [provider]  gabapentin (NEURONTIN) 300 MG capsule Take 300-600 mg by mouth 2 (two) times daily. 300mg  in the morning and 600mg  at bedtime    [provider]  gemfibrozil (LOPID) 600 MG tablet Take 600 mg by mouth 2 (two) times daily before a meal.  01/23/14   [provider]  glucose blood (FREESTYLE LITE) test strip Use as instructed 10/16/14   Danelle Berry, PA-C  hydrochlorothiazide (HYDRODIURIL) 25 MG tablet Take 25 mg by mouth daily.  02/26/14   [provider]  insulin glargine (LANTUS SOLOSTAR) 100 UNIT/ML Solostar Pen Inject 40 Units into the skin at bedtime. 04/24/23   Azucena Fallen, MD  Lancets (FREESTYLE) lancets Use as instructed 10/16/14   Danelle Berry, PA-C  metFORMIN (GLUCOPHAGE) 1000 MG tablet Take 1 tablet (1,000 mg total) by mouth daily with breakfast. 10/16/14   Danelle Berry, PA-C  Omega 3 1200 MG CAPS Take 2,400 mg by mouth 2 (two) times daily.    [provider]  omeprazole (PRILOSEC OTC) 20 MG tablet  Take 2 tablets (40 mg total) by mouth daily. 04/24/23   Azucena Fallen, MD  vitamin C (ASCORBIC ACID) 500 MG tablet Take 500 mg by mouth daily.    [provider]      Allergies    Codeine, Ibuprofen, and Aspirin    Review of Systems   Review of Systems  All other systems reviewed and are negative.   Physical Exam Updated Vital Signs BP (!) 145/76 (BP Location: Left Arm)   Pulse 96   Temp 99.4 F (37.4 C) (Oral)   Resp 16   Ht 5\' 7"  (1.702 m)   Wt 86.2 kg   SpO2 94%   BMI 29.76 kg/m  Physical Exam Vitals and nursing note reviewed.  Constitutional:      General: He is not in acute distress.    Appearance: Normal appearance. He is well-developed.  HENT:     Head: Normocephalic and atraumatic.  Eyes:     Conjunctiva/sclera: Conjunctivae normal.     Pupils: Pupils are equal, round, and reactive to light.  Cardiovascular:     Rate and Rhythm: Normal rate and regular rhythm.     Heart sounds: Normal heart sounds.  Pulmonary:     Effort: Pulmonary effort is normal. No respiratory distress.     Comments: Diffuse expiratory wheezes in all lung fields. Abdominal:     General: There is no distension.     Palpations: Abdomen is  soft.     Tenderness: There is no abdominal tenderness.  Musculoskeletal:        General: No deformity. Normal range of motion.     Cervical back: Normal range of motion and neck supple.  Skin:    General: Skin is warm and dry.  Neurological:     General: No focal deficit present.     Mental Status: He is alert and oriented to person, place, and time.     ED Results / Procedures / Treatments   Labs (all labs ordered are listed, but only abnormal results are displayed) Labs Reviewed  BASIC METABOLIC PANEL - Abnormal; Notable for the following components:      Result Value   Glucose, Bld 107 (*)    Calcium 8.8 (*)    All other components within normal limits  CBC WITH DIFFERENTIAL/PLATELET - Abnormal; Notable for the following  components:   RBC 3.96 (*)    Hemoglobin 8.9 (*)    HCT 32.1 (*)    MCH 22.5 (*)    MCHC 27.7 (*)    RDW 21.3 (*)    Platelets 589 (*)    Neutro Abs 7.9 (*)    All other components within normal limits  RESP PANEL BY RT-PCR (RSV, FLU A&B, COVID)  [id number]  BRAIN NATRIURETIC PEPTIDE    EKG EKG Interpretation Date/Time:  Saturday May 04 2023 11:25:58 EST Ventricular Rate:  97 PR Interval:  224 QRS Duration:  79 QT Interval:  333 QTC Calculation: 423 R Axis:   77  Text Interpretation: Sinus rhythm Prolonged PR interval Confirmed by Alvester Chou [phone]) on 05/04/2023 11:40:02 AM  Radiology DG Chest 2 View Result Date: 05/04/2023 CLINICAL DATA:  Nonproductive cough, shortness of breath. EXAM: CHEST - 2 VIEW COMPARISON:  04/22/2023. FINDINGS: Trachea is midline. Heart is enlarged, grossly stable. There is interstitial prominence and indistinctness bilaterally, with volume loss at the base of the left hemithorax and elevated left hemidiaphragm. No definite pleural fluid. Calcified granulomas. IMPRESSION: 1. Suspect mild pulmonary edema. 2. Pleuroparenchymal scarring at the base of the left hemithorax, adjacent to an elevated left hemidiaphragm. Electronically Signed   By: Leanna Battles M.D.   On: 05/04/2023 12:51    Procedures Procedures    Medications Ordered in ED Medications  ipratropium-albuterol (DUONEB) 0.5-2.5 (3) MG/3ML nebulizer solution 3 mL (3 mLs Nebulization Given 05/04/23 1135)    ED Course/ Medical Decision Making/ A&P                                 Medical Decision Making Amount and/or Complexity of Data Reviewed Radiology: ordered.  Risk Prescription drug management.    Medical Screen Complete  This patient presented to the ED with complaint of wheezing, cough.  This complaint involves an extensive number of treatment options. The initial differential diagnosis includes, but is not limited to, bacterial versus viral infection, PE, metabolic  abnormality, etc.  This presentation is: Acute, Chronic, Self-Limited, Previously Undiagnosed, Uncertain Prognosis, Complicated, Systemic Symptoms, and Threat to Life/Bodily Function  Patient reports several days of increased cough and congestion.  Patient with known sick contacts with similar symptoms.  Patient without significant hypoxia.  Patient is noted to be wheezy throughout all lung fields.  This is improved with nebulizer treatment and administration of steroids.  Workup including CTA chest is without evidence of pneumonia, PE, other more significant acute abnormality.  Given sick contact suspect bronchospasm related  to viral process.  Patient feels much improved after ED evaluation.  He is able to ambulate around the ED without apparent difficulty on room air.  He desires discharge home.  He declined admission and/or continued observation/ED evaluation.  Importance of close follow-up is stressed.  Strict return precautions given and understood.  Additional history obtained: External records from outside sources obtained and reviewed including prior ED visits and prior Inpatient records.    Lab Tests:  I ordered and personally interpreted labs.  The pertinent results include: CBC, BMP, BNP, COVID, flu, RSV   Imaging Studies ordered:  I ordered imaging studies including chest x-ray, CTA chest I independently visualized and interpreted obtained imaging which showed NAD I agree with the radiologist interpretation.   Cardiac Monitoring:  The patient was maintained on a cardiac monitor.  I personally viewed and interpreted the cardiac monitor which showed an underlying rhythm of: NSR   Medicines ordered:  I ordered medication including DuoNeb, Ativan, prednisone, hydralazine for bronchospasm, hypertension Reevaluation of the patient after these medicines showed that the patient: resolved   Problem List / ED Course:  Wheezing, congestion, URI,  cough   Reevaluation:  After the interventions noted above, I reevaluated the patient and found that they have: improved  Disposition:  After consideration of the diagnostic results and the patients response to treatment, I feel that the patent would benefit from close outpatient follow-up.          Final Clinical Impression(s) / ED Diagnoses Final diagnoses:  Upper respiratory tract infection, unspecified type  Bronchospasm    Rx / DC Orders ED Discharge Orders          Ordered    predniSONE (DELTASONE) 10 MG tablet  Daily        05/04/23 2154    albuterol (VENTOLIN HFA) 108 (90 Base) MCG/ACT inhaler  Every 6 hours PRN        05/04/23 2154              Wynetta Fines, MD 05/04/23 2218

## 2023-05-04 NOTE — ED Notes (Signed)
 Patient transported to CT

## 2023-05-04 NOTE — ED Notes (Addendum)
Patient returned from CT. CT tech requested ativan be given to pt and before trying another CT.

## 2023-05-04 NOTE — ED Provider Triage Note (Addendum)
Emergency Medicine Provider Triage Evaluation Note  Raymond Munoz. , a 73 y.o. male  was evaluated in triage.  Pt complains of cough and shortness of breath x1 week. Has tried OTC cough medication without improvement. Wife here for similar concern.  Review of Systems  Positive: SOB, cough Negative: CP, fever  Physical Exam  BP (!) 145/76 (BP Location: Left Arm)   Pulse 96   Temp 99.4 F (37.4 C) (Oral)   Resp 16   Ht 5\' 7"  (1.702 m)   Wt 86.2 kg   SpO2 94%   BMI 29.76 kg/m  Gen:   Awake, no distress   Resp:  Normal effort  MSK:   Moves extremities without difficulty  Other:  Wheezing appreciated on auscultation  Medical Decision Making  Medically screening exam initiated at 11:30 AM.  Appropriate orders placed.  [name] was informed that the remainder of the evaluation will be completed by another provider, this initial triage assessment does not replace that evaluation, and the importance of remaining in the ED until their evaluation is complete.  DuoNeb given in triage.   Maxwell Marion, PA-C 05/04/23 1132    Maxwell Marion, PA-C 05/04/23 [phone]

## 2023-05-04 NOTE — ED Notes (Signed)
Pt placed on nasal cannula 2 liters for supplemental oxygen

## 2023-05-04 NOTE — ED Triage Notes (Signed)
SOB since last Thursday. C/o cough with mucus, but unable to get mucus out. Pt states he was recently d/c from the hospital last Wednesday

## 2023-05-29 NOTE — Progress Notes (Signed)
 [facility] - Clinic Note  05/30/2023     CHIEF COMPLAINT Patient presents for Retina Follow Up    HISTORY OF PRESENT ILLNESS: Raymond Damiano. is a 73 y.o. male who presents to the clinic today for:   HPI     Retina Follow Up   Patient presents with  Diabetic Retinopathy.  In both eyes.  This started 5 weeks ago.  I, the attending physician,  performed the HPI with the patient and updated documentation appropriately.        Comments   Patient here for 5 weeks retina follow up for NPDR OU. Patient states vision doing alright. No eye pain.       Last edited by [name], MD on 05/30/2023  4:37 PM.    Pt has cataract sx OS on March 13th  Referring physician: [name], MD [address] [place],  [place] [address]  HISTORICAL INFORMATION:   Selected notes from the MEDICAL RECORD NUMBER Referred by Dr. [name] for DM exam LEE:  Ocular Hx- PMH-DM (on Janumet, Lantus and Metformin), HTN, arthritis    CURRENT MEDICATIONS: No current outpatient medications on file. (Ophthalmic Drugs)   No current facility-administered medications for this visit. (Ophthalmic Drugs)   Current Outpatient Medications (Other)  Medication Sig   albuterol (VENTOLIN HFA) 108 (90 Base) MCG/ACT inhaler Inhale 1-2 puffs into the lungs every 6 (six) hours as needed for wheezing or shortness of breath.   aspirin 81 MG chewable tablet Chew 81 mg by mouth daily.   fexofenadine (ALLEGRA) 180 MG tablet Take 180 mg by mouth daily.   gabapentin (NEURONTIN) 300 MG capsule Take 300-600 mg by mouth 2 (two) times daily. 300mg  in the morning and 600mg  at bedtime   gemfibrozil (LOPID) 600 MG tablet Take 600 mg by mouth 2 (two) times daily before a meal.    glucose blood (FREESTYLE LITE) test strip Use as instructed   hydrochlorothiazide (HYDRODIURIL) 25 MG tablet Take 25 mg by mouth daily.    insulin glargine (LANTUS SOLOSTAR) 100 UNIT/ML Solostar Pen Inject 40 Units into  the skin at bedtime.   Lancets (FREESTYLE) lancets Use as instructed   metFORMIN (GLUCOPHAGE) 1000 MG tablet Take 1 tablet (1,000 mg total) by mouth daily with breakfast.   Omega 3 1200 MG CAPS Take 2,400 mg by mouth 2 (two) times daily.   omeprazole (PRILOSEC OTC) 20 MG tablet Take 2 tablets (40 mg total) by mouth daily.   predniSONE (DELTASONE) 10 MG tablet Take 4 tablets (40 mg total) by mouth daily.   vitamin C (ASCORBIC ACID) 500 MG tablet Take 500 mg by mouth daily.   No current facility-administered medications for this visit. (Other)   REVIEW OF SYSTEMS: ROS   Positive for: Endocrine, Eyes Negative for: Constitutional, Gastrointestinal, Neurological, Skin, Genitourinary, Musculoskeletal, HENT, Cardiovascular, Respiratory, Psychiatric, Allergic/Imm, Heme/Lymph Last edited by Laddie Aquas, COA on 05/30/2023  9:20 AM.      ALLERGIES Allergies  Allergen Reactions   Codeine     unknown   Ibuprofen     REACTION: unspecified   Aspirin Nausea Only    Irritates stomach-can take 81 mg   PAST MEDICAL HISTORY Past Medical History:  Diagnosis Date   Anxiety    Arthritis    Diabetes mellitus without complication (HCC)    Type 2   GERD (gastroesophageal reflux disease)    Hypertension    Past Surgical History:  Procedure Laterality Date  BIOPSY  04/24/2023   Procedure: BIOPSY;  Surgeon: Kathi Der, MD;  Location: [facility];  Service: Gastroenterology;;   COLONOSCOPY WITH PROPOFOL N/A 04/24/2023   Procedure: COLONOSCOPY WITH PROPOFOL;  Surgeon: Kathi Der, MD;  Location: [facility];  Service: Gastroenterology;  Laterality: N/A;   ESOPHAGOGASTRODUODENOSCOPY (EGD) WITH PROPOFOL N/A 04/24/2023   Procedure: ESOPHAGOGASTRODUODENOSCOPY (EGD) WITH PROPOFOL;  Surgeon: Kathi Der, MD;  Location: [facility];  Service: Gastroenterology;  Laterality: N/A;   KNEE SURGERY Left    POLYPECTOMY  04/24/2023   Procedure: POLYPECTOMY;  Surgeon: Kathi Der, MD;   Location: [facility];  Service: Gastroenterology;;   RADIOLOGY WITH ANESTHESIA N/A 08/26/2014   Procedure: MRI LUMBAR SPINE   (RADIOLOGY WITH ANESTHESIA);  Surgeon: Medication Radiologist, MD;  Location: [facility];  Service: Radiology;  Laterality: N/A;   FAMILY HISTORY Family History  Problem Relation Age of Onset   Hypertension Mother    Diabetes type II Mother    Thyroid disease Mother    SOCIAL HISTORY Social History   Tobacco Use   Smoking status: Never   Smokeless tobacco: Never  Vaping Use   Vaping status: Never Used  Substance Use Topics   Alcohol use: No   Drug use: No       OPHTHALMIC EXAM:   Base Eye Exam     Visual Acuity (Snellen - Linear)       Right Left   Dist cc 20/60 -1 20/100 -2   Dist ph cc 20/50 -2 20/80 -2    Correction: Glasses         Tonometry (Tonopen, 9:18 AM)       Right Left   Pressure 17 21         Pupils       Dark Light Shape React APD   Right 3 2 Round Brisk None   Left 3 2 Round Brisk None         Visual Fields (Counting fingers)       Left Right    Full          Extraocular Movement       Right Left    Full, Ortho Full, Ortho         Neuro/Psych     Oriented x3: Yes   Mood/Affect: Normal         Dilation     Both eyes: 1.0% Mydriacyl, 2.5% Phenylephrine @ 9:18 AM           Slit Lamp and Fundus Exam     Slit Lamp Exam       Right Left   Lids/Lashes Dermatochalasis - upper lid Dermatochalasis - upper lid   Conjunctiva/Sclera Nasal and temporal Pinguecula, mild melanosis Nasal and temporal Pinguecula   Cornea Arcus, 1-2+ fine PEE Arcus, 2+Punctate epithelial erosions   Anterior Chamber Moderate depth, clear Moderate depth, clear   Iris Round and dilated, No NVI Round and dilated, No NVI   Lens 2+ Nuclear sclerosis, 2-3+ Cortical cataract, Vacuoles, trace Posterior subcapsular cataract 2-3+ Nuclear sclerosis with early brunescnece, 2-3+ Cortical cataract, 2-3+Posterior subcapsular cataract    Anterior Vitreous Vitreous syneresis Vitreous syneresis         Fundus Exam       Right Left   Disc Pink and Sharp, +cupping Pink and Sharp, +PPP, Thin superior rim, +cupping   C/D Ratio 0.6 0.75   Macula Good foveal reflex, central cystic changes, scattered MA / DBH Flat, Good foveal reflex, rare MA, no edema  Vessels attenuated, mild tortuosity attenuated, mild tortuosity   Periphery Attached, scattered MA / DBH greatest posteriorly Attached, no heme           Refraction     Wearing Rx       Sphere Cylinder Axis Add   Right [place] +0.50 093 +2.50   Left -0.25 Sphere  +2.50    Type: PAL           IMAGING AND PROCEDURES  Imaging and Procedures for @TODAY @  OCT, Retina - OU - Both Eyes       Right Eye Quality was borderline. Central Foveal Thickness: 300. Progression has been stable. Findings include normal foveal contour, no SRF, intraretinal fluid, vitreomacular adhesion (Persistent central cystic changes / DME).   Left Eye Quality was borderline. Central Foveal Thickness: 264. Progression has been stable. Findings include normal foveal contour, no IRF, no SRF.   Notes *Images captured and stored on drive  Diagnosis / Impression:  OD: persistent central cystic changes / DME OS: no DME  Clinical management:  See below  Abbreviations: NFP - Normal foveal profile. CME - cystoid macular edema. PED - pigment epithelial detachment. IRF - intraretinal fluid. SRF - subretinal fluid. EZ - ellipsoid zone. ERM - epiretinal membrane. ORA - outer retinal atrophy. ORT - outer retinal tubulation. SRHM - subretinal hyper-reflective material       Intravitreal Injection, Pharmacologic Agent - OD - Right Eye       Time Out 05/30/2023. 10:42 AM. Confirmed correct patient, procedure, site, and patient consented.   Anesthesia Topical anesthesia was used. Anesthetic medications included Lidocaine 2%, Proparacaine 0.5%.   Procedure Preparation included 5% betadine  to ocular surface, eyelid speculum. A supplied needle was used.   Injection: 1.25 mg Bevacizumab 1.25mg /0.101ml   Route: Intravitreal, Site: Right Eye   NDC: [id number], Lot: [id number], Expiration date: 07/06/2023   Post-op Post injection exam found visual acuity of at least counting fingers. The patient tolerated the procedure well. There were no complications. The patient received written and verbal post procedure care education.            ASSESSMENT/PLAN:    ICD-10-CM   1. Moderate nonproliferative diabetic retinopathy of both eyes with macular edema associated with type 2 diabetes mellitus (HCC)  E11.3313 OCT, Retina - OU - Both Eyes    Intravitreal Injection, Pharmacologic Agent - OD - Right Eye    Bevacizumab (AVASTIN) SOLN 1.25 mg    2. Current use of insulin (HCC)  Z79.4     3. Long term (current) use of oral hypoglycemic drugs  Z79.84     4. Primary open angle glaucoma of both eyes, unspecified glaucoma stage  H40.1130     5. Bilateral ocular hypertension  H40.053     6. Combined forms of age-related cataract of both eyes  H25.813      1-3. 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  - OS without DME  - last A1c was 8.4 on 01.20.25  - pt lost to follow up from May 2019-December 2022 and then from December 2022-January 2025 - OCT shows OD persistent central cystic changes / DME, OS without IRF/SRF - BCVA OD 20/50 from 20/70; OS stable at 20/80 - discussed findings, prognosis, and treatment options - recommend IVA OD #1 today, 02.27.25 for persistent DME - pt wishes to proceed with injection - RBA of procedure discussed, questions answered - IVA informed consent obtained and signed, 02.27.25 - see procedure note  - f/u in  4 wks -- DFE/OCT, possible injxn  4,5. POAG / Ocular hypertension OU  - IOP 17 OD and 21 OS today - +cupping w/ rim thinning OU - denies family hx of glaucoma - saw Dr. [name] last week and was put on timolol, but has not started  it yet  6. Combined form age-related cataract OU-  - The symptoms of cataract, surgical options, and treatments and risks were discussed with patient. - discussed diagnosis and progression - under the expert management of Dr. [name] - left eye scheduled for surgery March 13th with Dr. [name]  Ophthalmic Meds Ordered this visit:  Meds ordered this encounter  Medications   Bevacizumab (AVASTIN) SOLN 1.25 mg     Return in about 4 weeks (around 06/27/2023) for f/u NPDR OU, DFE, OCT, Possible Injxn.  There are no Patient Instructions on file for this visit.   Explained the diagnoses, plan, and follow up with the patient and they expressed understanding.  Patient expressed understanding of the importance of proper follow up care.   This document serves as a record of services personally performed by Karie Chimera, MD, PhD. It was created on their behalf by Annalee Genta, COMT. The creation of this record is the provider's dictation and/or activities during the visit.  Electronically signed by: Annalee Genta, COMT 05/30/23 4:38 PM  This document serves as a record of services personally performed by Karie Chimera, MD, PhD. It was created on their behalf by Glee Arvin. Manson Passey, OA an ophthalmic technician. The creation of this record is the provider's dictation and/or activities during the visit.    Electronically signed by: Glee Arvin. Manson Passey, OA 05/30/23 4:38 PM  Karie Chimera, M.D., Ph.D. Diseases & Surgery of the Retina and Vitreous [facility]  I have reviewed the above documentation for accuracy and completeness, and I agree with the above. Karie Chimera, M.D., Ph.D. 05/30/23 4:39 PM   Abbreviations: M myopia (nearsighted); A astigmatism; H hyperopia (farsighted); P presbyopia; Mrx spectacle prescription;  CTL contact lenses; OD right eye; OS left eye; OU both eyes  XT exotropia; ET esotropia; PEK punctate epithelial keratitis; PEE punctate epithelial erosions; DES  dry eye syndrome; MGD meibomian gland dysfunction; ATs artificial tears; PFAT's preservative free artificial tears; NSC nuclear sclerotic cataract; PSC posterior subcapsular cataract; ERM epi-retinal membrane; PVD posterior vitreous detachment; RD retinal detachment; DM diabetes mellitus; DR diabetic retinopathy; NPDR non-proliferative diabetic retinopathy; PDR proliferative diabetic retinopathy; CSME clinically significant macular edema; DME diabetic macular edema; dbh dot blot hemorrhages; CWS cotton wool spot; POAG primary open angle glaucoma; C/D cup-to-disc ratio; HVF humphrey visual field; GVF goldmann visual field; OCT optical coherence tomography; IOP intraocular pressure; BRVO Branch retinal vein occlusion; CRVO central retinal vein occlusion; CRAO central retinal artery occlusion; BRAO branch retinal artery occlusion; RT retinal tear; SB scleral buckle; PPV pars plana vitrectomy; VH Vitreous hemorrhage; PRP panretinal laser photocoagulation; IVK intravitreal kenalog; VMT vitreomacular traction; MH Macular hole;  NVD neovascularization of the disc; NVE neovascularization elsewhere; AREDS age related eye disease study; ARMD age related macular degeneration; POAG primary open angle glaucoma; EBMD epithelial/anterior basement membrane dystrophy; ACIOL anterior chamber intraocular lens; IOL intraocular lens; PCIOL posterior chamber intraocular lens; Phaco/IOL phacoemulsification with intraocular lens placement; PRK photorefractive keratectomy; LASIK laser assisted in situ keratomileusis; HTN hypertension; DM diabetes mellitus; COPD chronic obstructive pulmonary disease

## 2023-05-30 ENCOUNTER — Ambulatory Visit (INDEPENDENT_AMBULATORY_CARE_PROVIDER_SITE_OTHER): Payer: Medicare Other | Admitting: Ophthalmology

## 2023-05-30 ENCOUNTER — Encounter (INDEPENDENT_AMBULATORY_CARE_PROVIDER_SITE_OTHER): Payer: Self-pay | Admitting: Ophthalmology

## 2023-05-30 DIAGNOSIS — H401130 Primary open-angle glaucoma, bilateral, stage unspecified: Secondary | ICD-10-CM

## 2023-05-30 DIAGNOSIS — Z794 Long term (current) use of insulin: Secondary | ICD-10-CM

## 2023-05-30 DIAGNOSIS — E113313 Type 2 diabetes mellitus with moderate nonproliferative diabetic retinopathy with macular edema, bilateral: Secondary | ICD-10-CM

## 2023-05-30 DIAGNOSIS — H25813 Combined forms of age-related cataract, bilateral: Secondary | ICD-10-CM

## 2023-05-30 DIAGNOSIS — Z7984 Long term (current) use of oral hypoglycemic drugs: Secondary | ICD-10-CM

## 2023-05-30 DIAGNOSIS — H40053 Ocular hypertension, bilateral: Secondary | ICD-10-CM

## 2023-05-30 MED ORDER — BEVACIZUMAB CHEMO INJECTION 1.25MG/0.05ML SYRINGE FOR KALEIDOSCOPE
1.2500 mg | INTRAVITREAL | Status: AC | PRN
  Administered 2023-05-30: 1.25 mg via INTRAVITREAL

## 2023-05-31 ENCOUNTER — Encounter (INDEPENDENT_AMBULATORY_CARE_PROVIDER_SITE_OTHER): Payer: Medicare Other | Admitting: Ophthalmology

## 2023-06-26 NOTE — Progress Notes (Signed)
 [facility] - Clinic Note  06/27/2023     CHIEF COMPLAINT Patient presents for Retina Follow Up    HISTORY OF PRESENT ILLNESS: Raymond Munoz. is a 73 y.o. male who presents to the clinic today for:   HPI     Retina Follow Up   Patient presents with  Diabetic Retinopathy.  In both eyes.  This started 4 weeks ago.  I, the attending physician,  performed the HPI with the patient and updated documentation appropriately.        Comments   Patient here for 4 weeks retina follow up for NPDR OU. Patient states vision doing good. Had cataract surgery OS. Scheduled for cataract surgery OD April 22 nd. Using combo drop of pred/mox/brom BID OS. Dorzolamid/timolol BID OU.      Last edited by [name], MD on 06/27/2023  9:48 AM.    Pt had cataract sx OS with Dr. Zenaida Munoz on March 13th, he states his vision has improved since the sx, right eye is scheduled for April 22nd, he states everything went okay with his first injection last month,   Referring physician: [name], MD [address] [place],  [place] [address]  HISTORICAL INFORMATION:   Selected notes from the MEDICAL RECORD NUMBER Referred by Dr. Mirna Munoz for DM exam LEE:  Ocular Hx- PMH-DM (on Janumet, Lantus and Metformin), HTN, arthritis    CURRENT MEDICATIONS: No current outpatient medications on file. (Ophthalmic Drugs)   No current facility-administered medications for this visit. (Ophthalmic Drugs)   Current Outpatient Medications (Other)  Medication Sig   albuterol (VENTOLIN HFA) 108 (90 Base) MCG/ACT inhaler Inhale 1-2 puffs into the lungs every 6 (six) hours as needed for wheezing or shortness of breath.   aspirin 81 MG chewable tablet Chew 81 mg by mouth daily.   fexofenadine (ALLEGRA) 180 MG tablet Take 180 mg by mouth daily.   gabapentin (NEURONTIN) 300 MG capsule Take 300-600 mg by mouth 2 (two) times daily. 300mg  in the morning and 600mg  at bedtime   gemfibrozil (LOPID) 600  MG tablet Take 600 mg by mouth 2 (two) times daily before a meal.    glucose blood (FREESTYLE LITE) test strip Use as instructed   hydrochlorothiazide (HYDRODIURIL) 25 MG tablet Take 25 mg by mouth daily.    insulin glargine (LANTUS SOLOSTAR) 100 UNIT/ML Solostar Pen Inject 40 Units into the skin at bedtime.   Lancets (FREESTYLE) lancets Use as instructed   metFORMIN (GLUCOPHAGE) 1000 MG tablet Take 1 tablet (1,000 mg total) by mouth daily with breakfast.   Omega 3 1200 MG CAPS Take 2,400 mg by mouth 2 (two) times daily.   omeprazole (PRILOSEC OTC) 20 MG tablet Take 2 tablets (40 mg total) by mouth daily.   predniSONE (DELTASONE) 10 MG tablet Take 4 tablets (40 mg total) by mouth daily.   vitamin C (ASCORBIC ACID) 500 MG tablet Take 500 mg by mouth daily.   No current facility-administered medications for this visit. (Other)   REVIEW OF SYSTEMS: ROS   Positive for: Endocrine, Eyes Negative for: Constitutional, Gastrointestinal, Neurological, Skin, Genitourinary, Musculoskeletal, HENT, Cardiovascular, Respiratory, Psychiatric, Allergic/Imm, Heme/Lymph Last edited by Laddie Aquas, COA on 06/27/2023  9:16 AM.     ALLERGIES Allergies  Allergen Reactions   Codeine     unknown   Ibuprofen     REACTION: unspecified   Aspirin Nausea Only    Irritates stomach-can take 81 mg   PAST MEDICAL  HISTORY Past Medical History:  Diagnosis Date   Anxiety    Arthritis    Diabetes mellitus without complication (HCC)    Type 2   GERD (gastroesophageal reflux disease)    Hypertension    Past Surgical History:  Procedure Laterality Date   BIOPSY  04/24/2023   Procedure: BIOPSY;  Surgeon: Raymond Der, MD;  Location: [facility];  Service: Gastroenterology;;   CATARACT EXTRACTION     COLONOSCOPY WITH PROPOFOL N/A 04/24/2023   Procedure: COLONOSCOPY WITH PROPOFOL;  Surgeon: Raymond Der, MD;  Location: [facility];  Service: Gastroenterology;  Laterality: N/A;    ESOPHAGOGASTRODUODENOSCOPY (EGD) WITH PROPOFOL N/A 04/24/2023   Procedure: ESOPHAGOGASTRODUODENOSCOPY (EGD) WITH PROPOFOL;  Surgeon: Raymond Der, MD;  Location: [facility];  Service: Gastroenterology;  Laterality: N/A;   KNEE SURGERY Left    POLYPECTOMY  04/24/2023   Procedure: POLYPECTOMY;  Surgeon: Raymond Der, MD;  Location: [facility];  Service: Gastroenterology;;   RADIOLOGY WITH ANESTHESIA N/A 08/26/2014   Procedure: MRI LUMBAR SPINE   (RADIOLOGY WITH ANESTHESIA);  Surgeon: Medication Radiologist, MD;  Location: [facility];  Service: Radiology;  Laterality: N/A;   FAMILY HISTORY Family History  Problem Relation Age of Onset   Hypertension Mother    Diabetes type II Mother    Thyroid disease Mother    SOCIAL HISTORY Social History   Tobacco Use   Smoking status: Never   Smokeless tobacco: Never  Vaping Use   Vaping status: Never Used  Substance Use Topics   Alcohol use: No   Drug use: No       OPHTHALMIC EXAM:   Base Eye Exam     Visual Acuity (Snellen - Linear)       Right Left   Dist cc 20/70 +1 20/30   Dist ph cc 20/60 -1 20/25 -2    Correction: Glasses         Tonometry (Tonopen, 9:14 AM)       Right Left   Pressure 21 22         Pupils       Dark Light Shape React APD   Right 3 2 Round Brisk None   Left 3 2 Round Brisk None         Visual Fields (Counting fingers)       Left Right    Full Full         Extraocular Movement       Right Left    Full, Ortho Full, Ortho         Neuro/Psych     Oriented x3: Yes   Mood/Affect: Normal         Dilation     Both eyes: 1.0% Mydriacyl, 2.5% Phenylephrine @ 9:13 AM           Slit Lamp and Fundus Exam     Slit Lamp Exam       Right Left   Lids/Lashes Dermatochalasis - upper lid Dermatochalasis - upper lid   Conjunctiva/Sclera Nasal and temporal Pinguecula, mild melanosis Nasal and temporal Pinguecula, Melanosis   Cornea Arcus, tear film debris Arcus,  2-3+central Punctate epithelial erosions, well healed cataract wound   Anterior Chamber Moderate depth, clear deep and clear   Iris Round and dilated, No NVI Round and dilated, No NVI   Lens 2+ Nuclear sclerosis, 2-3+ Cortical cataract, +Vacuoles, trace Posterior subcapsular cataract PC IOL in good position   Anterior Vitreous Vitreous syneresis Vitreous syneresis  Fundus Exam       Right Left   Disc Pink and Sharp, +cupping Pink and Sharp, +PPP, Thin superior rim, +cupping   C/D Ratio 0.6 0.75   Macula Good foveal reflex, central cystic changes, scattered MA / DBH -- slightly improved Flat, Good foveal reflex, rare MA, no edema   Vessels attenuated, mild tortuosity attenuated, mild tortuosity   Periphery Attached, scattered MA / DBH greatest posteriorly Attached, no heme           Refraction     Wearing Rx       Sphere Cylinder Axis Add   Right [place] +0.50 093 +2.50   Left -0.25 Sphere  +2.50    Type: PAL           IMAGING AND PROCEDURES  Imaging and Procedures for @TODAY @  OCT, Retina - OU - Both Eyes       Right Eye Quality was borderline. Central Foveal Thickness: 325. Progression has been stable. Findings include normal foveal contour, no SRF, intraretinal fluid, vitreomacular adhesion (Persistent central cystic changes / DME -- slightly increased).   Left Eye Quality was good. Central Foveal Thickness: 264. Progression has been stable. Findings include normal foveal contour, no IRF, no SRF.   Notes *Images captured and stored on drive  Diagnosis / Impression:  OD: persistent central cystic changes / DME -- slightly increased OS: no DME  Clinical management:  See below  Abbreviations: NFP - Normal foveal profile. CME - cystoid macular edema. PED - pigment epithelial detachment. IRF - intraretinal fluid. SRF - subretinal fluid. EZ - ellipsoid zone. ERM - epiretinal membrane. ORA - outer retinal atrophy. ORT - outer retinal tubulation. SRHM -  subretinal hyper-reflective material       Intravitreal Injection, Pharmacologic Agent - OD - Right Eye       Time Out 06/27/2023. 9:35 AM. Confirmed correct patient, procedure, site, and patient consented.   Anesthesia Topical anesthesia was used. Anesthetic medications included Lidocaine 2%, Proparacaine 0.5%.   Procedure Preparation included 5% betadine to ocular surface, eyelid speculum. A (32g) needle was used.   Injection: 1.25 mg Bevacizumab 1.25mg /0.24ml   Route: Intravitreal, Site: Right Eye   NDC: [id number], Lot: [id number], Expiration date: 09/29/2023   Post-op Post injection exam found visual acuity of at least counting fingers. The patient tolerated the procedure well. There were no complications. The patient received written and verbal post procedure care education.            ASSESSMENT/PLAN:    ICD-10-CM   1. Moderate nonproliferative diabetic retinopathy of both eyes with macular edema associated with type 2 diabetes mellitus (HCC)  E11.3313 OCT, Retina - OU - Both Eyes    Intravitreal Injection, Pharmacologic Agent - OD - Right Eye    Bevacizumab (AVASTIN) SOLN 1.25 mg    2. Current use of insulin (HCC)  Z79.4     3. Long term (current) use of oral hypoglycemic drugs  Z79.84     4. Primary open angle glaucoma of both eyes, unspecified glaucoma stage  H40.1130     5. Bilateral ocular hypertension  H40.053     6. Combined forms of age-related cataract of left eye  H25.812     7. Pseudophakia  Z96.1      1-3. 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  - s/p IVA OD #1 (02.27.25)  - OS without DME  - last A1c was 8.4 on 01.20.25  - pt lost to follow up from May  2019-December 2022 and then from December 2022-January 2025 - OCT shows OD persistent central cystic changes / DME--slightly increased, OS without IRF/SRF - BCVA OD 20/60 from 20/50; OS improved to 20/25 from 20/80 -- s/p CE IOL - discussed findings, prognosis, and treatment  options - recommend IVA OD #2 today, 03.27.25 for persistent DME - pt wishes to proceed with injection - RBA of procedure discussed, questions answered - IVA informed consent obtained and signed, 02.27.25 - see procedure note  - f/u in 5 wks -- DFE/OCT, possible injxn  4,5. POAG / Ocular hypertension OU  - IOP 21 OD and 22 OS today - +cupping w/ rim thinning OU - denies family hx of glaucoma - on timolol  6. Combined form age-related cataract OD - The symptoms of cataract, surgical options, and treatments and risks were discussed with patient. - discussed diagnosis and progression - under the expert management of Dr. Zenaida Munoz - scheduled for surgery April 22nd with Dr Raymond Munoz  7. Pseudophakia OS  - s/p CE/IOL OS (Dr. Van,03.13.25)  - IOL in good position, doing well  - post op drops per Dr. Zenaida Munoz  - monitor   Ophthalmic Meds Ordered this visit:  Meds ordered this encounter  Medications   Bevacizumab (AVASTIN) SOLN 1.25 mg     Return in about 5 weeks (around 08/01/2023) for f/u NPDR OU, DFE, OCT, Possible Injxn.  There are no Patient Instructions on file for this visit.   Explained the diagnoses, plan, and follow up with the patient and they expressed understanding.  Patient expressed understanding of the importance of proper follow up care.   This document serves as a record of services personally performed by Karie Chimera, MD, PhD. It was created on their behalf by Annalee Genta, COMT. The creation of this record is the provider's dictation and/or activities during the visit.  Electronically signed by: Annalee Genta, COMT 06/27/23 9:59 AM  This document serves as a record of services personally performed by Karie Chimera, MD, PhD. It was created on their behalf by Glee Arvin. Manson Passey, OA an ophthalmic technician. The creation of this record is the provider's dictation and/or activities during the visit.    Electronically signed by: Glee Arvin. Manson Passey, OA 06/27/23 9:59 AM  Karie Chimera, M.D., Ph.D. Diseases & Surgery of the Retina and Vitreous [facility]  I have reviewed the above documentation for accuracy and completeness, and I agree with the above. Karie Chimera, M.D., Ph.D. 06/27/23 10:01 AM   Abbreviations: M myopia (nearsighted); A astigmatism; H hyperopia (farsighted); P presbyopia; Mrx spectacle prescription;  CTL contact lenses; OD right eye; OS left eye; OU both eyes  XT exotropia; ET esotropia; PEK punctate epithelial keratitis; PEE punctate epithelial erosions; DES dry eye syndrome; MGD meibomian gland dysfunction; ATs artificial tears; PFAT's preservative free artificial tears; NSC nuclear sclerotic cataract; PSC posterior subcapsular cataract; ERM epi-retinal membrane; PVD posterior vitreous detachment; RD retinal detachment; DM diabetes mellitus; DR diabetic retinopathy; NPDR non-proliferative diabetic retinopathy; PDR proliferative diabetic retinopathy; CSME clinically significant macular edema; DME diabetic macular edema; dbh dot blot hemorrhages; CWS cotton wool spot; POAG primary open angle glaucoma; C/D cup-to-disc ratio; HVF humphrey visual field; GVF goldmann visual field; OCT optical coherence tomography; IOP intraocular pressure; BRVO Branch retinal vein occlusion; CRVO central retinal vein occlusion; CRAO central retinal artery occlusion; BRAO branch retinal artery occlusion; RT retinal tear; SB scleral buckle; PPV pars plana vitrectomy; VH Vitreous hemorrhage; PRP panretinal laser photocoagulation; IVK  intravitreal kenalog; VMT vitreomacular traction; MH Macular hole;  NVD neovascularization of the disc; NVE neovascularization elsewhere; AREDS age related eye disease study; ARMD age related macular degeneration; POAG primary open angle glaucoma; EBMD epithelial/anterior basement membrane dystrophy; ACIOL anterior chamber intraocular lens; IOL intraocular lens; PCIOL posterior chamber intraocular lens; Phaco/IOL phacoemulsification  with intraocular lens placement; PRK photorefractive keratectomy; LASIK laser assisted in situ keratomileusis; HTN hypertension; DM diabetes mellitus; COPD chronic obstructive pulmonary disease

## 2023-06-27 ENCOUNTER — Encounter (INDEPENDENT_AMBULATORY_CARE_PROVIDER_SITE_OTHER): Payer: Self-pay | Admitting: Ophthalmology

## 2023-06-27 ENCOUNTER — Ambulatory Visit (INDEPENDENT_AMBULATORY_CARE_PROVIDER_SITE_OTHER): Payer: Medicare Other | Admitting: Ophthalmology

## 2023-06-27 DIAGNOSIS — H25813 Combined forms of age-related cataract, bilateral: Secondary | ICD-10-CM

## 2023-06-27 DIAGNOSIS — Z794 Long term (current) use of insulin: Secondary | ICD-10-CM | POA: Diagnosis not present

## 2023-06-27 DIAGNOSIS — Z7984 Long term (current) use of oral hypoglycemic drugs: Secondary | ICD-10-CM | POA: Diagnosis not present

## 2023-06-27 DIAGNOSIS — H401130 Primary open-angle glaucoma, bilateral, stage unspecified: Secondary | ICD-10-CM | POA: Diagnosis not present

## 2023-06-27 DIAGNOSIS — E113313 Type 2 diabetes mellitus with moderate nonproliferative diabetic retinopathy with macular edema, bilateral: Secondary | ICD-10-CM

## 2023-06-27 DIAGNOSIS — H40053 Ocular hypertension, bilateral: Secondary | ICD-10-CM

## 2023-06-27 DIAGNOSIS — Z961 Presence of intraocular lens: Secondary | ICD-10-CM

## 2023-06-27 DIAGNOSIS — H25812 Combined forms of age-related cataract, left eye: Secondary | ICD-10-CM

## 2023-06-27 MED ORDER — BEVACIZUMAB CHEMO INJECTION 1.25MG/0.05ML SYRINGE FOR KALEIDOSCOPE
1.2500 mg | INTRAVITREAL | Status: AC | PRN
  Administered 2023-06-27: 1.25 mg via INTRAVITREAL

## 2023-07-30 ENCOUNTER — Ambulatory Visit (HOSPITAL_COMMUNITY)
Admission: RE | Admit: 2023-07-30 | Discharge: 2023-07-30 | Disposition: A | Discharge status: Home / Self Care | Source: Ambulatory Visit | Attending: Vascular Surgery | Admitting: Vascular Surgery

## 2023-07-30 ENCOUNTER — Other Ambulatory Visit (HOSPITAL_COMMUNITY): Payer: Self-pay | Admitting: Family Medicine

## 2023-07-30 DIAGNOSIS — R2243 Localized swelling, mass and lump, lower limb, bilateral: Secondary | ICD-10-CM

## 2023-07-31 NOTE — Progress Notes (Signed)
 [facility] - Clinic Note  08/01/2023     CHIEF COMPLAINT Patient presents for Retina Follow Up    HISTORY OF PRESENT ILLNESS: Raymond Munoz. is a 73 y.o. male who presents to the clinic today for:   HPI     Retina Follow Up   Patient presents with  Diabetic Retinopathy.  In both eyes.  This started 4 weeks ago.  I, the attending physician,  performed the HPI with the patient and updated documentation appropriately.        Comments   Patient feels the vision is doing well. He is using AT's, Cosopt OU BID, Ketorolac  OD QID, and Brimonidine OU BID. He had cataract sx OU with Dr. Carloyn Chi. His blood sugar was 78.      Last edited by Ronelle Coffee, MD on 08/01/2023  1:59 PM.     Referring physician: Wilburn Handler, MD [address] [place],  [place] [address]  HISTORICAL INFORMATION:   Selected notes from the MEDICAL RECORD NUMBER Referred by Dr. Wilburn Handler for DM exam LEE:  Ocular Hx- PMH-DM (on Janumet, Lantus  and Metformin ), HTN, arthritis    CURRENT MEDICATIONS: No current outpatient medications on file. (Ophthalmic Drugs)   No current facility-administered medications for this visit. (Ophthalmic Drugs)   Current Outpatient Medications (Other)  Medication Sig   albuterol  (VENTOLIN  HFA) 108 (90 Base) MCG/ACT inhaler Inhale 1-2 puffs into the lungs every 6 (six) hours as needed for wheezing or shortness of breath.   aspirin 81 MG chewable tablet Chew 81 mg by mouth daily.   fexofenadine (ALLEGRA) 180 MG tablet Take 180 mg by mouth daily.   gabapentin  (NEURONTIN ) 300 MG capsule Take 300-600 mg by mouth 2 (two) times daily. 300mg  in the morning and 600mg  at bedtime   gemfibrozil  (LOPID ) 600 MG tablet Take 600 mg by mouth 2 (two) times daily before a meal.    glucose blood (FREESTYLE LITE) test strip Use as instructed   hydrochlorothiazide  (HYDRODIURIL ) 25 MG tablet Take 25 mg by mouth daily.    insulin  glargine (LANTUS  SOLOSTAR) 100 UNIT/ML  Solostar Pen Inject 40 Units into the skin at bedtime.   Lancets (FREESTYLE) lancets Use as instructed   metFORMIN  (GLUCOPHAGE ) 1000 MG tablet Take 1 tablet (1,000 mg total) by mouth daily with breakfast.   Omega 3 1200 MG CAPS Take 2,400 mg by mouth 2 (two) times daily.   omeprazole  (PRILOSEC  OTC) 20 MG tablet Take 2 tablets (40 mg total) by mouth daily.   predniSONE  (DELTASONE ) 10 MG tablet Take 4 tablets (40 mg total) by mouth daily.   vitamin C (ASCORBIC ACID) 500 MG tablet Take 500 mg by mouth daily.   No current facility-administered medications for this visit. (Other)   REVIEW OF SYSTEMS: ROS   Positive for: Endocrine, Eyes Negative for: Constitutional, Gastrointestinal, Neurological, Skin, Genitourinary, Musculoskeletal, HENT, Cardiovascular, Respiratory, Psychiatric, Allergic/Imm, Heme/Lymph Last edited by Olene Berne, COT on 08/01/2023  8:07 AM.     ALLERGIES Allergies  Allergen Reactions   Codeine     unknown   Ibuprofen     REACTION: unspecified   Aspirin Nausea Only    Irritates stomach-can take 81 mg   PAST MEDICAL HISTORY Past Medical History:  Diagnosis Date   Anxiety    Arthritis    Diabetes mellitus without complication (HCC)    Type 2   GERD (gastroesophageal reflux disease)    Hypertension    Past Surgical History:  Procedure Laterality Date   BIOPSY  04/24/2023   Procedure: BIOPSY;  Surgeon: Felecia Hopper, MD;  Location: [facility];  Service: Gastroenterology;;   CATARACT EXTRACTION     COLONOSCOPY WITH PROPOFOL  N/A 04/24/2023   Procedure: COLONOSCOPY WITH PROPOFOL ;  Surgeon: Felecia Hopper, MD;  Location: [facility];  Service: Gastroenterology;  Laterality: N/A;   ESOPHAGOGASTRODUODENOSCOPY (EGD) WITH PROPOFOL  N/A 04/24/2023   Procedure: ESOPHAGOGASTRODUODENOSCOPY (EGD) WITH PROPOFOL ;  Surgeon: Felecia Hopper, MD;  Location: [facility];  Service: Gastroenterology;  Laterality: N/A;   KNEE SURGERY Left    POLYPECTOMY   04/24/2023   Procedure: POLYPECTOMY;  Surgeon: Felecia Hopper, MD;  Location: [facility];  Service: Gastroenterology;;   RADIOLOGY WITH ANESTHESIA N/A 08/26/2014   Procedure: MRI LUMBAR SPINE   (RADIOLOGY WITH ANESTHESIA);  Surgeon: Medication Radiologist, MD;  Location: [facility];  Service: Radiology;  Laterality: N/A;   FAMILY HISTORY Family History  Problem Relation Age of Onset   Hypertension Mother    Diabetes type II Mother    Thyroid  disease Mother    SOCIAL HISTORY Social History   Tobacco Use   Smoking status: Never   Smokeless tobacco: Never  Vaping Use   Vaping status: Never Used  Substance Use Topics   Alcohol use: No   Drug use: No       OPHTHALMIC EXAM:   Base Eye Exam     Visual Acuity (Snellen - Linear)       Right Left   Dist [place] 20/20 +2 20/20 +1         Tonometry (Tonopen, 8:12 AM)       Right Left   Pressure 13 11         Pupils       Dark Light Shape React APD   Right 3 2 Round Brisk None   Left 3 2 Round Brisk None         Visual Fields       Left Right    Full Full         Extraocular Movement       Right Left    Full, Ortho Full, Ortho         Neuro/Psych     Oriented x3: Yes   Mood/Affect: Normal         Dilation     Both eyes: 1.0% Mydriacyl, 2.5% Phenylephrine @ 8:07 AM           Slit Lamp and Fundus Exam     Slit Lamp Exam       Right Left   Lids/Lashes Dermatochalasis - upper lid Dermatochalasis - upper lid   Conjunctiva/Sclera Nasal and temporal Pinguecula, mild melanosis Nasal and temporal Pinguecula, Melanosis   Cornea Arcus, tear film debris, mild EBMD superiorly, well healed cataract wound Arcus, well healed cataract wound   Anterior Chamber Deep,  deep and clear   Iris Round and dilated, No NVI Round and dilated, No NVI   Lens PC IOL in good position PC IOL in good position   Anterior Vitreous Vitreous syneresis Vitreous syneresis         Fundus Exam       Right Left   Disc  Pink and Sharp, +cupping Mild pallor, sharp rim, +PPP, Thin superior rim, +cupping   C/D Ratio 0.6 0.75   Macula Good foveal reflex, central cystic changes, scattered MA / DBH -- slightly improved Flat, Good foveal reflex, rare MA / DBH, no edema   Vessels attenuated, mild  tortuosity attenuated, mild tortuosity   Periphery Attached, scattered MA / DBH greatest posteriorly Attached, no heme           Refraction     Wearing Rx       Sphere Cylinder Axis Add   Right [place] +0.50 093 +2.50   Left -0.25 Sphere  +2.50    Type: PAL           IMAGING AND PROCEDURES  Imaging and Procedures for @TODAY @  OCT, Retina - OU - Both Eyes       Right Eye Quality was good. Central Foveal Thickness: 316. Progression has improved. Findings include normal foveal contour, no SRF, intraretinal fluid, vitreomacular adhesion (Persistent central cystic changes / DME -- slightly improved).   Left Eye Quality was good. Progression has been stable. Findings include normal foveal contour, no IRF, no SRF (Partial PVD).   Notes *Images captured and stored on drive  Diagnosis / Impression:  OD: persistent central cystic changes / DME -- slightly improved OS: no DME, partial PVD  Clinical management:  See below  Abbreviations: NFP - Normal foveal profile. CME - cystoid macular edema. PED - pigment epithelial detachment. IRF - intraretinal fluid. SRF - subretinal fluid. EZ - ellipsoid zone. ERM - epiretinal membrane. ORA - outer retinal atrophy. ORT - outer retinal tubulation. SRHM - subretinal hyper-reflective material       Intravitreal Injection, Pharmacologic Agent - OD - Right Eye       Time Out 08/01/2023. 9:01 AM. Confirmed correct patient, procedure, site, and patient consented.   Anesthesia Topical anesthesia was used. Anesthetic medications included Lidocaine  2%, Proparacaine 0.5%.   Procedure Preparation included 5% betadine to ocular surface, eyelid speculum. A supplied (32g)  needle was used.   Injection: 1.25 mg Bevacizumab  1.25mg /0.17ml   Route: Intravitreal, Site: Right Eye   NDC: [id number], Lot: [id number], Expiration date: 08/29/2023   Post-op Post injection exam found visual acuity of at least counting fingers. The patient tolerated the procedure well. There were no complications. The patient received written and verbal post procedure care education.            ASSESSMENT/PLAN:    ICD-10-CM   1. Moderate nonproliferative diabetic retinopathy of both eyes with macular edema associated with type 2 diabetes mellitus (HCC)  E11.3313 OCT, Retina - OU - Both Eyes    Intravitreal Injection, Pharmacologic Agent - OD - Right Eye    Bevacizumab  (AVASTIN ) SOLN 1.25 mg    2. Current use of insulin  (HCC)  Z79.4     3. Long term (current) use of oral hypoglycemic drugs  Z79.84     4. Primary open angle glaucoma of both eyes, unspecified glaucoma stage  H40.1130     5. Bilateral ocular hypertension  H40.053     6. Pseudophakia, both eyes  Z96.1      1-3. 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  - s/p IVA OD #1 (02.27.25), #2 (03.27.25)  - OS without DME  - last A1c was 8.4 on 01.20.25  - pt lost to follow up from May 2019-December 2022 and then from December 2022-January 2025 - OCT shows OD persistent central cystic changes / DME--slightly improved, OS without IRF/SRF at 5 wks - BCVA OD 20/20 from 20/60; OS improved to 20/20 from 20/25 -- s/p CE IOL OU - discussed findings, prognosis, and treatment options - recommend IVA OD #3 today, 05.01.25 for persistent DME - pt wishes to proceed with injection - RBA of procedure discussed,  questions answered - IVA informed consent obtained and signed, 02.27.25 - see procedure note  - f/u in 5 wks -- DFE/OCT, possible injxn  4,5. POAG / Ocular hypertension OU  - IOP 13 OD and 11 OS today - +cupping w/ rim thinning OU - denies family hx of glaucoma - on timolol  6. Pseudophakia OU  - s/p  CE/IOL OS (Dr. Carloyn Chi, OS: 03.13.25; OD: 04.22.25)  - IOL in good position, doing well  - post op drops per Dr. Carloyn Chi  - monitor   Ophthalmic Meds Ordered this visit:  Meds ordered this encounter  Medications   Bevacizumab  (AVASTIN ) SOLN 1.25 mg     Return in about 5 weeks (around 09/05/2023) for f/u NPDR OU, DFE, OCT, Possible Injxn.  There are no Patient Instructions on file for this visit.   Explained the diagnoses, plan, and follow up with the patient and they expressed understanding.  Patient expressed understanding of the importance of proper follow up care.   This document serves as a record of services personally performed by Jeanice Millard, MD, PhD. It was created on their behalf by Diona Franklin, COMT. The creation of this record is the provider's dictation and/or activities during the visit.  Electronically signed by: Diona Franklin, COMT 08/01/23 2:08 PM  This document serves as a record of services personally performed by Jeanice Millard, MD, PhD. It was created on their behalf by Morley Arabia. Bevin Bucks, OA an ophthalmic technician. The creation of this record is the provider's dictation and/or activities during the visit.    Electronically signed by: Morley Arabia. Bevin Bucks, OA 08/01/23 2:08 PM  Jeanice Millard, M.D., Ph.D. Diseases & Surgery of the Retina and Vitreous [facility]  I have reviewed the above documentation for accuracy and completeness, and I agree with the above. Jeanice Millard, M.D., Ph.D. 08/01/23 2:10 PM   Abbreviations: M myopia (nearsighted); A astigmatism; H hyperopia (farsighted); P presbyopia; Mrx spectacle prescription;  CTL contact lenses; OD right eye; OS left eye; OU both eyes  XT exotropia; ET esotropia; PEK punctate epithelial keratitis; PEE punctate epithelial erosions; DES dry eye syndrome; MGD meibomian gland dysfunction; ATs artificial tears; PFAT's preservative free artificial tears; NSC nuclear sclerotic cataract; PSC posterior  subcapsular cataract; ERM epi-retinal membrane; PVD posterior vitreous detachment; RD retinal detachment; DM diabetes mellitus; DR diabetic retinopathy; NPDR non-proliferative diabetic retinopathy; PDR proliferative diabetic retinopathy; CSME clinically significant macular edema; DME diabetic macular edema; dbh dot blot hemorrhages; CWS cotton wool spot; POAG primary open angle glaucoma; C/D cup-to-disc ratio; HVF humphrey visual field; GVF goldmann visual field; OCT optical coherence tomography; IOP intraocular pressure; BRVO Branch retinal vein occlusion; CRVO central retinal vein occlusion; CRAO central retinal artery occlusion; BRAO branch retinal artery occlusion; RT retinal tear; SB scleral buckle; PPV pars plana vitrectomy; VH Vitreous hemorrhage; PRP panretinal laser photocoagulation; IVK intravitreal kenalog; VMT vitreomacular traction; MH Macular hole;  NVD neovascularization of the disc; NVE neovascularization elsewhere; AREDS age related eye disease study; ARMD age related macular degeneration; POAG primary open angle glaucoma; EBMD epithelial/anterior basement membrane dystrophy; ACIOL anterior chamber intraocular lens; IOL intraocular lens; PCIOL posterior chamber intraocular lens; Phaco/IOL phacoemulsification with intraocular lens placement; PRK photorefractive keratectomy; LASIK laser assisted in situ keratomileusis; HTN hypertension; DM diabetes mellitus; COPD chronic obstructive pulmonary disease

## 2023-08-01 ENCOUNTER — Encounter (INDEPENDENT_AMBULATORY_CARE_PROVIDER_SITE_OTHER): Payer: Self-pay | Admitting: Ophthalmology

## 2023-08-01 ENCOUNTER — Ambulatory Visit (INDEPENDENT_AMBULATORY_CARE_PROVIDER_SITE_OTHER): Admitting: Ophthalmology

## 2023-08-01 DIAGNOSIS — E113313 Type 2 diabetes mellitus with moderate nonproliferative diabetic retinopathy with macular edema, bilateral: Secondary | ICD-10-CM | POA: Diagnosis not present

## 2023-08-01 DIAGNOSIS — Z961 Presence of intraocular lens: Secondary | ICD-10-CM

## 2023-08-01 DIAGNOSIS — Z7984 Long term (current) use of oral hypoglycemic drugs: Secondary | ICD-10-CM

## 2023-08-01 DIAGNOSIS — H40053 Ocular hypertension, bilateral: Secondary | ICD-10-CM

## 2023-08-01 DIAGNOSIS — H401130 Primary open-angle glaucoma, bilateral, stage unspecified: Secondary | ICD-10-CM

## 2023-08-01 DIAGNOSIS — H25812 Combined forms of age-related cataract, left eye: Secondary | ICD-10-CM

## 2023-08-01 DIAGNOSIS — Z794 Long term (current) use of insulin: Secondary | ICD-10-CM

## 2023-08-01 MED ORDER — BEVACIZUMAB CHEMO INJECTION 1.25MG/0.05ML SYRINGE FOR KALEIDOSCOPE
1.2500 mg | INTRAVITREAL | Status: AC | PRN
  Administered 2023-08-01: 1.25 mg via INTRAVITREAL

## 2023-08-28 NOTE — Progress Notes (Signed)
 [facility] - Clinic Note  09/05/2023     CHIEF COMPLAINT Patient presents for Retina Follow Up    HISTORY OF PRESENT ILLNESS: Raymond Munoz. is a 73 y.o. male who presents to the clinic today for:   HPI     Retina Follow Up   Patient presents with  Diabetic Retinopathy.  In both eyes.  Severity is moderate.  Duration of 4 weeks.  Since onset it is gradually improving.  I, the attending physician,  performed the HPI with the patient and updated documentation appropriately.        Comments   Pt here for 4 wk ret f/u NPDR OU. Pt states VA is good, improving. Pt reports taking a blue cap gtt BID OU, purple cap gtt BID OU, grey cap at bedtime OU.       Last edited by Ronelle Coffee, MD on 09/05/2023 12:40 PM.    Pt thinks his vision is doing well, he states his blood sugar / blood pressure was elevated a couple times over the past few weeks  Referring physician: Wilburn Handler, MD [address] [place],  [place] [address]  HISTORICAL INFORMATION:   Selected notes from the MEDICAL RECORD NUMBER Referred by Dr. Wilburn Handler for DM exam LEE:  Ocular Hx- PMH-DM (on Janumet, Lantus  and Metformin ), HTN, arthritis    CURRENT MEDICATIONS: No current outpatient medications on file. (Ophthalmic Drugs)   No current facility-administered medications for this visit. (Ophthalmic Drugs)   Current Outpatient Medications (Other)  Medication Sig   ACETAMINOPHEN  PO Take 650 mg by mouth as needed.   albuterol  (VENTOLIN  HFA) 108 (90 Base) MCG/ACT inhaler Inhale 1-2 puffs into the lungs every 6 (six) hours as needed for wheezing or shortness of breath.   amLODipine-benazepril (LOTREL) 5-40 MG capsule Take 1 capsule by mouth daily.   aspirin 81 MG chewable tablet Chew 81 mg by mouth daily.   atorvastatin (LIPITOR) 20 MG tablet Take 20 mg by mouth daily.   fexofenadine (ALLEGRA) 180 MG tablet Take 180 mg by mouth daily.   furosemide  (LASIX ) 20 MG tablet Take 20 mg by  mouth 3 (three) times daily.   gabapentin  (NEURONTIN ) 300 MG capsule Take 300-600 mg by mouth 2 (two) times daily. 300mg  in the morning and 600mg  at bedtime   gemfibrozil  (LOPID ) 600 MG tablet Take 600 mg by mouth 2 (two) times daily before a meal.    glucose blood (FREESTYLE LITE) test strip Use as instructed   hydrALAZINE  (APRESOLINE ) 25 MG tablet Take 25 mg by mouth at bedtime.   insulin  glargine (LANTUS  SOLOSTAR) 100 UNIT/ML Solostar Pen Inject 40 Units into the skin at bedtime.   Lancets (FREESTYLE) lancets Use as instructed   metFORMIN  (GLUCOPHAGE ) 1000 MG tablet Take 1 tablet (1,000 mg total) by mouth daily with breakfast.   Omega 3 1200 MG CAPS Take 2,400 mg by mouth 2 (two) times daily.   omeprazole  (PRILOSEC  OTC) 20 MG tablet Take 2 tablets (40 mg total) by mouth daily.   potassium chloride  SA (KLOR-CON  M) 20 MEQ tablet Take 20 mEq by mouth daily.   vitamin C (ASCORBIC ACID) 500 MG tablet Take 500 mg by mouth daily.   No current facility-administered medications for this visit. (Other)   REVIEW OF SYSTEMS: ROS   Positive for: Endocrine, Eyes Negative for: Constitutional, Gastrointestinal, Neurological, Skin, Genitourinary, Musculoskeletal, HENT, Cardiovascular, Respiratory, Psychiatric, Allergic/Imm, Heme/Lymph Last edited by Anthony Bateman, COT on 09/05/2023  8:05 AM.      ALLERGIES Allergies  Allergen Reactions   Codeine     unknown   Ibuprofen     REACTION: unspecified   Aspirin Nausea Only    Irritates stomach-can take 81 mg   PAST MEDICAL HISTORY Past Medical History:  Diagnosis Date   Anxiety    Arthritis    Chest pain    Diabetes mellitus without complication (HCC)    Type 2   Dizziness    DM (diabetes mellitus) (HCC)    ED (erectile dysfunction)    Enlarged heart    GERD (gastroesophageal reflux disease)    HLD (hyperlipidemia)    Hypertension    Localized swelling of both lower legs    Pleural effusion    Shortness of breath    Tachycardia     Past Surgical History:  Procedure Laterality Date   BIOPSY  04/24/2023   Procedure: BIOPSY;  Surgeon: Felecia Hopper, MD;  Location: [facility];  Service: Gastroenterology;;   CATARACT EXTRACTION     COLONOSCOPY WITH PROPOFOL  N/A 04/24/2023   Procedure: COLONOSCOPY WITH PROPOFOL ;  Surgeon: Felecia Hopper, MD;  Location: [facility];  Service: Gastroenterology;  Laterality: N/A;   ESOPHAGOGASTRODUODENOSCOPY (EGD) WITH PROPOFOL  N/A 04/24/2023   Procedure: ESOPHAGOGASTRODUODENOSCOPY (EGD) WITH PROPOFOL ;  Surgeon: Felecia Hopper, MD;  Location: [facility];  Service: Gastroenterology;  Laterality: N/A;   KNEE SURGERY Left    POLYPECTOMY  04/24/2023   Procedure: POLYPECTOMY;  Surgeon: Felecia Hopper, MD;  Location: [facility];  Service: Gastroenterology;;   RADIOLOGY WITH ANESTHESIA N/A 08/26/2014   Procedure: MRI LUMBAR SPINE   (RADIOLOGY WITH ANESTHESIA);  Surgeon: Medication Radiologist, MD;  Location: [facility];  Service: Radiology;  Laterality: N/A;   FAMILY HISTORY Family History  Problem Relation Age of Onset   Hypertension Mother    Diabetes type II Mother    Thyroid  disease Mother    SOCIAL HISTORY Social History   Tobacco Use   Smoking status: Never   Smokeless tobacco: Never  Vaping Use   Vaping status: Never Used  Substance Use Topics   Alcohol use: No   Drug use: No       OPHTHALMIC EXAM:   Base Eye Exam     Visual Acuity (Snellen - Linear)       Right Left   Dist Athalia 20/20 -1 20/20 -2         Tonometry (Tonopen, 8:11 AM)       Right Left   Pressure 15 17         Pupils       Pupils Dark Light Shape React APD   Right PERRL 3 2 Round Brisk None   Left PERRL 3 2 Round Brisk None         Visual Fields (Counting fingers)       Left Right    Full Full         Extraocular Movement       Right Left    Full, Ortho Full, Ortho         Neuro/Psych     Oriented x3: Yes   Mood/Affect: Normal         Dilation      Both eyes: 1.0% Mydriacyl, 2.5% Phenylephrine @ 8:11 AM           Slit Lamp and Fundus Exam     Slit Lamp Exam       Right Left   Lids/Lashes Dermatochalasis - upper lid  Dermatochalasis - upper lid   Conjunctiva/Sclera Nasal and temporal Pinguecula, mild melanosis Nasal and temporal Pinguecula, Melanosis   Cornea Arcus, tear film debris, mild EBMD superiorly, well healed cataract wound Arcus, well healed cataract wound   Anterior Chamber Deep,  deep and clear   Iris Round and dilated, No NVI Round and dilated, No NVI   Lens PC IOL in good position PC IOL in good position   Anterior Vitreous Vitreous syneresis Vitreous syneresis         Fundus Exam       Right Left   Disc Pink and Sharp, +cupping Mild pallor, sharp rim, +PPP, Thin superior rim, +cupping   C/D Ratio 0.6 0.75   Macula Good foveal reflex, central cystic changes -- slightly increased, scattered MA / DBH -- slightly improved Flat, Good foveal reflex, rare MA / DBH, trace cystic changes   Vessels attenuated attenuated, mild tortuosity   Periphery Attached, scattered MA / DBH greatest posteriorly Attached, no heme           IMAGING AND PROCEDURES  Imaging and Procedures for @TODAY @  OCT, Retina - OU - Both Eyes       Right Eye Quality was good. Central Foveal Thickness: 337. Progression has been stable. Findings include normal foveal contour, no SRF, intraretinal fluid, vitreomacular adhesion (Persistent central cystic changes / DME).   Left Eye Quality was good. Progression has worsened. Findings include normal foveal contour, no IRF, no SRF (Interval increase in scattered cystic changes, Partial PVD).   Notes *Images captured and stored on drive  Diagnosis / Impression:  OD: persistent central cystic changes / DME  OS: Interval increase in scattered cystic changes, Partial PVD  Clinical management:  See below  Abbreviations: NFP - Normal foveal profile. CME - cystoid macular edema. PED - pigment  epithelial detachment. IRF - intraretinal fluid. SRF - subretinal fluid. EZ - ellipsoid zone. ERM - epiretinal membrane. ORA - outer retinal atrophy. ORT - outer retinal tubulation. SRHM - subretinal hyper-reflective material       Intravitreal Injection, Pharmacologic Agent - OD - Right Eye       Time Out 09/05/2023. 8:57 AM. Confirmed correct patient, procedure, site, and patient consented.   Anesthesia Topical anesthesia was used. Anesthetic medications included Lidocaine  2%, Proparacaine 0.5%.   Procedure Preparation included 5% betadine to ocular surface, eyelid speculum. A supplied (32g) needle was used.   Injection: 1.25 mg Bevacizumab  1.25mg /0.24ml   Route: Intravitreal, Site: Right Eye   NDC: [id number], Lot: [id number], Expiration date: 10/11/2023   Post-op Post injection exam found visual acuity of at least counting fingers. The patient tolerated the procedure well. There were no complications. The patient received written and verbal post procedure care education.            ASSESSMENT/PLAN:    ICD-10-CM   1. Moderate nonproliferative diabetic retinopathy of both eyes with macular edema associated with type 2 diabetes mellitus (HCC)  E11.3313 OCT, Retina - OU - Both Eyes    Intravitreal Injection, Pharmacologic Agent - OD - Right Eye    Bevacizumab  (AVASTIN ) SOLN 1.25 mg    2. Current use of insulin  (HCC)  Z79.4     3. Long term (current) use of oral hypoglycemic drugs  Z79.84     4. Primary open angle glaucoma of both eyes, unspecified glaucoma stage  H40.1130     5. Bilateral ocular hypertension  H40.053     6. Pseudophakia, both eyes  Z96.1  1-3. 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  - s/p IVA OD #1 (02.27.25), #2 (03.27.25), #3 (05.01.25)  - OS without DME  - last A1c was 8.4 on 01.20.25  - pt lost to follow up from May 2019-December 2022 and then from December 2022-January 2025 - OCT shows OD: persistent central cystic changes /  DME at 5 wks; OS: Interval increase in scattered cystic changes, Partial PVD  - BCVA OD stable at 20/20; OS stable at 20/20 -- s/p CE IOL OU - discussed findings, prognosis, and treatment options - recommend IVA OD #4 today, 06.05.25 for persistent DME with follow up back to 4-5 weeks - pt wishes to proceed with injection - RBA of procedure discussed, questions answered - IVA informed consent obtained and signed, 02.27.25 - see procedure note  - f/u in 5 wks -- DFE/OCT, possible injxn  4,5. POAG / Ocular hypertension OU  - IOP 15 OD and 17 OS today - +cupping w/ rim thinning OU - denies family hx of glaucoma - on timolol  6. Pseudophakia OU  - s/p CE/IOL OS (Dr. Carloyn Chi, OS: 03.13.25; OD: 04.22.25)  - IOL in good position, doing well  - post op drops per Dr. Carloyn Chi  - monitor   Ophthalmic Meds Ordered this visit:  Meds ordered this encounter  Medications   Bevacizumab  (AVASTIN ) SOLN 1.25 mg     Return for f/u 4-5 weeks, NPDR OU, DFE, OCT, Possible Injxn.  There are no Patient Instructions on file for this visit.   Explained the diagnoses, plan, and follow up with the patient and they expressed understanding.  Patient expressed understanding of the importance of proper follow up care.   This document serves as a record of services personally performed by Jeanice Millard, MD, PhD. It was created on their behalf by Morley Arabia. Bevin Bucks, OA an ophthalmic technician. The creation of this record is the provider's dictation and/or activities during the visit.    Electronically signed by: Morley Arabia. Bevin Bucks, OA 09/15/23 2:02 AM   Jeanice Millard, M.D., Ph.D. Diseases & Surgery of the Retina and Vitreous [facility]  I have reviewed the above documentation for accuracy and completeness, and I agree with the above. Jeanice Millard, M.D., Ph.D. 09/15/23 2:04 AM    Abbreviations: M myopia (nearsighted); A astigmatism; H hyperopia (farsighted); P presbyopia; Mrx spectacle  prescription;  CTL contact lenses; OD right eye; OS left eye; OU both eyes  XT exotropia; ET esotropia; PEK punctate epithelial keratitis; PEE punctate epithelial erosions; DES dry eye syndrome; MGD meibomian gland dysfunction; ATs artificial tears; PFAT's preservative free artificial tears; NSC nuclear sclerotic cataract; PSC posterior subcapsular cataract; ERM epi-retinal membrane; PVD posterior vitreous detachment; RD retinal detachment; DM diabetes mellitus; DR diabetic retinopathy; NPDR non-proliferative diabetic retinopathy; PDR proliferative diabetic retinopathy; CSME clinically significant macular edema; DME diabetic macular edema; dbh dot blot hemorrhages; CWS cotton wool spot; POAG primary open angle glaucoma; C/D cup-to-disc ratio; HVF humphrey visual field; GVF goldmann visual field; OCT optical coherence tomography; IOP intraocular pressure; BRVO Branch retinal vein occlusion; CRVO central retinal vein occlusion; CRAO central retinal artery occlusion; BRAO branch retinal artery occlusion; RT retinal tear; SB scleral buckle; PPV pars plana vitrectomy; VH Vitreous hemorrhage; PRP panretinal laser photocoagulation; IVK intravitreal kenalog; VMT vitreomacular traction; MH Macular hole;  NVD neovascularization of the disc; NVE neovascularization elsewhere; AREDS age related eye disease study; ARMD age related macular degeneration; POAG primary open angle glaucoma; EBMD epithelial/anterior basement membrane  dystrophy; ACIOL anterior chamber intraocular lens; IOL intraocular lens; PCIOL posterior chamber intraocular lens; Phaco/IOL phacoemulsification with intraocular lens placement; PRK photorefractive keratectomy; LASIK laser assisted in situ keratomileusis; HTN hypertension; DM diabetes mellitus; COPD chronic obstructive pulmonary disease

## 2023-09-03 ENCOUNTER — Encounter: Payer: Self-pay | Admitting: Cardiovascular Disease

## 2023-09-03 ENCOUNTER — Ambulatory Visit: Attending: Cardiovascular Disease | Admitting: Cardiovascular Disease

## 2023-09-03 VITALS — BP 136/78 | HR 82 | Ht 68.0 in | Wt 193.4 lb

## 2023-09-03 DIAGNOSIS — R0609 Other forms of dyspnea: Secondary | ICD-10-CM

## 2023-09-03 DIAGNOSIS — R600 Localized edema: Secondary | ICD-10-CM | POA: Diagnosis not present

## 2023-09-03 DIAGNOSIS — I10 Essential (primary) hypertension: Secondary | ICD-10-CM | POA: Diagnosis not present

## 2023-09-03 NOTE — Patient Instructions (Signed)
 Testing/Procedures: ECHO Your physician has requested that you have an echocardiogram. Echocardiography is a painless test that uses sound waves to create images of your heart. It provides your doctor with information about the size and shape of your heart and how well your heart's chambers and valves are working. This procedure takes approximately one hour. There are no restrictions for this procedure. Please do NOT wear cologne, perfume, aftershave, or lotions (deodorant is allowed). Please arrive 15 minutes prior to your appointment time.  Please note: We ask at that you not bring children with you during ultrasound (echo/ vascular) testing. Due to room size and safety concerns, children are not allowed in the ultrasound rooms during exams. Our front office staff cannot provide observation of children in our lobby area while testing is being conducted. An adult accompanying a patient to their appointment will only be allowed in the ultrasound room at the discretion of the ultrasound technician under special circumstances. We apologize for any inconvenience.  Follow-Up: At [facility], you and your health needs are our priority.  As part of our continuing mission to provide you with exceptional heart care, our providers are all part of one team.  This team includes your primary Cardiologist (physician) and Advanced Practice Providers or APPs (Physician Assistants and Nurse Practitioners) who all work together to provide you with the care you need, when you need it.  Your next appointment:   6 month(s)  Provider:   APP

## 2023-09-03 NOTE — Progress Notes (Signed)
  Cardiology Office Note   Date:  09/03/2023  ID:  Raymond Munoz., DOB 26-Aug-1950, MRN [id number] PCP: Wilburn Handler, MD  [facility] Providers Cardiologist:  None     History of Present Illness Raymond Munoz. is a 73 y.o. male who we are asked to see for worsening dyspnea.  Hx of DM2, HLD, HTN  Developed asthma symptoms ,  was given albuterol    Dyspnea started in Jan.  Tested negative for COVID ,   No CP  Venous duplex was negative for DVT in April, 2025     Latest Reference Range & Units 04/22/23 11:06 05/04/23 12:01  Influenza A By PCR NEGATIVE  NEGATIVE NEGATIVE  Influenza B By PCR NEGATIVE  NEGATIVE NEGATIVE  Respiratory Syncytial Virus by PCR NEGATIVE  NEGATIVE NEGATIVE  SARS Coronavirus 2 by RT PCR NEGATIVE  NEGATIVE NEGATIVE    His dyspnea seems to be getting better  Walks regularly   Is trying to avoid excess salt   Retired from [employer] from [employer]     ROS:   Studies Reviewed       Risk Assessment/Calculations           Physical Exam VS:  BP 136/78   Pulse 82   Ht 5\' 8"  (1.727 m)   Wt 193 lb 6.4 oz (87.7 kg)   SpO2 96%   BMI 29.41 kg/m    Wt Readings from Last 3 Encounters:  09/03/23 193 lb 6.4 oz (87.7 kg)  05/04/23 190 lb (86.2 kg)  04/22/23 191 lb 12.8 oz (87 kg)    GEN: Well nourished, well developed in no acute distress NECK: No JVD; No carotid bruits CARDIAC: RRR, no murmurs, rubs, gallops RESPIRATORY:  Clear to auscultation without rales, wheezing or rhonchi  ABDOMEN: Soft, non-tender, non-distended EXTREMITIES:  No edema; No deformity   ASSESSMENT AND PLAN   DOE:  his shortness of breath is already better.  He is avoiding salt.   Has been exercising.  Hisleg edema is better . Will get an echo for further evaluation .    BP looks fine. No change in meds - but I told him to avoid hot dogs and other processed meats There is a good chance that he will be able to stop his lasix  if he avoids excess salt  ( and if echo looks ok   Will have him follow up in 6 months   Call sooner for problems          Dispo: 6 months    Signed, Ahmad Alert, MD

## 2023-09-05 ENCOUNTER — Ambulatory Visit (INDEPENDENT_AMBULATORY_CARE_PROVIDER_SITE_OTHER): Admitting: Ophthalmology

## 2023-09-05 ENCOUNTER — Encounter (INDEPENDENT_AMBULATORY_CARE_PROVIDER_SITE_OTHER): Payer: Self-pay | Admitting: Ophthalmology

## 2023-09-05 DIAGNOSIS — E113313 Type 2 diabetes mellitus with moderate nonproliferative diabetic retinopathy with macular edema, bilateral: Secondary | ICD-10-CM | POA: Diagnosis not present

## 2023-09-05 DIAGNOSIS — Z7984 Long term (current) use of oral hypoglycemic drugs: Secondary | ICD-10-CM | POA: Diagnosis not present

## 2023-09-05 DIAGNOSIS — H40053 Ocular hypertension, bilateral: Secondary | ICD-10-CM

## 2023-09-05 DIAGNOSIS — Z794 Long term (current) use of insulin: Secondary | ICD-10-CM

## 2023-09-05 DIAGNOSIS — H401130 Primary open-angle glaucoma, bilateral, stage unspecified: Secondary | ICD-10-CM | POA: Diagnosis not present

## 2023-09-05 DIAGNOSIS — Z961 Presence of intraocular lens: Secondary | ICD-10-CM

## 2023-09-05 MED ORDER — BEVACIZUMAB CHEMO INJECTION 1.25MG/0.05ML SYRINGE FOR KALEIDOSCOPE
1.2500 mg | INTRAVITREAL | Status: AC | PRN
  Administered 2023-09-05: 1.25 mg via INTRAVITREAL

## 2023-09-16 NOTE — Progress Notes (Addendum)
 [facility] - Clinic Note  09/05/2023     CHIEF COMPLAINT Patient presents for Retina Follow Up    HISTORY OF PRESENT ILLNESS: Raymond Munoz. is a 73 y.o. male who presents to the clinic today for:   HPI     Retina Follow Up           Diagnosis: Diabetic Retinopathy   Laterality: both eyes   Severity: moderate   Duration: 4 weeks   Course: gradually improving   MD Performed: performed the HPI with the patient and updated documentation appropriately         Comments   Pt here for 4 wk ret f/u NPDR OU. Pt states VA is good, improving. Pt reports taking a blue cap gtt BID OU, purple cap gtt BID OU, grey cap at bedtime OU.       Last edited by Ronelle Coffee, MD on 09/05/2023 12:40 PM.    Pt thinks his vision is doing well, he states his blood sugar / blood pressure was elevated a couple times over the past few weeks  Referring physician: Wilburn Handler, MD [address] [place],  [place] [address]  HISTORICAL INFORMATION:   Selected notes from the MEDICAL RECORD NUMBER Referred by Dr. Wilburn Handler for DM exam LEE:  Ocular Hx- PMH-DM (on Janumet, Lantus  and Metformin ), HTN, arthritis    CURRENT MEDICATIONS: No current outpatient medications on file. (Ophthalmic Drugs)   No current facility-administered medications for this visit. (Ophthalmic Drugs)   Current Outpatient Medications (Other)  Medication Sig   ACETAMINOPHEN  PO Take 650 mg by mouth as needed.   albuterol  (VENTOLIN  HFA) 108 (90 Base) MCG/ACT inhaler Inhale 1-2 puffs into the lungs every 6 (six) hours as needed for wheezing or shortness of breath.   amLODipine-benazepril (LOTREL) 5-40 MG capsule Take 1 capsule by mouth daily.   aspirin 81 MG chewable tablet Chew 81 mg by mouth daily.   atorvastatin (LIPITOR) 20 MG tablet Take 20 mg by mouth daily.   fexofenadine (ALLEGRA) 180 MG tablet Take 180 mg by mouth daily.   furosemide  (LASIX ) 20 MG tablet Take 20 mg by mouth 3 (three)  times daily.   gabapentin  (NEURONTIN ) 300 MG capsule Take 300-600 mg by mouth 2 (two) times daily. 300mg  in the morning and 600mg  at bedtime   gemfibrozil  (LOPID ) 600 MG tablet Take 600 mg by mouth 2 (two) times daily before a meal.    glucose blood (FREESTYLE LITE) test strip Use as instructed   hydrALAZINE  (APRESOLINE ) 25 MG tablet Take 25 mg by mouth at bedtime.   insulin  glargine (LANTUS  SOLOSTAR) 100 UNIT/ML Solostar Pen Inject 40 Units into the skin at bedtime.   Lancets (FREESTYLE) lancets Use as instructed   metFORMIN  (GLUCOPHAGE ) 1000 MG tablet Take 1 tablet (1,000 mg total) by mouth daily with breakfast.   Omega 3 1200 MG CAPS Take 2,400 mg by mouth 2 (two) times daily.   omeprazole  (PRILOSEC  OTC) 20 MG tablet Take 2 tablets (40 mg total) by mouth daily.   potassium chloride  SA (KLOR-CON  M) 20 MEQ tablet Take 20 mEq by mouth daily.   vitamin C (ASCORBIC ACID) 500 MG tablet Take 500 mg by mouth daily.   No current facility-administered medications for this visit. (Other)   REVIEW OF SYSTEMS: ROS   Positive for: Endocrine, Eyes Negative for: Constitutional, Gastrointestinal, Neurological, Skin, Genitourinary, Musculoskeletal, HENT, Cardiovascular, Respiratory, Psychiatric, Allergic/Imm, Heme/Lymph Last edited by Anthony Bateman, COT  on 09/05/2023  8:05 AM.      ALLERGIES Allergies  Allergen Reactions   Codeine     unknown   Ibuprofen     REACTION: unspecified   Aspirin Nausea Only    Irritates stomach-can take 81 mg   PAST MEDICAL HISTORY Past Medical History:  Diagnosis Date   Anxiety    Arthritis    Chest pain    Diabetes mellitus without complication (HCC)    Type 2   Dizziness    DM (diabetes mellitus) (HCC)    ED (erectile dysfunction)    Enlarged heart    GERD (gastroesophageal reflux disease)    HLD (hyperlipidemia)    Hypertension    Localized swelling of both lower legs    Pleural effusion    Shortness of breath    Tachycardia    Past Surgical  History:  Procedure Laterality Date   BIOPSY  04/24/2023   Procedure: BIOPSY;  Surgeon: Felecia Hopper, MD;  Location: [facility];  Service: Gastroenterology;;   CATARACT EXTRACTION     COLONOSCOPY WITH PROPOFOL  N/A 04/24/2023   Procedure: COLONOSCOPY WITH PROPOFOL ;  Surgeon: Felecia Hopper, MD;  Location: [facility];  Service: Gastroenterology;  Laterality: N/A;   ESOPHAGOGASTRODUODENOSCOPY (EGD) WITH PROPOFOL  N/A 04/24/2023   Procedure: ESOPHAGOGASTRODUODENOSCOPY (EGD) WITH PROPOFOL ;  Surgeon: Felecia Hopper, MD;  Location: [facility];  Service: Gastroenterology;  Laterality: N/A;   KNEE SURGERY Left    POLYPECTOMY  04/24/2023   Procedure: POLYPECTOMY;  Surgeon: Felecia Hopper, MD;  Location: [facility];  Service: Gastroenterology;;   RADIOLOGY WITH ANESTHESIA N/A 08/26/2014   Procedure: MRI LUMBAR SPINE   (RADIOLOGY WITH ANESTHESIA);  Surgeon: Medication Radiologist, MD;  Location: [facility];  Service: Radiology;  Laterality: N/A;   FAMILY HISTORY Family History  Problem Relation Age of Onset   Hypertension Mother    Diabetes type II Mother    Thyroid  disease Mother    SOCIAL HISTORY Social History   Tobacco Use   Smoking status: Never   Smokeless tobacco: Never  Vaping Use   Vaping status: Never Used  Substance Use Topics   Alcohol use: No   Drug use: No       OPHTHALMIC EXAM:   Base Eye Exam     Visual Acuity (Snellen - Linear)       Right Left   Dist [place] 20/20 -1 20/20 -2         Tonometry (Tonopen, 8:11 AM)       Right Left   Pressure 15 17         Pupils       Pupils Dark Light Shape React APD   Right PERRL 3 2 Round Brisk None   Left PERRL 3 2 Round Brisk None         Visual Fields (Counting fingers)       Left Right    Full Full         Extraocular Movement       Right Left    Full, Ortho Full, Ortho         Neuro/Psych     Oriented x3: Yes   Mood/Affect: Normal         Dilation     Both eyes: 1.0%  Mydriacyl, 2.5% Phenylephrine @ 8:11 AM           Slit Lamp and Fundus Exam     Slit Lamp Exam       Right Left   Lids/Lashes Dermatochalasis -  upper lid Dermatochalasis - upper lid   Conjunctiva/Sclera Nasal and temporal Pinguecula, mild melanosis Nasal and temporal Pinguecula, Melanosis   Cornea Arcus, tear film debris, mild EBMD superiorly, well healed cataract wound Arcus, well healed cataract wound   Anterior Chamber Deep,  deep and clear   Iris Round and dilated, No NVI Round and dilated, No NVI   Lens PC IOL in good position PC IOL in good position   Vitreous Vitreous syneresis Vitreous syneresis         Fundus Exam       Right Left   Disc Pink and Sharp, +cupping Mild pallor, sharp rim, +PPP, Thin superior rim, +cupping   C/D Ratio 0.6 0.75   Macula Good foveal reflex, central cystic changes -- slightly increased, scattered MA / DBH -- slightly improved Flat, Good foveal reflex, rare MA / DBH, trace cystic changes   Vessels attenuated attenuated, mild tortuosity   Periphery Attached, scattered MA / DBH greatest posteriorly Attached, no heme           IMAGING AND PROCEDURES  Imaging and Procedures for @TODAY @  OCT, Retina - OU - Both Eyes       Right Eye Quality was good. Central Foveal Thickness: 337. Progression has been stable. Findings include normal foveal contour, no SRF, intraretinal fluid, vitreomacular adhesion (Persistent central cystic changes / DME).   Left Eye Quality was good. Progression has worsened. Findings include normal foveal contour, no IRF, no SRF (Interval increase in scattered cystic changes, Partial PVD).   Notes *Images captured and stored on drive  Diagnosis / Impression:  OD: persistent central cystic changes / DME  OS: Interval increase in scattered cystic changes, Partial PVD  Clinical management:  See below  Abbreviations: NFP - Normal foveal profile. CME - cystoid macular edema. PED - pigment epithelial detachment. IRF  - intraretinal fluid. SRF - subretinal fluid. EZ - ellipsoid zone. ERM - epiretinal membrane. ORA - outer retinal atrophy. ORT - outer retinal tubulation. SRHM - subretinal hyper-reflective material       Intravitreal Injection, Pharmacologic Agent - OD - Right Eye       Time Out 09/05/2023. 8:57 AM. Confirmed correct patient, procedure, site, and patient consented.   Anesthesia Topical anesthesia was used. Anesthetic medications included Lidocaine  2%, Proparacaine 0.5%.   Procedure Preparation included 5% betadine to ocular surface, eyelid speculum. A supplied (32g) needle was used.   Injection: 1.25 mg Bevacizumab  1.25mg /0.67ml   Route: Intravitreal, Site: Right Eye   NDC: [id number], Lot: [id number], Expiration date: 10/11/2023   Post-op Post injection exam found visual acuity of at least counting fingers. The patient tolerated the procedure well. There were no complications. The patient received written and verbal post procedure care education.            ASSESSMENT/PLAN:    ICD-10-CM   1. Moderate nonproliferative diabetic retinopathy of both eyes with macular edema associated with type 2 diabetes mellitus (HCC)  E11.3313 OCT, Retina - OU - Both Eyes    Intravitreal Injection, Pharmacologic Agent - OD - Right Eye    Bevacizumab  (AVASTIN ) SOLN 1.25 mg    2. Current use of insulin  (HCC)  Z79.4     3. Long term (current) use of oral hypoglycemic drugs  Z79.84     4. Primary open angle glaucoma of both eyes, unspecified glaucoma stage  H40.1130     5. Bilateral ocular hypertension  H40.053     6. Pseudophakia, both eyes  Z96.1  1-3. 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  - s/p IVA OD #1 (02.27.25), #2 (03.27.25), #3 (05.01.25)  - OS without DME  - last A1c was 8.4 on 01.20.25  - pt lost to follow up from May 2019-December 2022 and then from December 2022-January 2025 - OCT shows OD: persistent central cystic changes / DME at 5 wks; OS: Interval  increase in scattered cystic changes, Partial PVD  **discussed decreased efficacy / resistance to Avastin  and potential benefit of switching from Avastin  to Eylea** - BCVA OD stable at 20/20; OS stable at 20/20 -- s/p CE IOL OU - discussed findings, prognosis, and treatment options - recommend IVA OD #4 today, 06.05.25 for persistent DME with follow up back to 4-5 weeks - pt wishes to proceed with injection - RBA of procedure discussed, questions answered - IVA informed consent obtained and signed, 02.27.25 - see procedure note - will check Eylea auth for next visit  - f/u in 5 wks -- DFE/OCT, possible injxn  4,5. POAG / Ocular hypertension OU  - IOP 15 OD and 17 OS today - +cupping w/ rim thinning OU - denies family hx of glaucoma - on timolol  6. Pseudophakia OU  - s/p CE/IOL OS (Dr. Carloyn Chi, OS: 03.13.25; OD: 04.22.25)  - IOL in good position, doing well  - post op drops per Dr. Carloyn Chi  - monitor   Ophthalmic Meds Ordered this visit:  Meds ordered this encounter  Medications   Bevacizumab  (AVASTIN ) SOLN 1.25 mg     Return for f/u 4-5 weeks, NPDR OU, DFE, OCT, Possible Injxn.  There are no Patient Instructions on file for this visit.   Explained the diagnoses, plan, and follow up with the patient and they expressed understanding.  Patient expressed understanding of the importance of proper follow up care.   This document serves as a record of services personally performed by Jeanice Millard, MD, PhD. It was created on their behalf by Morley Arabia. Bevin Bucks, OA an ophthalmic technician. The creation of this record is the provider's dictation and/or activities during the visit.    Electronically signed by: Morley Arabia. Bevin Bucks, OA 09/16/23 1:28 PM   Jeanice Millard, M.D., Ph.D. Diseases & Surgery of the Retina and Vitreous [facility]  I have reviewed the above documentation for accuracy and completeness, and I agree with the above. Jeanice Millard, M.D., Ph.D.  09/15/23 1:28 PM    Abbreviations: M myopia (nearsighted); A astigmatism; H hyperopia (farsighted); P presbyopia; Mrx spectacle prescription;  CTL contact lenses; OD right eye; OS left eye; OU both eyes  XT exotropia; ET esotropia; PEK punctate epithelial keratitis; PEE punctate epithelial erosions; DES dry eye syndrome; MGD meibomian gland dysfunction; ATs artificial tears; PFAT's preservative free artificial tears; NSC nuclear sclerotic cataract; PSC posterior subcapsular cataract; ERM epi-retinal membrane; PVD posterior vitreous detachment; RD retinal detachment; DM diabetes mellitus; DR diabetic retinopathy; NPDR non-proliferative diabetic retinopathy; PDR proliferative diabetic retinopathy; CSME clinically significant macular edema; DME diabetic macular edema; dbh dot blot hemorrhages; CWS cotton wool spot; POAG primary open angle glaucoma; C/D cup-to-disc ratio; HVF humphrey visual field; GVF goldmann visual field; OCT optical coherence tomography; IOP intraocular pressure; BRVO Branch retinal vein occlusion; CRVO central retinal vein occlusion; CRAO central retinal artery occlusion; BRAO branch retinal artery occlusion; RT retinal tear; SB scleral buckle; PPV pars plana vitrectomy; VH Vitreous hemorrhage; PRP panretinal laser photocoagulation; IVK intravitreal kenalog; VMT vitreomacular traction; MH Macular hole;  NVD neovascularization of the  disc; NVE neovascularization elsewhere; AREDS age related eye disease study; ARMD age related macular degeneration; POAG primary open angle glaucoma; EBMD epithelial/anterior basement membrane dystrophy; ACIOL anterior chamber intraocular lens; IOL intraocular lens; PCIOL posterior chamber intraocular lens; Phaco/IOL phacoemulsification with intraocular lens placement; PRK photorefractive keratectomy; LASIK laser assisted in situ keratomileusis; HTN hypertension; DM diabetes mellitus; COPD chronic obstructive pulmonary disease

## 2023-10-01 NOTE — Progress Notes (Signed)
 [facility] - Clinic Note  10/10/2023     CHIEF COMPLAINT Patient presents for Retina Follow Up    HISTORY OF PRESENT ILLNESS: Raymond Munoz. is a 73 y.o. male who presents to the clinic today for:   HPI     Retina Follow Up   Patient presents with  Diabetic Retinopathy.  Since onset it is stable.  I, the attending physician,  performed the HPI with the patient and updated documentation appropriately.        Comments   Pt states no changes in vision. Pt denies FOL/floaters/pain. Pt is now on Brimonidine BID OU, Dorz-Tim BID OU, and Ketorolac  qam OD. Pt has discontinued Timolol. Pt does not use ats. A1c=8.0, two weeks ago BS=180 last night      Last edited by Valdemar Rogue, MD on 10/10/2023  2:08 PM.    Pt thinks his vision is doing well, he states his blood sugar / blood pressure was elevated a couple times over the past few weeks  Referring physician: Leigh Lung, MD [address] [place],  [place] [address]  HISTORICAL INFORMATION:   Selected notes from the MEDICAL RECORD NUMBER Referred by Dr. Lung Leigh for DM exam LEE:  Ocular Hx- PMH-DM (on Janumet, Lantus  and Metformin ), HTN, arthritis    CURRENT MEDICATIONS: Current Outpatient Medications (Ophthalmic Drugs)  Medication Sig   brimonidine (ALPHAGAN) 0.2 % ophthalmic solution 1 drop 2 (two) times daily.   dorzolamide-timolol (COSOPT) 2-0.5 % ophthalmic solution 1 drop 2 (two) times daily.   ketorolac  (ACULAR ) 0.5 % ophthalmic solution Place 1 drop into the right eye 2 (two) times daily.   No current facility-administered medications for this visit. (Ophthalmic Drugs)   Current Outpatient Medications (Other)  Medication Sig   ACETAMINOPHEN  PO Take 650 mg by mouth as needed.   albuterol  (VENTOLIN  HFA) 108 (90 Base) MCG/ACT inhaler Inhale 1-2 puffs into the lungs every 6 (six) hours as needed for wheezing or shortness of breath.   amLODipine-benazepril (LOTREL) 5-40 MG capsule  Take 1 capsule by mouth daily.   aspirin 81 MG chewable tablet Chew 81 mg by mouth daily.   atorvastatin (LIPITOR) 20 MG tablet Take 20 mg by mouth daily.   fexofenadine (ALLEGRA) 180 MG tablet Take 180 mg by mouth daily.   furosemide  (LASIX ) 20 MG tablet Take 20 mg by mouth 3 (three) times daily.   gabapentin  (NEURONTIN ) 300 MG capsule Take 300-600 mg by mouth 2 (two) times daily. 300mg  in the morning and 600mg  at bedtime   gemfibrozil  (LOPID ) 600 MG tablet Take 600 mg by mouth 2 (two) times daily before a meal.    glucose blood (FREESTYLE LITE) test strip Use as instructed   hydrALAZINE  (APRESOLINE ) 25 MG tablet Take 25 mg by mouth at bedtime.   insulin  glargine (LANTUS  SOLOSTAR) 100 UNIT/ML Solostar Pen Inject 40 Units into the skin at bedtime.   Lancets (FREESTYLE) lancets Use as instructed   metFORMIN  (GLUCOPHAGE ) 1000 MG tablet Take 1 tablet (1,000 mg total) by mouth daily with breakfast.   Omega 3 1200 MG CAPS Take 2,400 mg by mouth 2 (two) times daily.   omeprazole  (PRILOSEC  OTC) 20 MG tablet Take 2 tablets (40 mg total) by mouth daily.   potassium chloride  SA (KLOR-CON  M) 20 MEQ tablet Take 20 mEq by mouth daily.   vitamin C (ASCORBIC ACID) 500 MG tablet Take 500 mg by mouth daily.   No current facility-administered medications for this  visit. (Other)   REVIEW OF SYSTEMS: ROS   Positive for: Endocrine, Eyes Negative for: Constitutional, Gastrointestinal, Neurological, Skin, Genitourinary, Musculoskeletal, HENT, Cardiovascular, Respiratory, Psychiatric, Allergic/Imm, Heme/Lymph Last edited by Elnor Avelina RAMAN, COT on 10/10/2023  8:32 AM.       ALLERGIES Allergies  Allergen Reactions   Codeine     unknown   Ibuprofen     REACTION: unspecified   Aspirin Nausea Only    Irritates stomach-can take 81 mg   PAST MEDICAL HISTORY Past Medical History:  Diagnosis Date   Anxiety    Arthritis    Chest pain    Diabetes mellitus without complication (HCC)    Type 2   Dizziness     DM (diabetes mellitus) (HCC)    ED (erectile dysfunction)    Enlarged heart    GERD (gastroesophageal reflux disease)    HLD (hyperlipidemia)    Hypertension    Localized swelling of both lower legs    Pleural effusion    Shortness of breath    Tachycardia    Past Surgical History:  Procedure Laterality Date   BIOPSY  04/24/2023   Procedure: BIOPSY;  Surgeon: Elicia Claw, MD;  Location: [facility];  Service: Gastroenterology;;   CATARACT EXTRACTION     COLONOSCOPY WITH PROPOFOL  N/A 04/24/2023   Procedure: COLONOSCOPY WITH PROPOFOL ;  Surgeon: Elicia Claw, MD;  Location: [facility];  Service: Gastroenterology;  Laterality: N/A;   ESOPHAGOGASTRODUODENOSCOPY (EGD) WITH PROPOFOL  N/A 04/24/2023   Procedure: ESOPHAGOGASTRODUODENOSCOPY (EGD) WITH PROPOFOL ;  Surgeon: Elicia Claw, MD;  Location: [facility];  Service: Gastroenterology;  Laterality: N/A;   KNEE SURGERY Left    POLYPECTOMY  04/24/2023   Procedure: POLYPECTOMY;  Surgeon: Elicia Claw, MD;  Location: [facility];  Service: Gastroenterology;;   RADIOLOGY WITH ANESTHESIA N/A 08/26/2014   Procedure: MRI LUMBAR SPINE   (RADIOLOGY WITH ANESTHESIA);  Surgeon: Medication Radiologist, MD;  Location: [facility];  Service: Radiology;  Laterality: N/A;   FAMILY HISTORY Family History  Problem Relation Age of Onset   Hypertension Mother    Diabetes type II Mother    Thyroid  disease Mother    SOCIAL HISTORY Social History   Tobacco Use   Smoking status: Never   Smokeless tobacco: Never  Vaping Use   Vaping status: Never Used  Substance Use Topics   Alcohol use: No   Drug use: No       OPHTHALMIC EXAM:   Base Eye Exam     Visual Acuity (Snellen - Linear)       Right Left   Dist [place] 20/20 -2 20/20 -2         Tonometry (Tonopen, 8:29 AM)       Right Left   Pressure 23 25         Pupils       Pupils Dark Light Shape React APD   Right PERRL 3 2 Round Brisk None   Left PERRL 3 2 Round  Brisk None         Visual Fields       Left Right    Full Full         Extraocular Movement       Right Left    Full, Ortho Full, Ortho         Neuro/Psych     Oriented x3: Yes   Mood/Affect: Normal         Dilation     Both eyes: 1.0% Mydriacyl, 2.5% Phenylephrine @ 8:29 AM  Slit Lamp and Fundus Exam     Slit Lamp Exam       Right Left   Lids/Lashes Dermatochalasis - upper lid Dermatochalasis - upper lid   Conjunctiva/Sclera Nasal and temporal Pinguecula, mild melanosis Nasal and temporal Pinguecula, Melanosis   Cornea Arcus, tear film debris, mild EBMD superiorly, well healed cataract wound Arcus, well healed cataract wound   Anterior Chamber Deep,  deep and clear   Iris Round and dilated, No NVI Round and dilated, No NVI   Lens PC IOL in good position PC IOL in good position   Anterior Vitreous Vitreous syneresis Vitreous syneresis         Fundus Exam       Right Left   Disc Pink and Sharp, +cupping Mild pallor, sharp rim, +PPP, Thin superior rim, +cupping   C/D Ratio 0.6 0.75   Macula Good foveal reflex, central cystic changes -- slightly improved, rare MA -- improving Flat, Good foveal reflex, rare MA / DBH   Vessels attenuated attenuated, mild tortuosity   Periphery Attached, scattered MA / DBH greatest posteriorly Attached, no heme           IMAGING AND PROCEDURES  Imaging and Procedures for @TODAY @  OCT, Retina - OU - Both Eyes       Right Eye Quality was good. Central Foveal Thickness: 324. Progression has improved. Findings include normal foveal contour, no SRF, intraretinal fluid, vitreomacular adhesion (Persistent central cystic changes / DME -- slightly improved).   Left Eye Quality was good. Central Foveal Thickness: 267. Progression has worsened. Findings include normal foveal contour, no IRF, no SRF (Interval increase in scattered cystic changes, Partial PVD).   Notes *Images captured and stored on  drive  Diagnosis / Impression:  OD: persistent central cystic changes / DME -- slightly improved OS: Interval increase in scattered cystic changes, Partial PVD  Clinical management:  See below  Abbreviations: NFP - Normal foveal profile. CME - cystoid macular edema. PED - pigment epithelial detachment. IRF - intraretinal fluid. SRF - subretinal fluid. EZ - ellipsoid zone. ERM - epiretinal membrane. ORA - outer retinal atrophy. ORT - outer retinal tubulation. SRHM - subretinal hyper-reflective material       Intravitreal Injection, Pharmacologic Agent - OD - Right Eye       Time Out 10/10/2023. 9:19 AM. Confirmed correct patient, procedure, site, and patient consented.   Anesthesia Topical anesthesia was used. Anesthetic medications included Lidocaine  2%, Proparacaine 0.5%.   Procedure Preparation included 5% betadine to ocular surface, eyelid speculum. A supplied (32g) needle was used.   Injection: 1.25 mg Bevacizumab  1.25mg /0.35ml   Route: Intravitreal, Site: Right Eye   NDC: [id number], Lot: [id number], Expiration date: 10/28/2023   Post-op Post injection exam found visual acuity of at least counting fingers. The patient tolerated the procedure well. There were no complications. The patient received written and verbal post procedure care education.            ASSESSMENT/PLAN:    ICD-10-CM   1. Moderate nonproliferative diabetic retinopathy of both eyes with macular edema associated with type 2 diabetes mellitus (HCC)  E11.3313 OCT, Retina - OU - Both Eyes    Intravitreal Injection, Pharmacologic Agent - OD - Right Eye    Bevacizumab  (AVASTIN ) SOLN 1.25 mg    2. Current use of insulin  (HCC)  Z79.4     3. Long term (current) use of oral hypoglycemic drugs  Z79.84     4. Primary open angle glaucoma of both  eyes, unspecified glaucoma stage  H40.1130     5. Bilateral ocular hypertension  H40.053     6. Pseudophakia, both eyes  Z96.1      1-3. Moderate  non-proliferative diabetic retinopathy w/ DME OD  - s/p IVA OD #1 (02.27.25), #2 (03.27.25), #3 (05.01.25), #4 (06.05.25)  - OS without DME  - last A1c was 8.4 on 01.20.25  - pt lost to follow up from May 2019-December 2022 and then from December 2022-January 2025 - OCT shows OD: persistent central cystic changes / DME -- slightly improved; OS: Interval increase in scattered cystic changes, Partial PVD  - BCVA OD stable at 20/20; OS stable at 20/20 -- s/p CE IOL OU - discussed findings, prognosis, and treatment options - recommend IVA OD #5 today, 07.10.25 for persistent DME with follow up at 5 weeks again - pt wishes to proceed with injection - RBA of procedure discussed, questions answered - IVA informed consent obtained and signed, 02.27.25 - see procedure note - Eylea approved, but pt owes 20% -- pt wishes to stay with Avastin   - f/u in 5 wks -- DFE/OCT, possible injxn  4,5. POAG / Ocular hypertension OU  - IOP 23,25 - +cupping w/ rim thinning OU - denies family hx of glaucoma - on brimondine and Cosopt BID OU, discontinued timolol per Dr. Fleeta  6. Pseudophakia OU  - s/p CE/IOL OS (Dr. Fleeta, OS: 03.13.25; OD: 04.22.25)  - IOL in good position, doing well  - post op drops per Dr. Fleeta  - monitor   Ophthalmic Meds Ordered this visit:  Meds ordered this encounter  Medications   Bevacizumab  (AVASTIN ) SOLN 1.25 mg     Return in about 5 weeks (around 11/14/2023) for f/u NPDR OU, DFE, OCT, Possible Injxn.  There are no Patient Instructions on file for this visit.   Explained the diagnoses, plan, and follow up with the patient and they expressed understanding.  Patient expressed understanding of the importance of proper follow up care.   This document serves as a record of services personally performed by Redell JUDITHANN Hans, MD, PhD. It was created on their behalf by Alan PARAS. Delores, OA an ophthalmic technician. The creation of this record is the provider's dictation and/or activities  during the visit.    Electronically signed by: Alan PARAS. Delores, OA 10/10/23 10:49 PM  Redell JUDITHANN Hans, M.D., Ph.D. Diseases & Surgery of the Retina and Vitreous [facility]  I have reviewed the above documentation for accuracy and completeness, and I agree with the above. Redell JUDITHANN Hans, M.D., Ph.D. 10/10/23 10:51 PM    Abbreviations: M myopia (nearsighted); A astigmatism; H hyperopia (farsighted); P presbyopia; Mrx spectacle prescription;  CTL contact lenses; OD right eye; OS left eye; OU both eyes  XT exotropia; ET esotropia; PEK punctate epithelial keratitis; PEE punctate epithelial erosions; DES dry eye syndrome; MGD meibomian gland dysfunction; ATs artificial tears; PFAT's preservative free artificial tears; NSC nuclear sclerotic cataract; PSC posterior subcapsular cataract; ERM epi-retinal membrane; PVD posterior vitreous detachment; RD retinal detachment; DM diabetes mellitus; DR diabetic retinopathy; NPDR non-proliferative diabetic retinopathy; PDR proliferative diabetic retinopathy; CSME clinically significant macular edema; DME diabetic macular edema; dbh dot blot hemorrhages; CWS cotton wool spot; POAG primary open angle glaucoma; C/D cup-to-disc ratio; HVF humphrey visual field; GVF goldmann visual field; OCT optical coherence tomography; IOP intraocular pressure; BRVO Branch retinal vein occlusion; CRVO central retinal vein occlusion; CRAO central retinal artery occlusion; BRAO branch retinal artery occlusion; RT  retinal tear; SB scleral buckle; PPV pars plana vitrectomy; VH Vitreous hemorrhage; PRP panretinal laser photocoagulation; IVK intravitreal kenalog; VMT vitreomacular traction; MH Macular hole;  NVD neovascularization of the disc; NVE neovascularization elsewhere; AREDS age related eye disease study; ARMD age related macular degeneration; POAG primary open angle glaucoma; EBMD epithelial/anterior basement membrane dystrophy; ACIOL anterior chamber intraocular  lens; IOL intraocular lens; PCIOL posterior chamber intraocular lens; Phaco/IOL phacoemulsification with intraocular lens placement; PRK photorefractive keratectomy; LASIK laser assisted in situ keratomileusis; HTN hypertension; DM diabetes mellitus; COPD chronic obstructive pulmonary disease

## 2023-10-10 ENCOUNTER — Encounter (INDEPENDENT_AMBULATORY_CARE_PROVIDER_SITE_OTHER): Payer: Self-pay | Admitting: Ophthalmology

## 2023-10-10 ENCOUNTER — Ambulatory Visit (INDEPENDENT_AMBULATORY_CARE_PROVIDER_SITE_OTHER): Admitting: Ophthalmology

## 2023-10-10 DIAGNOSIS — H401130 Primary open-angle glaucoma, bilateral, stage unspecified: Secondary | ICD-10-CM | POA: Diagnosis not present

## 2023-10-10 DIAGNOSIS — Z7984 Long term (current) use of oral hypoglycemic drugs: Secondary | ICD-10-CM

## 2023-10-10 DIAGNOSIS — Z961 Presence of intraocular lens: Secondary | ICD-10-CM

## 2023-10-10 DIAGNOSIS — E113313 Type 2 diabetes mellitus with moderate nonproliferative diabetic retinopathy with macular edema, bilateral: Secondary | ICD-10-CM | POA: Diagnosis not present

## 2023-10-10 DIAGNOSIS — H40053 Ocular hypertension, bilateral: Secondary | ICD-10-CM

## 2023-10-10 DIAGNOSIS — Z794 Long term (current) use of insulin: Secondary | ICD-10-CM | POA: Diagnosis not present

## 2023-10-10 MED ORDER — BEVACIZUMAB CHEMO INJECTION 1.25MG/0.05ML SYRINGE FOR KALEIDOSCOPE
1.2500 mg | INTRAVITREAL | Status: AC | PRN
  Administered 2023-10-10: 1.25 mg via INTRAVITREAL

## 2023-10-17 ENCOUNTER — Ambulatory Visit (HOSPITAL_COMMUNITY)
Admission: RE | Admit: 2023-10-17 | Discharge: 2023-10-17 | Disposition: A | Discharge status: Home / Self Care | Source: Ambulatory Visit | Attending: Cardiology | Admitting: Cardiology

## 2023-10-17 ENCOUNTER — Other Ambulatory Visit (HOSPITAL_COMMUNITY)

## 2023-10-17 DIAGNOSIS — R0609 Other forms of dyspnea: Secondary | ICD-10-CM | POA: Insufficient documentation

## 2023-10-17 DIAGNOSIS — I10 Essential (primary) hypertension: Secondary | ICD-10-CM | POA: Insufficient documentation

## 2023-10-17 DIAGNOSIS — R600 Localized edema: Secondary | ICD-10-CM | POA: Diagnosis present

## 2023-10-17 LAB — ECHOCARDIOGRAM COMPLETE
Area-P 1/2: 5.84 cm2
S' Lateral: 2.09 cm

## 2023-10-31 NOTE — Progress Notes (Signed)
 [facility] - Clinic Note  11/14/2023     CHIEF COMPLAINT Patient presents for Retina Follow Up    HISTORY OF PRESENT ILLNESS: Raymond Munoz. is a 73 y.o. male who presents to the clinic today for:   HPI     Retina Follow Up   Patient presents with  Diabetic Retinopathy.  Severity is mild.  Duration of 5 weeks.  Since onset it is stable.  I, the attending physician,  performed the HPI with the patient and updated documentation appropriately.        Comments   5 week Retina eval. Patient states vision is the same. Blood sugar 161      Last edited by Valdemar Rogue, MD on 11/14/2023  9:29 AM.    Pt states VA stable, no health changes.   Referring physician: Leigh Lung, MD [address] [place],  [place] [address]  HISTORICAL INFORMATION:   Selected notes from the MEDICAL RECORD NUMBER Referred by Dr. Lung Leigh for DM exam LEE:  Ocular Hx- PMH-DM (on Janumet, Lantus  and Metformin ), HTN, arthritis    CURRENT MEDICATIONS: Current Outpatient Medications (Ophthalmic Drugs)  Medication Sig   brimonidine (ALPHAGAN) 0.2 % ophthalmic solution 1 drop 2 (two) times daily.   dorzolamide-timolol (COSOPT) 2-0.5 % ophthalmic solution 1 drop 2 (two) times daily.   ketorolac  (ACULAR ) 0.5 % ophthalmic solution Place 1 drop into the right eye 2 (two) times daily.   No current facility-administered medications for this visit. (Ophthalmic Drugs)   Current Outpatient Medications (Other)  Medication Sig   ACETAMINOPHEN  PO Take 650 mg by mouth as needed.   albuterol  (VENTOLIN  HFA) 108 (90 Base) MCG/ACT inhaler Inhale 1-2 puffs into the lungs every 6 (six) hours as needed for wheezing or shortness of breath.   amLODipine-benazepril (LOTREL) 5-40 MG capsule Take 1 capsule by mouth daily.   aspirin 81 MG chewable tablet Chew 81 mg by mouth daily.   atorvastatin (LIPITOR) 20 MG tablet Take 20 mg by mouth daily.   fexofenadine (ALLEGRA) 180 MG tablet Take 180  mg by mouth daily.   furosemide  (LASIX ) 20 MG tablet Take 20 mg by mouth 3 (three) times daily.   gabapentin  (NEURONTIN ) 300 MG capsule Take 300-600 mg by mouth 2 (two) times daily. 300mg  in the morning and 600mg  at bedtime   gemfibrozil  (LOPID ) 600 MG tablet Take 600 mg by mouth 2 (two) times daily before a meal.    glucose blood (FREESTYLE LITE) test strip Use as instructed   hydrALAZINE  (APRESOLINE ) 25 MG tablet Take 25 mg by mouth at bedtime.   insulin  glargine (LANTUS  SOLOSTAR) 100 UNIT/ML Solostar Pen Inject 40 Units into the skin at bedtime.   Lancets (FREESTYLE) lancets Use as instructed   metFORMIN  (GLUCOPHAGE ) 1000 MG tablet Take 1 tablet (1,000 mg total) by mouth daily with breakfast.   Omega 3 1200 MG CAPS Take 2,400 mg by mouth 2 (two) times daily.   omeprazole  (PRILOSEC  OTC) 20 MG tablet Take 2 tablets (40 mg total) by mouth daily.   potassium chloride  SA (KLOR-CON  M) 20 MEQ tablet Take 20 mEq by mouth daily.   vitamin C (ASCORBIC ACID) 500 MG tablet Take 500 mg by mouth daily.   No current facility-administered medications for this visit. (Other)   REVIEW OF SYSTEMS: ROS   Positive for: Endocrine, Eyes Negative for: Constitutional, Gastrointestinal, Neurological, Skin, Genitourinary, Musculoskeletal, HENT, Cardiovascular, Respiratory, Psychiatric, Allergic/Imm, Heme/Lymph Last edited by German Olam BRAVO,  COT on 11/14/2023  9:04 AM.     ALLERGIES Allergies  Allergen Reactions   Codeine     unknown   Ibuprofen     REACTION: unspecified   Aspirin Nausea Only    Irritates stomach-can take 81 mg   PAST MEDICAL HISTORY Past Medical History:  Diagnosis Date   Anxiety    Arthritis    Chest pain    Diabetes mellitus without complication (HCC)    Type 2   Dizziness    DM (diabetes mellitus) (HCC)    ED (erectile dysfunction)    Enlarged heart    GERD (gastroesophageal reflux disease)    HLD (hyperlipidemia)    Hypertension    Localized swelling of both lower legs     Pleural effusion    Shortness of breath    Tachycardia    Past Surgical History:  Procedure Laterality Date   BIOPSY  04/24/2023   Procedure: BIOPSY;  Surgeon: Elicia Claw, MD;  Location: [facility];  Service: Gastroenterology;;   CATARACT EXTRACTION     COLONOSCOPY WITH PROPOFOL  N/A 04/24/2023   Procedure: COLONOSCOPY WITH PROPOFOL ;  Surgeon: Elicia Claw, MD;  Location: [facility];  Service: Gastroenterology;  Laterality: N/A;   ESOPHAGOGASTRODUODENOSCOPY (EGD) WITH PROPOFOL  N/A 04/24/2023   Procedure: ESOPHAGOGASTRODUODENOSCOPY (EGD) WITH PROPOFOL ;  Surgeon: Elicia Claw, MD;  Location: [facility];  Service: Gastroenterology;  Laterality: N/A;   KNEE SURGERY Left    POLYPECTOMY  04/24/2023   Procedure: POLYPECTOMY;  Surgeon: Elicia Claw, MD;  Location: [facility];  Service: Gastroenterology;;   RADIOLOGY WITH ANESTHESIA N/A 08/26/2014   Procedure: MRI LUMBAR SPINE   (RADIOLOGY WITH ANESTHESIA);  Surgeon: Medication Radiologist, MD;  Location: [facility];  Service: Radiology;  Laterality: N/A;   FAMILY HISTORY Family History  Problem Relation Age of Onset   Hypertension Mother    Diabetes type II Mother    Thyroid  disease Mother    SOCIAL HISTORY Social History   Tobacco Use   Smoking status: Never   Smokeless tobacco: Never  Vaping Use   Vaping status: Never Used  Substance Use Topics   Alcohol use: No   Drug use: No       OPHTHALMIC EXAM:   Base Eye Exam     Visual Acuity (Snellen - Linear)       Right Left   Dist [place] 20/20 -1 20/20 -1         Tonometry (Tonopen, 9:06 AM)       Right Left   Pressure 20 23         Pupils       Dark Light Shape React APD   Right 3 2 Round Brisk None   Left 3 2 Round Brisk None         Visual Fields (Counting fingers)       Left Right    Full Full         Extraocular Movement       Right Left    Full, Ortho Full, Ortho         Neuro/Psych     Oriented x3: Yes    Mood/Affect: Normal         Dilation     Both eyes: 1.0% Mydriacyl, 2.5% Phenylephrine @ 9:06 AM           Slit Lamp and Fundus Exam     Slit Lamp Exam       Right Left   Lids/Lashes Dermatochalasis - upper lid  Dermatochalasis - upper lid   Conjunctiva/Sclera Nasal and temporal Pinguecula, mild melanosis Nasal and temporal Pinguecula, Melanosis   Cornea Arcus, tear film debris, mild EBMD superiorly, well healed cataract wound Arcus, well healed cataract wound   Anterior Chamber Deep,  deep and clear   Iris Round and dilated, No NVI Round and dilated, No NVI   Lens PC IOL in good position PC IOL in good position   Anterior Vitreous Vitreous syneresis Vitreous syneresis         Fundus Exam       Right Left   Disc Pink and Sharp, +cupping Mild pallor, sharp rim, +PPP, Thin superior rim, +cupping   C/D Ratio 0.6 0.75   Macula Good foveal reflex, central cystic changes -- slightly improved, rare MA -- improving Flat, Good foveal reflex, rare MA / DBH   Vessels attenuated attenuated, mild tortuosity   Periphery Attached, scattered MA / DBH greatest posteriorly Attached, no heme           IMAGING AND PROCEDURES  Imaging and Procedures for @TODAY @  OCT, Retina - OU - Both Eyes       Right Eye Quality was good. Central Foveal Thickness: 309. Progression has improved. Findings include normal foveal contour, no SRF, intraretinal fluid, vitreomacular adhesion (Persistent central cystic changes / DME -- slightly improved).   Left Eye Quality was good. Central Foveal Thickness: 263. Progression has improved. Findings include normal foveal contour, no IRF, no SRF (Focal cystic changes inferior and nasal mac--improved, Partial PVD).   Notes *Images captured and stored on drive  Diagnosis / Impression:  OD: persistent central cystic changes / DME -- slightly improved OS: Focal cystic changes inferior and nasal mac--improved, Partial PVD  Clinical management:  See  below  Abbreviations: NFP - Normal foveal profile. CME - cystoid macular edema. PED - pigment epithelial detachment. IRF - intraretinal fluid. SRF - subretinal fluid. EZ - ellipsoid zone. ERM - epiretinal membrane. ORA - outer retinal atrophy. ORT - outer retinal tubulation. SRHM - subretinal hyper-reflective material       Intravitreal Injection, Pharmacologic Agent - OD - Right Eye       Time Out 11/14/2023. 9:36 AM. Confirmed correct patient, procedure, site, and patient consented.   Anesthesia Topical anesthesia was used. Anesthetic medications included Lidocaine  2%, Proparacaine 0.5%.   Procedure Preparation included 5% betadine to ocular surface, eyelid speculum. A supplied (32g) needle was used.   Injection: 1.25 mg Bevacizumab  1.25mg /0.41ml   Route: Intravitreal, Site: Right Eye   NDC: [id number], Lot: [id number], Expiration date: 11/25/2023   Post-op Post injection exam found visual acuity of at least counting fingers. The patient tolerated the procedure well. There were no complications. The patient received written and verbal post procedure care education.             ASSESSMENT/PLAN:    ICD-10-CM   1. Moderate nonproliferative diabetic retinopathy of both eyes with macular edema associated with type 2 diabetes mellitus (HCC)  E11.3313 OCT, Retina - OU - Both Eyes    Intravitreal Injection, Pharmacologic Agent - OD - Right Eye    Bevacizumab  (AVASTIN ) SOLN 1.25 mg    2. Current use of insulin  (HCC)  Z79.4     3. Long term (current) use of oral hypoglycemic drugs  Z79.84     4. Primary open angle glaucoma of both eyes, unspecified glaucoma stage  H40.1130     5. Bilateral ocular hypertension  H40.053     6. Pseudophakia, both eyes  Z96.1       1-3. 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  - s/p IVA OD #1 (02.27.25), #2 (03.27.25), #3 (05.01.25), #4 (06.05.25), #5 (07.10.25)  - OS without DME  - last A1c was 8.4 on 01.20.25  - pt lost to  follow up from May 2019-December 2022 and then from December 2022-January 2025 - OCT shows OD: persistent central cystic changes / DME -- slightly improved; OS: Focal cystic changes inferior and nasal mac--improved, Partial PVD at 5 weeks - BCVA OD stable at 20/20; OS stable at 20/20 -- s/p CE IOL OU - discussed findings, prognosis, and treatment options - recommend IVA OD #5 (07.10.25) , today for persistent DME with follow up at 5 weeks again - pt wishes to proceed with injection - RBA of procedure discussed, questions answered - IVA informed consent obtained and signed, 02.27.25 - see procedure note - Eylea approved, but pt owes 20% -- pt wishes to stay with Avastin   - f/u in 5 wks -- DFE/OCT, possible injection  4,5. POAG / Ocular hypertension OU  - IOP 20,23 - +cupping w/ rim thinning OU - denies family hx of glaucoma - on brimondine and Cosopt BID OU, discontinued timolol per Dr. Fleeta  6. Pseudophakia OU  - s/p CE/IOL OS (Dr. Fleeta, OS: 03.13.25; OD: 04.22.25)  - IOL in good position, doing well  - post op drops per Dr. Fleeta  - monitor   Ophthalmic Meds Ordered this visit:  Meds ordered this encounter  Medications   Bevacizumab  (AVASTIN ) SOLN 1.25 mg     Return in about 5 weeks (around 12/19/2023) for +DME, Dilated Exam, OCT, Possible Injxn.  There are no Patient Instructions on file for this visit.   Explained the diagnoses, plan, and follow up with the patient and they expressed understanding.  Patient expressed understanding of the importance of proper follow up care.   This document serves as a record of services personally performed by Redell JUDITHANN Hans, MD, PhD. It was created on their behalf by Avelina Pereyra, COA an ophthalmic technician. The creation of this record is the provider's dictation and/or activities during the visit.   Electronically signed by: Avelina GORMAN Pereyra, COT  11/14/23  10:25 AM   This document serves as a record of services personally performed by  Redell JUDITHANN Hans, MD, PhD. It was created on their behalf by Almetta Pesa, an ophthalmic technician. The creation of this record is the provider's dictation and/or activities during the visit.    Electronically signed by: Almetta Pesa, OA, 11/14/23  10:25 AM   Redell JUDITHANN Hans, M.D., Ph.D. Diseases & Surgery of the Retina and Vitreous [facility]  I have reviewed the above documentation for accuracy and completeness, and I agree with the above. Redell JUDITHANN Hans, M.D., Ph.D. 11/14/23 10:28 AM   Abbreviations: M myopia (nearsighted); A astigmatism; H hyperopia (farsighted); P presbyopia; Mrx spectacle prescription;  CTL contact lenses; OD right eye; OS left eye; OU both eyes  XT exotropia; ET esotropia; PEK punctate epithelial keratitis; PEE punctate epithelial erosions; DES dry eye syndrome; MGD meibomian gland dysfunction; ATs artificial tears; PFAT's preservative free artificial tears; NSC nuclear sclerotic cataract; PSC posterior subcapsular cataract; ERM epi-retinal membrane; PVD posterior vitreous detachment; RD retinal detachment; DM diabetes mellitus; DR diabetic retinopathy; NPDR non-proliferative diabetic retinopathy; PDR proliferative diabetic retinopathy; CSME clinically significant macular edema; DME diabetic macular edema; dbh dot blot hemorrhages; CWS cotton wool spot; POAG primary open angle glaucoma; C/D  cup-to-disc ratio; HVF humphrey visual field; GVF goldmann visual field; OCT optical coherence tomography; IOP intraocular pressure; BRVO Branch retinal vein occlusion; CRVO central retinal vein occlusion; CRAO central retinal artery occlusion; BRAO branch retinal artery occlusion; RT retinal tear; SB scleral buckle; PPV pars plana vitrectomy; VH Vitreous hemorrhage; PRP panretinal laser photocoagulation; IVK intravitreal kenalog; VMT vitreomacular traction; MH Macular hole;  NVD neovascularization of the disc; NVE neovascularization elsewhere; AREDS age related  eye disease study; ARMD age related macular degeneration; POAG primary open angle glaucoma; EBMD epithelial/anterior basement membrane dystrophy; ACIOL anterior chamber intraocular lens; IOL intraocular lens; PCIOL posterior chamber intraocular lens; Phaco/IOL phacoemulsification with intraocular lens placement; PRK photorefractive keratectomy; LASIK laser assisted in situ keratomileusis; HTN hypertension; DM diabetes mellitus; COPD chronic obstructive pulmonary disease

## 2023-11-05 ENCOUNTER — Ambulatory Visit: Payer: Self-pay | Admitting: Cardiology

## 2023-11-05 DIAGNOSIS — R0609 Other forms of dyspnea: Secondary | ICD-10-CM

## 2023-11-05 DIAGNOSIS — I10 Essential (primary) hypertension: Secondary | ICD-10-CM

## 2023-11-05 NOTE — Progress Notes (Signed)
 Normal heart function, mild to moderate leakiness of left-sided mitral valve.  Mildly elevated pressure on the right side of the heart, could be due to any underlying secondary causes such as obstructive sleep apnea if you have it.  Recommend repeat echocardiogram in 1 year to monitor this.  Otherwise, no further recommendations.  Thanks MJP

## 2023-11-14 ENCOUNTER — Encounter (INDEPENDENT_AMBULATORY_CARE_PROVIDER_SITE_OTHER): Payer: Self-pay | Admitting: Ophthalmology

## 2023-11-14 ENCOUNTER — Ambulatory Visit (INDEPENDENT_AMBULATORY_CARE_PROVIDER_SITE_OTHER): Admitting: Ophthalmology

## 2023-11-14 DIAGNOSIS — H401130 Primary open-angle glaucoma, bilateral, stage unspecified: Secondary | ICD-10-CM | POA: Diagnosis not present

## 2023-11-14 DIAGNOSIS — E113313 Type 2 diabetes mellitus with moderate nonproliferative diabetic retinopathy with macular edema, bilateral: Secondary | ICD-10-CM | POA: Diagnosis not present

## 2023-11-14 DIAGNOSIS — Z794 Long term (current) use of insulin: Secondary | ICD-10-CM | POA: Diagnosis not present

## 2023-11-14 DIAGNOSIS — H40053 Ocular hypertension, bilateral: Secondary | ICD-10-CM

## 2023-11-14 DIAGNOSIS — Z7984 Long term (current) use of oral hypoglycemic drugs: Secondary | ICD-10-CM

## 2023-11-14 DIAGNOSIS — Z961 Presence of intraocular lens: Secondary | ICD-10-CM

## 2023-11-14 MED ORDER — BEVACIZUMAB CHEMO INJECTION 1.25MG/0.05ML SYRINGE FOR KALEIDOSCOPE
1.2500 mg | INTRAVITREAL | Status: AC | PRN
  Administered 2023-11-14: 1.25 mg via INTRAVITREAL

## 2023-12-07 ENCOUNTER — Emergency Department (HOSPITAL_COMMUNITY)

## 2023-12-07 ENCOUNTER — Emergency Department (HOSPITAL_COMMUNITY)
Admission: EM | Admit: 2023-12-07 | Discharge: 2023-12-07 | Disposition: A | Discharge status: Home / Self Care | Attending: Emergency Medicine | Admitting: Emergency Medicine

## 2023-12-07 ENCOUNTER — Encounter (HOSPITAL_COMMUNITY): Payer: Self-pay

## 2023-12-07 DIAGNOSIS — R0602 Shortness of breath: Secondary | ICD-10-CM | POA: Insufficient documentation

## 2023-12-07 DIAGNOSIS — Z794 Long term (current) use of insulin: Secondary | ICD-10-CM | POA: Insufficient documentation

## 2023-12-07 DIAGNOSIS — R600 Localized edema: Secondary | ICD-10-CM

## 2023-12-07 DIAGNOSIS — R2243 Localized swelling, mass and lump, lower limb, bilateral: Secondary | ICD-10-CM | POA: Diagnosis not present

## 2023-12-07 DIAGNOSIS — Z7982 Long term (current) use of aspirin: Secondary | ICD-10-CM | POA: Diagnosis not present

## 2023-12-07 LAB — BASIC METABOLIC PANEL WITH GFR
Anion gap: 10 (ref 5–15)
BUN: 13 mg/dL (ref 8–23)
CO2: 24 mmol/L (ref 22–32)
Calcium: 9.6 mg/dL (ref 8.9–10.3)
Chloride: 108 mmol/L (ref 98–111)
Creatinine, Ser: 1.11 mg/dL (ref 0.61–1.24)
GFR, Estimated: >60 mL/min (ref >60)
Glucose, Bld: 77 mg/dL (ref 70–99)
Potassium: 4.7 mmol/L (ref 3.5–5.1)
Sodium: 141 mmol/L (ref 135–145)

## 2023-12-07 LAB — CBC
HCT: 34.1 % — ABNORMAL LOW (ref 39.0–52.0)
Hemoglobin: 9.4 g/dL — ABNORMAL LOW (ref 13.0–17.0)
MCH: 23.3 pg — ABNORMAL LOW (ref 26.0–34.0)
MCHC: 27.6 g/dL — ABNORMAL LOW (ref 30.0–36.0)
MCV: 84.4 fL (ref 80.0–100.0)
Platelets: 329 K/uL (ref 150–400)
RBC: 4.04 MIL/uL — ABNORMAL LOW (ref 4.22–5.81)
RDW: 17.7 % — ABNORMAL HIGH (ref 11.5–15.5)
WBC: 8.6 K/uL (ref 4.0–10.5)
nRBC: 0 % (ref 0.0–0.2)

## 2023-12-07 LAB — HEPATIC FUNCTION PANEL
ALT: 10 U/L (ref 0–44)
AST: 18 U/L (ref 15–41)
Albumin: 3.5 g/dL (ref 3.5–5.0)
Alkaline Phosphatase: 67 U/L (ref 38–126)
Bilirubin, Direct: <0.1 mg/dL (ref 0.0–0.2)
Total Bilirubin: <0.2 mg/dL (ref 0.0–1.2)
Total Protein: 7 g/dL (ref 6.5–8.1)

## 2023-12-07 LAB — PRO BRAIN NATRIURETIC PEPTIDE: Pro Brain Natriuretic Peptide: 669 pg/mL — ABNORMAL HIGH (ref <300.0)

## 2023-12-07 LAB — D-DIMER, QUANTITATIVE: D-Dimer, Quant: 1.41 ug{FEU}/mL — ABNORMAL HIGH (ref 0.00–0.50)

## 2023-12-07 MED ORDER — FUROSEMIDE 20 MG PO TABS
40.0000 mg | ORAL_TABLET | Freq: Every day | ORAL | 0 refills | Status: AC
Start: 2023-12-07 — End: ?

## 2023-12-07 MED ORDER — FUROSEMIDE 10 MG/ML IJ SOLN
40.0000 mg | Freq: Once | INTRAMUSCULAR | Status: AC
  Administered 2023-12-07: 40 mg via INTRAVENOUS
  Filled 2023-12-07: qty 4

## 2023-12-07 NOTE — ED Triage Notes (Signed)
 Pt states that he has been having SOB and bilateral leg swelling for the past month. Pt also reports pain in his lower back that shoots down his R leg.

## 2023-12-07 NOTE — ED Provider Notes (Signed)
 [place] EMERGENCY DEPARTMENT AT [facility] Provider Note   CSN: [id number] Arrival date & time: 12/07/23  1945     Patient presents with: Shortness of Breath   Raymond Jha. is a 73 y.o. male.   HPI Patient presents with his wife who assists with the history. He presents with concern of lower extremity edema and pain as well as dyspnea and fatigue. Illness has been present for most of this year, worsening over the recent weeks. In particular, over the past few days patient has noticed bilateral pain in both thighs, swelling in both lower extremities, right greater than left.  Patient previously had left greater than right swelling.  There is no chest pain, but there is dyspnea, particular with exertion. He has seen his physician had echocardiogram, lower extremity ultrasound performed earlier in the year as well.    Prior to Admission medications   Medication Sig Start Date End Date Taking? Authorizing Provider  ACETAMINOPHEN  PO Take 650 mg by mouth as needed.    [provider]  albuterol  (VENTOLIN  HFA) 108 (90 Base) MCG/ACT inhaler Inhale 1-2 puffs into the lungs every 6 (six) hours as needed for wheezing or shortness of breath. 05/04/23   Laurice Maude BROCKS, MD  amLODipine-benazepril (LOTREL) 5-40 MG capsule Take 1 capsule by mouth daily.    [provider]  aspirin 81 MG chewable tablet Chew 81 mg by mouth daily.    [provider]  atorvastatin (LIPITOR) 20 MG tablet Take 20 mg by mouth daily.    [provider]  brimonidine (ALPHAGAN) 0.2 % ophthalmic solution 1 drop 2 (two) times daily. 09/07/23   [provider]  dorzolamide-timolol (COSOPT) 2-0.5 % ophthalmic solution 1 drop 2 (two) times daily. 09/19/23   [provider]  fexofenadine (ALLEGRA) 180 MG tablet Take 180 mg by mouth daily.    [provider]  furosemide  (LASIX ) 20 MG tablet Take 2 tablets (40 mg total) by mouth daily. 12/07/23   Garrick Charleston, MD  gabapentin  (NEURONTIN ) 300 MG capsule Take 300-600 mg by mouth 2 (two) times daily. 300mg  in the morning and 600mg  at bedtime    [provider]  gemfibrozil  (LOPID ) 600 MG tablet Take 600 mg by mouth 2 (two) times daily before a meal.  01/23/14   [provider]  glucose blood (FREESTYLE LITE) test strip Use as instructed 10/16/14   Tapia, Leisa, PA-C  hydrALAZINE  (APRESOLINE ) 25 MG tablet Take 25 mg by mouth at bedtime. 06/25/23   [provider]  insulin  glargine (LANTUS  SOLOSTAR) 100 UNIT/ML Solostar Pen Inject 40 Units into the skin at bedtime. 04/24/23   Lue Elsie BROCKS, MD  ketorolac  (ACULAR ) 0.5 % ophthalmic solution Place 1 drop into the right eye 2 (two) times daily. 09/01/23   [provider]  Lancets (FREESTYLE) lancets Use as instructed 10/16/14   Tapia, Leisa, PA-C  metFORMIN  (GLUCOPHAGE ) 1000 MG tablet Take 1 tablet (1,000 mg total) by mouth daily with breakfast. 10/16/14   Tapia, Leisa, PA-C  Omega 3 1200 MG CAPS Take 2,400 mg by mouth 2 (two) times daily.    [provider]  omeprazole  (PRILOSEC  OTC) 20 MG tablet Take 2 tablets (40 mg total) by mouth daily. 04/24/23   Lue Elsie BROCKS, MD  potassium chloride  SA (KLOR-CON  M) 20 MEQ tablet Take 20 mEq by mouth daily. 07/30/23   [provider]  vitamin C (ASCORBIC ACID) 500 MG tablet Take 500 mg by mouth daily.  [provider]    Allergies: Codeine, Ibuprofen, and Aspirin    Review of Systems  Updated Vital Signs BP (!) 191/90   Pulse 81   Temp 97.8 F (36.6 C) (Oral)   Resp 18   Ht 1.727 m (5' 8)   Wt 92.5 kg   SpO2 98%   BMI 31.02 kg/m   Physical Exam Vitals and nursing note reviewed.  Constitutional:      General: He is not in acute distress.    Appearance: He is well-developed.  HENT:     Head: Normocephalic and atraumatic.  Eyes:     Conjunctiva/sclera: Conjunctivae normal.  Cardiovascular:     Rate and Rhythm: Normal rate and  regular rhythm.  Pulmonary:     Effort: Pulmonary effort is normal. No respiratory distress.     Breath sounds: No stridor.  Abdominal:     General: There is no distension.  Musculoskeletal:     Right lower leg: Edema present.     Left lower leg: Edema present.  Skin:    General: Skin is warm and dry.  Neurological:     Mental Status: He is alert and oriented to person, place, and time.     (all labs ordered are listed, but only abnormal results are displayed) Labs Reviewed  CBC - Abnormal; Notable for the following components:      Result Value   RBC 4.04 (*)    Hemoglobin 9.4 (*)    HCT 34.1 (*)    MCH 23.3 (*)    MCHC 27.6 (*)    RDW 17.7 (*)    All other components within normal limits  PRO BRAIN NATRIURETIC PEPTIDE - Abnormal; Notable for the following components:   Pro Brain Natriuretic Peptide 669.0 (*)    All other components within normal limits  D-DIMER, QUANTITATIVE - Abnormal; Notable for the following components:   D-Dimer, Quant 1.41 (*)    All other components within normal limits  BASIC METABOLIC PANEL WITH GFR  HEPATIC FUNCTION PANEL    EKG: EKG Interpretation Date/Time:  Saturday December 07 2023 19:57:34 EDT Ventricular Rate:  78 PR Interval:  233 QRS Duration:  66 QT Interval:  399 QTC Calculation: 455 R Axis:   85  Text Interpretation: Sinus rhythm Prolonged PR interval Borderline right axis deviation Confirmed by Garrick Charleston [id number]) on 12/07/2023 9:34:48 PM  Radiology: [facility] Chest Port 1 View Result Date: 12/07/2023 CLINICAL DATA:  Shortness of breath, bilateral lower extremity edema EXAM: PORTABLE CHEST 1 VIEW COMPARISON:  05/04/2023 FINDINGS: Single frontal view of the chest demonstrates a stable enlarged cardiac silhouette. There is chronic elevation of the left hemidiaphragm with stable scarring or atelectasis at the left lung base. No acute airspace disease, effusion, or pneumothorax. No acute bony abnormalities. IMPRESSION: 1. Chronic  elevation of the left hemidiaphragm and left basilar consolidation or scarring. 2. No acute intrathoracic process. Electronically Signed   By: Ozell Daring M.D.   On: 12/07/2023 21:01     Procedures   Medications Ordered in the ED  furosemide  (LASIX ) injection 40 mg (40 mg Intravenous Given 12/07/23 2223)                                    Medical Decision Making Patient presents with bilateral lower extremity pain, swelling. Broad differential including venous insufficiency, hepatorenal dysfunction, heart failure, does echocardiogram earlier this year was unremarkable. He has appreciable  distal pulses, and sensation as well as motor function. All reassuring for lower suspicion for nerve or system disruption. Patient is hypertensive on arrival, labs ECG x-ray ordered. Cardiac 90 sinus normal pulse ox 97% room air normal  Amount and/or Complexity of Data Reviewed Independent Historian: spouse External Data Reviewed: notes.    Details: Echo largely unremarkable from earlier this year reviewed Labs: ordered. Decision-making details documented in ED Course. Radiology: ordered and independent interpretation performed. Decision-making details documented in ED Course. ECG/medicine tests: ordered and independent interpretation performed. Decision-making details documented in ED Course.  Risk Prescription drug management. Decision regarding hospitalization. Diagnosis or treatment significantly limited by social determinants of health.   10:27 PM Patient remains in no distress, mildly hypertensive, without increased work of breathing at rest. Patient has no chest pain, D-dimer is positive, patient will return for outpatient DVT study.  ECG without ischemia.  However, patient with slightly elevated BNP for age.  Suspicion for multifactorial fatigue/fatigue with exertion, including anemia, though his hemoglobin is stable since last value, and is low, venous insufficiency, possible mild heart  failure. The patient received additional IV diuretics beyond his baseline 20 mg, will have cardiology referral for follow-up in the coming days with consideration of repeat echocardiogram.  In addition, patient will return for repeat ultrasound of his lower extremities in 2 days as that study is not currently available. Patient, wife, and I had a lengthy conversation about all of this, patient discharged in stable condition.   Final diagnoses:  Shortness of breath  Bilateral lower extremity edema    ED Discharge Orders          Ordered    Ambulatory referral to Cardiology       Comments: If you have not heard from the Cardiology office within the next 72 hours please call [phone].   12/07/23 2227    LE Venous       Comments: IMPORTANT PATIENT INSTRUCTIONS: You have been scheduled for an Outpatient Vascular Study at [facility].  If tomorrow is a Saturday, Sunday or holiday, please go to the [facility] Registration Desk at 11 am tomorrow morning and tell them you are there for a vascular study.  If tomorrow is a weekday (Monday-Friday), please go to the [facility] (address [address], [place]) at 8 am and report to the 4th floor registration Zone A.  Inform registration that you are there for a vascular study.   12/07/23 2227    furosemide (LASIX) 20 MG tablet  Daily        09 /06/25 2227               Garrick Charleston, MD 12/07/23 2227

## 2023-12-07 NOTE — Discharge Instructions (Addendum)
 As discussed, today's evaluation has been somewhat reassuring.  Additional studies are required, as is follow-up with your cardiologist.  The cardiology clinic should call you on Monday for follow-up instructions.  In addition, on Monday you have a scheduled for outpatient ultrasound of your lower extremities.  Your Lasix  dosing has changed, please take your new dose of 40 mg, until you have seen your cardiologist.  Return here for concerning changes in your condition.

## 2023-12-10 ENCOUNTER — Ambulatory Visit (HOSPITAL_COMMUNITY)
Admission: RE | Admit: 2023-12-10 | Discharge: 2023-12-10 | Disposition: A | Discharge status: Home / Self Care | Source: Ambulatory Visit | Attending: Emergency Medicine | Admitting: Emergency Medicine

## 2023-12-10 DIAGNOSIS — R609 Edema, unspecified: Secondary | ICD-10-CM | POA: Insufficient documentation

## 2023-12-16 NOTE — Progress Notes (Signed)
 [facility] - Clinic Note  12/19/2023     CHIEF COMPLAINT Patient presents for Retina Follow Up    HISTORY OF PRESENT ILLNESS: Raymond Marro. is a 73 y.o. male who presents to the clinic today for:   HPI     Retina Follow Up   Patient presents with  Diabetic Retinopathy.  In both eyes.  This started 5 weeks ago.  I, the attending physician,  performed the HPI with the patient and updated documentation appropriately.        Comments   Patient here for 5 weeks retina follow up for NPDR OU. Patient states vision doing good, real good. No eye pain. Uses drops.      Last edited by Valdemar Rogue, MD on 12/19/2023  1:22 PM.     Pt states nothing new, some spikes in blood sugar. Didn't get much sleep last night  Referring physician: Leigh Lung, MD [address] [place],  [place] [address]  HISTORICAL INFORMATION:   Selected notes from the MEDICAL RECORD NUMBER Referred by Dr. Lung Leigh for DM exam LEE:  Ocular Hx- PMH-DM (on Janumet, Lantus  and Metformin ), HTN, arthritis    CURRENT MEDICATIONS: Current Outpatient Medications (Ophthalmic Drugs)  Medication Sig   brimonidine (ALPHAGAN) 0.2 % ophthalmic solution 1 drop 2 (two) times daily.   dorzolamide-timolol (COSOPT) 2-0.5 % ophthalmic solution 1 drop 2 (two) times daily.   ketorolac  (ACULAR ) 0.5 % ophthalmic solution Place 1 drop into the right eye 2 (two) times daily.   No current facility-administered medications for this visit. (Ophthalmic Drugs)   Current Outpatient Medications (Other)  Medication Sig   ACETAMINOPHEN  PO Take 650 mg by mouth as needed.   albuterol  (VENTOLIN  HFA) 108 (90 Base) MCG/ACT inhaler Inhale 1-2 puffs into the lungs every 6 (six) hours as needed for wheezing or shortness of breath.   amLODipine-benazepril (LOTREL) 5-40 MG capsule Take 1 capsule by mouth daily.   aspirin 81 MG chewable tablet Chew 81 mg by mouth daily.   atorvastatin (LIPITOR) 20 MG tablet  Take 20 mg by mouth daily.   fexofenadine (ALLEGRA) 180 MG tablet Take 180 mg by mouth daily.   furosemide  (LASIX ) 20 MG tablet Take 2 tablets (40 mg total) by mouth daily.   gabapentin  (NEURONTIN ) 300 MG capsule Take 300-600 mg by mouth 2 (two) times daily. 300mg  in the morning and 600mg  at bedtime   gemfibrozil  (LOPID ) 600 MG tablet Take 600 mg by mouth 2 (two) times daily before a meal.    glucose blood (FREESTYLE LITE) test strip Use as instructed   hydrALAZINE  (APRESOLINE ) 25 MG tablet Take 25 mg by mouth at bedtime.   insulin  glargine (LANTUS  SOLOSTAR) 100 UNIT/ML Solostar Pen Inject 40 Units into the skin at bedtime.   Lancets (FREESTYLE) lancets Use as instructed   metFORMIN  (GLUCOPHAGE ) 1000 MG tablet Take 1 tablet (1,000 mg total) by mouth daily with breakfast.   Omega 3 1200 MG CAPS Take 2,400 mg by mouth 2 (two) times daily.   omeprazole  (PRILOSEC  OTC) 20 MG tablet Take 2 tablets (40 mg total) by mouth daily.   potassium chloride  SA (KLOR-CON  M) 20 MEQ tablet Take 20 mEq by mouth daily.   vitamin C (ASCORBIC ACID) 500 MG tablet Take 500 mg by mouth daily.   No current facility-administered medications for this visit. (Other)   REVIEW OF SYSTEMS: ROS   Positive for: Endocrine, Eyes Negative for: Constitutional, Gastrointestinal, Neurological, Skin, Genitourinary,  Musculoskeletal, HENT, Cardiovascular, Respiratory, Psychiatric, Allergic/Imm, Heme/Lymph Last edited by Orval Asberry RAMAN, COA on 12/19/2023  8:28 AM.      ALLERGIES Allergies  Allergen Reactions   Codeine     unknown   Ibuprofen     REACTION: unspecified   Aspirin Nausea Only    Irritates stomach-can take 81 mg   PAST MEDICAL HISTORY Past Medical History:  Diagnosis Date   Anxiety    Arthritis    Chest pain    Diabetes mellitus without complication (HCC)    Type 2   Dizziness    DM (diabetes mellitus) (HCC)    ED (erectile dysfunction)    Enlarged heart    GERD (gastroesophageal reflux disease)     HLD (hyperlipidemia)    Hypertension    Localized swelling of both lower legs    Pleural effusion    Shortness of breath    Tachycardia    Past Surgical History:  Procedure Laterality Date   BIOPSY  04/24/2023   Procedure: BIOPSY;  Surgeon: Elicia Claw, MD;  Location: [facility];  Service: Gastroenterology;;   CATARACT EXTRACTION     COLONOSCOPY WITH PROPOFOL  N/A 04/24/2023   Procedure: COLONOSCOPY WITH PROPOFOL ;  Surgeon: Elicia Claw, MD;  Location: [facility];  Service: Gastroenterology;  Laterality: N/A;   ESOPHAGOGASTRODUODENOSCOPY (EGD) WITH PROPOFOL  N/A 04/24/2023   Procedure: ESOPHAGOGASTRODUODENOSCOPY (EGD) WITH PROPOFOL ;  Surgeon: Elicia Claw, MD;  Location: [facility];  Service: Gastroenterology;  Laterality: N/A;   KNEE SURGERY Left    POLYPECTOMY  04/24/2023   Procedure: POLYPECTOMY;  Surgeon: Elicia Claw, MD;  Location: [facility];  Service: Gastroenterology;;   RADIOLOGY WITH ANESTHESIA N/A 08/26/2014   Procedure: MRI LUMBAR SPINE   (RADIOLOGY WITH ANESTHESIA);  Surgeon: Medication Radiologist, MD;  Location: [facility];  Service: Radiology;  Laterality: N/A;   FAMILY HISTORY Family History  Problem Relation Age of Onset   Hypertension Mother    Diabetes type II Mother    Thyroid  disease Mother    SOCIAL HISTORY Social History   Tobacco Use   Smoking status: Never   Smokeless tobacco: Never  Vaping Use   Vaping status: Never Used  Substance Use Topics   Alcohol use: No   Drug use: No       OPHTHALMIC EXAM:   Base Eye Exam     Visual Acuity (Snellen - Linear)       Right Left   Dist [place] 20/25 +2 20/25 +2         Tonometry (Tonopen, 8:25 AM)       Right Left   Pressure 22 16         Pupils       Dark Light Shape React APD   Right 3 2 Round Brisk None   Left 3 2 Round Brisk None         Visual Fields (Counting fingers)       Left Right    Full Full         Extraocular Movement       Right Left     Full, Ortho Full, Ortho         Neuro/Psych     Oriented x3: Yes   Mood/Affect: Normal         Dilation     Both eyes: 1.0% Mydriacyl, 2.5% Phenylephrine @ 8:25 AM           Slit Lamp and Fundus Exam     Slit Lamp Exam  Right Left   Lids/Lashes Dermatochalasis - upper lid Dermatochalasis - upper lid   Conjunctiva/Sclera Nasal and temporal Pinguecula, mild melanosis Nasal and temporal Pinguecula, Melanosis   Cornea Arcus, tear film debris, mild EBMD superiorly, well healed cataract wound Arcus, well healed cataract wound   Anterior Chamber Deep,  deep and clear   Iris Round and dilated, No NVI Round and dilated, No NVI   Lens PC IOL in good position PC IOL in good position   Anterior Vitreous Vitreous syneresis Vitreous syneresis         Fundus Exam       Right Left   Disc Pink and Sharp, +cupping Mild pallor, sharp rim, +PPP, Thin superior rim, +cupping   C/D Ratio 0.6 0.75   Macula Good foveal reflex, central cystic changes -- slightly increased, rare MA Flat, Good foveal reflex, rare MA / DBH   Vessels attenuated, mild tortuosity attenuated, mild tortuosity   Periphery Attached, scattered MA / DBH greatest posteriorly Attached, no heme           Refraction     Wearing Rx       Sphere Cylinder Axis Add   Right [place] +0.50 093 +2.50   Left -0.25 Sphere  +2.50    Type: PAL           IMAGING AND PROCEDURES  Imaging and Procedures for @TODAY @  OCT, Retina - OU - Both Eyes       Right Eye Quality was good. Central Foveal Thickness: 328. Progression has worsened. Findings include normal foveal contour, no SRF, intraretinal fluid, vitreomacular adhesion (Persistent central cystic changes / DME -- slightly increased).   Left Eye Quality was good. Central Foveal Thickness: 265. Progression has worsened. Findings include normal foveal contour, no SRF, intraretinal fluid (Focal cystic changes nasal mac--slightly increased, Partial PVD).    Notes *Images captured and stored on drive  Diagnosis / Impression:  OD: persistent central cystic changes / DME -- slightly increased OS: Focal cystic changes nasal mac--slightly increased, Partial PVD  Clinical management:  See below  Abbreviations: NFP - Normal foveal profile. CME - cystoid macular edema. PED - pigment epithelial detachment. IRF - intraretinal fluid. SRF - subretinal fluid. EZ - ellipsoid zone. ERM - epiretinal membrane. ORA - outer retinal atrophy. ORT - outer retinal tubulation. SRHM - subretinal hyper-reflective material       Intravitreal Injection, Pharmacologic Agent - OD - Right Eye       Time Out 12/19/2023. 9:15 AM. Confirmed correct patient, procedure, site, and patient consented.   Anesthesia Topical anesthesia was used. Anesthetic medications included Lidocaine  2%, Proparacaine 0.5%.   Procedure Preparation included 5% betadine to ocular surface, eyelid speculum. A supplied (32g) needle was used.   Injection: 1.25 mg Bevacizumab  1.25mg /0.39ml   Route: Intravitreal, Site: Right Eye   NDC: [id number], Lot: [id number]$ , Expiration date: 01/03/2024   Post-op Post injection exam found visual acuity of at least counting fingers. The patient tolerated the procedure well. There were no complications. The patient received written and verbal post procedure care education.            ASSESSMENT/PLAN:    ICD-10-CM   1. Moderate nonproliferative diabetic retinopathy of both eyes with macular edema associated with type 2 diabetes mellitus (HCC)  E11.3313 OCT, Retina - OU - Both Eyes    Intravitreal Injection, Pharmacologic Agent - OD - Right Eye    Bevacizumab  (AVASTIN ) SOLN 1.25 mg    2. Current use of insulin  (HCC)  Z79.4     3. Long term (current) use of oral hypoglycemic drugs  Z79.84     4. Primary open angle glaucoma of both eyes, unspecified glaucoma stage  H40.1130     5. Bilateral ocular hypertension  H40.053     6. Pseudophakia,  both eyes  Z96.1      1-3. 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  - s/p IVA OD #1 (02.27.25), #2 (03.27.25), #3 (05.01.25), #4 (06.05.25), #5 (07.10.25), #6 (08.14.25)  - OS without DME  - last A1c was 8.4 on 01.20.25  - pt lost to follow up from May 2019-December 2022 and then from December 2022-January 2025 - OCT shows OD: persistent central cystic changes / DME -- slightly increased; OS: Focal cystic changes nasal mac--slightly increased, Partial PVD at 5 weeks - BCVA OU 20/25, decreased from 20/20 -- s/p CE IOL OU - discussed findings, prognosis, and treatment options - recommend IVA OD #7 (09.18.25), today for persistent DME with follow up back to 4 weeks - pt wishes to proceed with injection - RBA of procedure discussed, questions answered - IVA informed consent obtained and signed, 02.27.25 - see procedure note - Eylea approved, but pt owes 20% -- pt wishes to stay with Avastin   - f/u in 4 wks -- DFE/OCT, possible injection  4,5. POAG / Ocular hypertension OU  - IOP 20,23 - +cupping w/ rim thinning OU - denies family hx of glaucoma - on brimondine and Cosopt BID OU, discontinued timolol per Dr. Fleeta  6. Pseudophakia OU  - s/p CE/IOL OS (Dr. Fleeta, OS: 03.13.25; OD: 04.22.25)  - IOL in good position, doing well  - post op drops per Dr. Fleeta  - monitor   Ophthalmic Meds Ordered this visit:  Meds ordered this encounter  Medications   Bevacizumab  (AVASTIN ) SOLN 1.25 mg     Return in about 4 weeks (around 01/16/2024) for NPDR OU, DFE, OCT, Possible Injxn.  There are no Patient Instructions on file for this visit.   Explained the diagnoses, plan, and follow up with the patient and they expressed understanding.  Patient expressed understanding of the importance of proper follow up care.   This document serves as a record of services personally performed by Redell JUDITHANN Hans, MD, PhD. It was created on their behalf by Auston Muzzy, COMT. The creation of this  record is the provider's dictation and/or activities during the visit.  Electronically signed by: Auston Muzzy, COMT 12/19/23 4:49 PM   This document serves as a record of services personally performed by Redell JUDITHANN Hans, MD, PhD. It was created on their behalf by Almetta Pesa, an ophthalmic technician. The creation of this record is the provider's dictation and/or activities during the visit.    Electronically signed by: Almetta Pesa, OA, 12/19/23  4:49 PM   Redell JUDITHANN Hans, M.D., Ph.D. Diseases & Surgery of the Retina and Vitreous [facility]  I have reviewed the above documentation for accuracy and completeness, and I agree with the above. Redell JUDITHANN Hans, M.D., Ph.D. 12/19/23 4:50 PM   Abbreviations: M myopia (nearsighted); A astigmatism; H hyperopia (farsighted); P presbyopia; Mrx spectacle prescription;  CTL contact lenses; OD right eye; OS left eye; OU both eyes  XT exotropia; ET esotropia; PEK punctate epithelial keratitis; PEE punctate epithelial erosions; DES dry eye syndrome; MGD meibomian gland dysfunction; ATs artificial tears; PFAT's preservative free artificial tears; NSC nuclear sclerotic cataract; PSC posterior subcapsular cataract; ERM epi-retinal membrane; PVD posterior vitreous  detachment; RD retinal detachment; DM diabetes mellitus; DR diabetic retinopathy; NPDR non-proliferative diabetic retinopathy; PDR proliferative diabetic retinopathy; CSME clinically significant macular edema; DME diabetic macular edema; dbh dot blot hemorrhages; CWS cotton wool spot; POAG primary open angle glaucoma; C/D cup-to-disc ratio; HVF humphrey visual field; GVF goldmann visual field; OCT optical coherence tomography; IOP intraocular pressure; BRVO Branch retinal vein occlusion; CRVO central retinal vein occlusion; CRAO central retinal artery occlusion; BRAO branch retinal artery occlusion; RT retinal tear; SB scleral buckle; PPV pars plana vitrectomy; VH Vitreous  hemorrhage; PRP panretinal laser photocoagulation; IVK intravitreal kenalog; VMT vitreomacular traction; MH Macular hole;  NVD neovascularization of the disc; NVE neovascularization elsewhere; AREDS age related eye disease study; ARMD age related macular degeneration; POAG primary open angle glaucoma; EBMD epithelial/anterior basement membrane dystrophy; ACIOL anterior chamber intraocular lens; IOL intraocular lens; PCIOL posterior chamber intraocular lens; Phaco/IOL phacoemulsification with intraocular lens placement; PRK photorefractive keratectomy; LASIK laser assisted in situ keratomileusis; HTN hypertension; DM diabetes mellitus; COPD chronic obstructive pulmonary disease

## 2023-12-19 ENCOUNTER — Encounter (INDEPENDENT_AMBULATORY_CARE_PROVIDER_SITE_OTHER): Payer: Self-pay | Admitting: Ophthalmology

## 2023-12-19 ENCOUNTER — Ambulatory Visit (INDEPENDENT_AMBULATORY_CARE_PROVIDER_SITE_OTHER): Admitting: Ophthalmology

## 2023-12-19 DIAGNOSIS — Z7984 Long term (current) use of oral hypoglycemic drugs: Secondary | ICD-10-CM

## 2023-12-19 DIAGNOSIS — E113313 Type 2 diabetes mellitus with moderate nonproliferative diabetic retinopathy with macular edema, bilateral: Secondary | ICD-10-CM | POA: Diagnosis not present

## 2023-12-19 DIAGNOSIS — H40053 Ocular hypertension, bilateral: Secondary | ICD-10-CM

## 2023-12-19 DIAGNOSIS — Z794 Long term (current) use of insulin: Secondary | ICD-10-CM

## 2023-12-19 DIAGNOSIS — H401130 Primary open-angle glaucoma, bilateral, stage unspecified: Secondary | ICD-10-CM | POA: Diagnosis not present

## 2023-12-19 DIAGNOSIS — Z961 Presence of intraocular lens: Secondary | ICD-10-CM

## 2023-12-19 MED ORDER — BEVACIZUMAB CHEMO INJECTION 1.25MG/0.05ML SYRINGE FOR KALEIDOSCOPE
1.2500 mg | INTRAVITREAL | Status: AC | PRN
  Administered 2023-12-19: 1.25 mg via INTRAVITREAL

## 2023-12-31 ENCOUNTER — Ambulatory Visit: Admitting: Cardiology

## 2024-01-13 NOTE — Progress Notes (Signed)
 [facility] - Clinic Note  01/16/2024     CHIEF COMPLAINT Patient presents for Retina Follow Up    HISTORY OF PRESENT ILLNESS: Raymond Munoz. is a 73 y.o. male who presents to the clinic today for:   HPI     Retina Follow Up   Patient presents with  Diabetic Retinopathy.  In both eyes.  This started 4 weeks ago.  I, the attending physician,  performed the HPI with the patient and updated documentation appropriately.        Comments   Patient here for 4 weeks retina follow up for NPDR OU. Patient states vision doing good. No eye pain. Using drops.       Last edited by Valdemar Rogue, MD on 01/16/2024 10:25 AM.    Pt states he's doing well, no changes.   Referring physician: Leigh Lung, MD [address] [place],  [place] [address]  HISTORICAL INFORMATION:   Selected notes from the MEDICAL RECORD NUMBER Referred by Dr. Lung Leigh for DM exam LEE:  Ocular Hx- PMH-DM (on Janumet, Lantus  and Metformin ), HTN, arthritis    CURRENT MEDICATIONS: Current Outpatient Medications (Ophthalmic Drugs)  Medication Sig   brimonidine (ALPHAGAN) 0.2 % ophthalmic solution 1 drop 2 (two) times daily.   dorzolamide-timolol (COSOPT) 2-0.5 % ophthalmic solution 1 drop 2 (two) times daily.   ketorolac  (ACULAR ) 0.5 % ophthalmic solution Place 1 drop into the right eye 2 (two) times daily.   No current facility-administered medications for this visit. (Ophthalmic Drugs)   Current Outpatient Medications (Other)  Medication Sig   ACETAMINOPHEN  PO Take 650 mg by mouth as needed.   albuterol  (VENTOLIN  HFA) 108 (90 Base) MCG/ACT inhaler Inhale 1-2 puffs into the lungs every 6 (six) hours as needed for wheezing or shortness of breath.   amLODipine-benazepril (LOTREL) 5-40 MG capsule Take 1 capsule by mouth daily.   aspirin 81 MG chewable tablet Chew 81 mg by mouth daily.   atorvastatin (LIPITOR) 20 MG tablet Take 20 mg by mouth daily.   fexofenadine (ALLEGRA) 180  MG tablet Take 180 mg by mouth daily.   furosemide  (LASIX ) 20 MG tablet Take 2 tablets (40 mg total) by mouth daily.   gabapentin  (NEURONTIN ) 300 MG capsule Take 300-600 mg by mouth 2 (two) times daily. 300mg  in the morning and 600mg  at bedtime   gemfibrozil  (LOPID ) 600 MG tablet Take 600 mg by mouth 2 (two) times daily before a meal.    glucose blood (FREESTYLE LITE) test strip Use as instructed   hydrALAZINE  (APRESOLINE ) 25 MG tablet Take 25 mg by mouth at bedtime.   insulin  glargine (LANTUS  SOLOSTAR) 100 UNIT/ML Solostar Pen Inject 40 Units into the skin at bedtime.   Lancets (FREESTYLE) lancets Use as instructed   metFORMIN  (GLUCOPHAGE ) 1000 MG tablet Take 1 tablet (1,000 mg total) by mouth daily with breakfast.   Omega 3 1200 MG CAPS Take 2,400 mg by mouth 2 (two) times daily.   omeprazole  (PRILOSEC  OTC) 20 MG tablet Take 2 tablets (40 mg total) by mouth daily.   potassium chloride  SA (KLOR-CON  M) 20 MEQ tablet Take 20 mEq by mouth daily.   vitamin C (ASCORBIC ACID) 500 MG tablet Take 500 mg by mouth daily.   No current facility-administered medications for this visit. (Other)   REVIEW OF SYSTEMS: ROS   Positive for: Endocrine, Eyes Negative for: Constitutional, Gastrointestinal, Neurological, Skin, Genitourinary, Musculoskeletal, HENT, Cardiovascular, Respiratory, Psychiatric, Allergic/Imm, Heme/Lymph Last edited by  Orval Asberry RAMAN, COA on 01/16/2024  8:21 AM.       ALLERGIES Allergies  Allergen Reactions   Codeine     unknown   Ibuprofen     REACTION: unspecified   Aspirin Nausea Only    Irritates stomach-can take 81 mg   PAST MEDICAL HISTORY Past Medical History:  Diagnosis Date   Anxiety    Arthritis    Chest pain    Diabetes mellitus without complication (HCC)    Type 2   Dizziness    DM (diabetes mellitus) (HCC)    ED (erectile dysfunction)    Enlarged heart    GERD (gastroesophageal reflux disease)    HLD (hyperlipidemia)    Hypertension    Localized  swelling of both lower legs    Pleural effusion    Shortness of breath    Tachycardia    Past Surgical History:  Procedure Laterality Date   BIOPSY  04/24/2023   Procedure: BIOPSY;  Surgeon: Elicia Claw, MD;  Location: [facility];  Service: Gastroenterology;;   CATARACT EXTRACTION     COLONOSCOPY WITH PROPOFOL  N/A 04/24/2023   Procedure: COLONOSCOPY WITH PROPOFOL ;  Surgeon: Elicia Claw, MD;  Location: [facility];  Service: Gastroenterology;  Laterality: N/A;   ESOPHAGOGASTRODUODENOSCOPY (EGD) WITH PROPOFOL  N/A 04/24/2023   Procedure: ESOPHAGOGASTRODUODENOSCOPY (EGD) WITH PROPOFOL ;  Surgeon: Elicia Claw, MD;  Location: [facility];  Service: Gastroenterology;  Laterality: N/A;   KNEE SURGERY Left    POLYPECTOMY  04/24/2023   Procedure: POLYPECTOMY;  Surgeon: Elicia Claw, MD;  Location: [facility];  Service: Gastroenterology;;   RADIOLOGY WITH ANESTHESIA N/A 08/26/2014   Procedure: MRI LUMBAR SPINE   (RADIOLOGY WITH ANESTHESIA);  Surgeon: Medication Radiologist, MD;  Location: [facility];  Service: Radiology;  Laterality: N/A;   FAMILY HISTORY Family History  Problem Relation Age of Onset   Hypertension Mother    Diabetes type II Mother    Thyroid  disease Mother    SOCIAL HISTORY Social History   Tobacco Use   Smoking status: Never   Smokeless tobacco: Never  Vaping Use   Vaping status: Never Used  Substance Use Topics   Alcohol use: No   Drug use: No       OPHTHALMIC EXAM:   Base Eye Exam     Visual Acuity (Snellen - Linear)       Right Left   Dist [place] 20/25 +2 20/25 -2         Tonometry (Tonopen, 8:19 AM)       Right Left   Pressure 18 20         Pupils       Dark Light Shape React APD   Right 3 2 Round Brisk None   Left 3 2 Round Brisk None         Visual Fields (Counting fingers)       Left Right    Full Full         Extraocular Movement       Right Left    Full, Ortho Full, Ortho         Neuro/Psych      Oriented x3: Yes   Mood/Affect: Normal         Dilation     Both eyes: 1.0% Mydriacyl, 2.5% Phenylephrine @ 8:19 AM           Slit Lamp and Fundus Exam     Slit Lamp Exam       Right Left  Lids/Lashes Dermatochalasis - upper lid Dermatochalasis - upper lid   Conjunctiva/Sclera Nasal and temporal Pinguecula, mild melanosis Nasal and temporal Pinguecula, Melanosis   Cornea Arcus, tear film debris, mild EBMD superiorly, well healed cataract wound Arcus, well healed cataract wound   Anterior Chamber Deep,  deep and clear   Iris Round and dilated, No NVI Round and dilated, No NVI   Lens PC IOL in good position PC IOL in good position   Anterior Vitreous Vitreous syneresis Vitreous syneresis         Fundus Exam       Right Left   Disc Pink and Sharp, +cupping Mild pallor, sharp rim, +PPP, Thin superior rim, +cupping   C/D Ratio 0.6 0.75   Macula Good foveal reflex, central cystic changes -- slightly improved, rare MA Flat, Good foveal reflex, rare MA / DBH   Vessels attenuated, mild tortuosity attenuated, mild tortuosity   Periphery Attached, scattered MA / DBH greatest posteriorly Attached, focal DBH sup to disc           IMAGING AND PROCEDURES  Imaging and Procedures for @TODAY @  OCT, Retina - OU - Both Eyes       Right Eye Quality was good. Central Foveal Thickness: 315. Progression has improved. Findings include normal foveal contour, no SRF, intraretinal fluid, vitreomacular adhesion (Persistent central cystic changes / DME -- slightly improved).   Left Eye Quality was good. Central Foveal Thickness: 260. Progression has improved. Findings include normal foveal contour, no SRF, intraretinal fluid (Focal cystic changes nasal mac--slightly improved, Partial PVD).   Notes *Images captured and stored on drive  Diagnosis / Impression:  OD: persistent central cystic changes / DME -- slightly improved OS: Focal cystic changes nasal mac--slightly improved,  Partial PVD  Clinical management:  See below  Abbreviations: NFP - Normal foveal profile. CME - cystoid macular edema. PED - pigment epithelial detachment. IRF - intraretinal fluid. SRF - subretinal fluid. EZ - ellipsoid zone. ERM - epiretinal membrane. ORA - outer retinal atrophy. ORT - outer retinal tubulation. SRHM - subretinal hyper-reflective material       Intravitreal Injection, Pharmacologic Agent - OD - Right Eye       Time Out 01/16/2024. 8:17 AM. Confirmed correct patient, procedure, site, and patient consented.   Anesthesia Topical anesthesia was used. Anesthetic medications included Lidocaine  2%, Proparacaine 0.5%.   Procedure Preparation included 5% betadine to ocular surface, eyelid speculum. A supplied (32g) needle was used.   Injection: 1.25 mg Bevacizumab  1.25mg /0.44ml   Route: Intravitreal, Site: Right Eye   NDC: [id number], Lot: [id number], Expiration date: 02/02/2024   Post-op Post injection exam found visual acuity of at least counting fingers. The patient tolerated the procedure well. There were no complications. The patient received written and verbal post procedure care education.             ASSESSMENT/PLAN:    ICD-10-CM   1. Moderate nonproliferative diabetic retinopathy of both eyes with macular edema associated with type 2 diabetes mellitus (HCC)  E11.3313 OCT, Retina - OU - Both Eyes    Intravitreal Injection, Pharmacologic Agent - OD - Right Eye    Bevacizumab  (AVASTIN ) SOLN 1.25 mg    2. Current use of insulin  (HCC)  Z79.4     3. Long term (current) use of oral hypoglycemic drugs  Z79.84     4. Primary open angle glaucoma of both eyes, unspecified glaucoma stage  H40.1130     5. Bilateral ocular hypertension  H40.053  6. Pseudophakia, both eyes  Z96.1       1-3. 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  - s/p IVA OD #1 (02.27.25), #2 (03.27.25), #3 (05.01.25), #4 (06.05.25), #5 (07.10.25), #6 (08.14.25), #7  (09.18.25)  - OS without DME  - last A1c was 8.4 on 01.20.25  - pt lost to follow up from May 2019-December 2022 and then from December 2022-January 2025 - OCT shows OD: persistent central cystic changes / DME -- slightly improved; OS: Focal cystic changes nasal mac--slightly increased, Partial PVD at 4 weeks - BCVA OU 20/25, decreased from 20/20 -- s/p CE IOL OU - recommend IVA OD #8 today (10.16.25) with follow up in 4 weeks - pt wishes to proceed with injection - RBA of procedure discussed, questions answered - IVA informed consent obtained and signed, 02.27.25 - see procedure note - Eylea approved, but pt owes 20% -- pt wishes to stay with Avastin   - f/u in 4 wks -- DFE/OCT, possible injection  4,5. POAG / Ocular hypertension OU  - IOP 18,20 - +cupping w/ rim thinning OU - denies family hx of glaucoma - on brimonidine and cosopt bid OU, discontinued timolol per Dr. Fleeta  6. Pseudophakia OU  - s/p CE/IOL OS (Dr. Fleeta, OS: 03.13.25; OD: 04.22.25)  - IOL in good position, doing well  - post op drops per Dr. Fleeta  - monitor   Ophthalmic Meds Ordered this visit:  Meds ordered this encounter  Medications   Bevacizumab  (AVASTIN ) SOLN 1.25 mg     Return in about 4 weeks (around 02/13/2024) for exu ARMD OD, DFE, OCT, Possible Injxn.  There are no Patient Instructions on file for this visit.   Explained the diagnoses, plan, and follow up with the patient and they expressed understanding.  Patient expressed understanding of the importance of proper follow up care.   This document serves as a record of services personally performed by Redell JUDITHANN Hans, MD, PhD. It was created on their behalf by Auston Muzzy, COMT. The creation of this record is the provider's dictation and/or activities during the visit.  Electronically signed by: Auston Muzzy, COMT 01/16/24 10:26 AM  This document serves as a record of services personally performed by Redell JUDITHANN Hans, MD, PhD. It was created on their  behalf by Almetta Pesa, an ophthalmic technician. The creation of this record is the provider's dictation and/or activities during the visit.    Electronically signed by: Almetta Pesa, OA, 01/16/24  10:26 AM   Redell JUDITHANN Hans, M.D., Ph.D. Diseases & Surgery of the Retina and Vitreous [facility]  I have reviewed the above documentation for accuracy and completeness, and I agree with the above. Redell JUDITHANN Hans, M.D., Ph.D. 01/16/24 10:27 AM   Abbreviations: M myopia (nearsighted); A astigmatism; H hyperopia (farsighted); P presbyopia; Mrx spectacle prescription;  CTL contact lenses; OD right eye; OS left eye; OU both eyes  XT exotropia; ET esotropia; PEK punctate epithelial keratitis; PEE punctate epithelial erosions; DES dry eye syndrome; MGD meibomian gland dysfunction; ATs artificial tears; PFAT's preservative free artificial tears; NSC nuclear sclerotic cataract; PSC posterior subcapsular cataract; ERM epi-retinal membrane; PVD posterior vitreous detachment; RD retinal detachment; DM diabetes mellitus; DR diabetic retinopathy; NPDR non-proliferative diabetic retinopathy; PDR proliferative diabetic retinopathy; CSME clinically significant macular edema; DME diabetic macular edema; dbh dot blot hemorrhages; CWS cotton wool spot; POAG primary open angle glaucoma; C/D cup-to-disc ratio; HVF humphrey visual field; GVF goldmann visual field; OCT optical coherence tomography;  IOP intraocular pressure; BRVO Branch retinal vein occlusion; CRVO central retinal vein occlusion; CRAO central retinal artery occlusion; BRAO branch retinal artery occlusion; RT retinal tear; SB scleral buckle; PPV pars plana vitrectomy; VH Vitreous hemorrhage; PRP panretinal laser photocoagulation; IVK intravitreal kenalog; VMT vitreomacular traction; MH Macular hole;  NVD neovascularization of the disc; NVE neovascularization elsewhere; AREDS age related eye disease study; ARMD age related macular  degeneration; POAG primary open angle glaucoma; EBMD epithelial/anterior basement membrane dystrophy; ACIOL anterior chamber intraocular lens; IOL intraocular lens; PCIOL posterior chamber intraocular lens; Phaco/IOL phacoemulsification with intraocular lens placement; PRK photorefractive keratectomy; LASIK laser assisted in situ keratomileusis; HTN hypertension; DM diabetes mellitus; COPD chronic obstructive pulmonary disease

## 2024-01-16 ENCOUNTER — Encounter (INDEPENDENT_AMBULATORY_CARE_PROVIDER_SITE_OTHER): Payer: Self-pay | Admitting: Ophthalmology

## 2024-01-16 ENCOUNTER — Ambulatory Visit (INDEPENDENT_AMBULATORY_CARE_PROVIDER_SITE_OTHER): Admitting: Ophthalmology

## 2024-01-16 DIAGNOSIS — H401130 Primary open-angle glaucoma, bilateral, stage unspecified: Secondary | ICD-10-CM | POA: Diagnosis not present

## 2024-01-16 DIAGNOSIS — Z961 Presence of intraocular lens: Secondary | ICD-10-CM

## 2024-01-16 DIAGNOSIS — E113313 Type 2 diabetes mellitus with moderate nonproliferative diabetic retinopathy with macular edema, bilateral: Secondary | ICD-10-CM | POA: Diagnosis not present

## 2024-01-16 DIAGNOSIS — Z794 Long term (current) use of insulin: Secondary | ICD-10-CM

## 2024-01-16 DIAGNOSIS — Z7984 Long term (current) use of oral hypoglycemic drugs: Secondary | ICD-10-CM | POA: Diagnosis not present

## 2024-01-16 DIAGNOSIS — H40053 Ocular hypertension, bilateral: Secondary | ICD-10-CM

## 2024-01-16 MED ORDER — BEVACIZUMAB CHEMO INJECTION 1.25MG/0.05ML SYRINGE FOR KALEIDOSCOPE
1.2500 mg | INTRAVITREAL | Status: AC | PRN
  Administered 2024-01-16: 1.25 mg via INTRAVITREAL

## 2024-02-11 NOTE — Progress Notes (Signed)
 [facility] - Clinic Note  02/13/2024     CHIEF COMPLAINT Patient presents for Retina Follow Up    HISTORY OF PRESENT ILLNESS: Raymond Munoz. is a 73 y.o. male who presents to the clinic today for:   HPI     Retina Follow Up   Patient presents with  Wet AMD.  In both eyes.  This started 4 weeks ago.  Duration of 4 weeks.  Since onset it is stable.  I, the attending physician,  performed the HPI with the patient and updated documentation appropriately.        Comments   4 week retina follow up NPDR OU and IVA OD pt is reporting no vision changes noticed he denies any flashes or floaters pt last reading 79 this ampt has COAG and has not used gtts this am  brimonidine and cosopt bid OU and ketorolac  OD at bedtime       Last edited by Valdemar Rogue, MD on 02/13/2024 10:53 AM.    Pt states his vision is doing well.   Referring physician: Leigh Lung, MD [address] [place],  [place] [address]  HISTORICAL INFORMATION:   Selected notes from the MEDICAL RECORD NUMBER Referred by Dr. Lung Leigh for DM exam LEE:  Ocular Hx- PMH-DM (on Janumet, Lantus  and Metformin ), HTN, arthritis    CURRENT MEDICATIONS: Current Outpatient Medications (Ophthalmic Drugs)  Medication Sig   brimonidine (ALPHAGAN) 0.2 % ophthalmic solution 1 drop 2 (two) times daily.   dorzolamide-timolol (COSOPT) 2-0.5 % ophthalmic solution 1 drop 2 (two) times daily.   ketorolac  (ACULAR ) 0.5 % ophthalmic solution Place 1 drop into the right eye 2 (two) times daily.   No current facility-administered medications for this visit. (Ophthalmic Drugs)   Current Outpatient Medications (Other)  Medication Sig   ACETAMINOPHEN  PO Take 650 mg by mouth as needed.   albuterol  (VENTOLIN  HFA) 108 (90 Base) MCG/ACT inhaler Inhale 1-2 puffs into the lungs every 6 (six) hours as needed for wheezing or shortness of breath.   amLODipine-benazepril (LOTREL) 5-40 MG capsule Take 1 capsule by  mouth daily.   aspirin 81 MG chewable tablet Chew 81 mg by mouth daily.   atorvastatin (LIPITOR) 20 MG tablet Take 20 mg by mouth daily.   fexofenadine (ALLEGRA) 180 MG tablet Take 180 mg by mouth daily.   furosemide  (LASIX ) 20 MG tablet Take 2 tablets (40 mg total) by mouth daily.   gabapentin  (NEURONTIN ) 300 MG capsule Take 300-600 mg by mouth 2 (two) times daily. 300mg  in the morning and 600mg  at bedtime   gemfibrozil  (LOPID ) 600 MG tablet Take 600 mg by mouth 2 (two) times daily before a meal.    glucose blood (FREESTYLE LITE) test strip Use as instructed   hydrALAZINE  (APRESOLINE ) 25 MG tablet Take 25 mg by mouth at bedtime.   insulin  glargine (LANTUS  SOLOSTAR) 100 UNIT/ML Solostar Pen Inject 40 Units into the skin at bedtime.   Lancets (FREESTYLE) lancets Use as instructed   metFORMIN  (GLUCOPHAGE ) 1000 MG tablet Take 1 tablet (1,000 mg total) by mouth daily with breakfast.   Omega 3 1200 MG CAPS Take 2,400 mg by mouth 2 (two) times daily.   omeprazole  (PRILOSEC  OTC) 20 MG tablet Take 2 tablets (40 mg total) by mouth daily.   potassium chloride  SA (KLOR-CON  M) 20 MEQ tablet Take 20 mEq by mouth daily.   vitamin C (ASCORBIC ACID) 500 MG tablet Take 500 mg by mouth daily.  No current facility-administered medications for this visit. (Other)   REVIEW OF SYSTEMS: ROS   Positive for: Endocrine, Eyes Negative for: Constitutional, Gastrointestinal, Neurological, Skin, Genitourinary, Musculoskeletal, HENT, Cardiovascular, Respiratory, Psychiatric, Allergic/Imm, Heme/Lymph Last edited by Resa Delon ORN, COT on 02/13/2024  8:48 AM.     ALLERGIES Allergies  Allergen Reactions   Codeine     unknown   Ibuprofen     REACTION: unspecified   Aspirin Nausea Only    Irritates stomach-can take 81 mg   PAST MEDICAL HISTORY Past Medical History:  Diagnosis Date   Anxiety    Arthritis    Chest pain    Diabetes mellitus without complication (HCC)    Type 2   Dizziness    DM  (diabetes mellitus) (HCC)    ED (erectile dysfunction)    Enlarged heart    GERD (gastroesophageal reflux disease)    HLD (hyperlipidemia)    Hypertension    Localized swelling of both lower legs    Pleural effusion    Shortness of breath    Tachycardia    Past Surgical History:  Procedure Laterality Date   BIOPSY  04/24/2023   Procedure: BIOPSY;  Surgeon: Elicia Claw, MD;  Location: [facility];  Service: Gastroenterology;;   CATARACT EXTRACTION     COLONOSCOPY WITH PROPOFOL  N/A 04/24/2023   Procedure: COLONOSCOPY WITH PROPOFOL ;  Surgeon: Elicia Claw, MD;  Location: [facility];  Service: Gastroenterology;  Laterality: N/A;   ESOPHAGOGASTRODUODENOSCOPY (EGD) WITH PROPOFOL  N/A 04/24/2023   Procedure: ESOPHAGOGASTRODUODENOSCOPY (EGD) WITH PROPOFOL ;  Surgeon: Elicia Claw, MD;  Location: [facility];  Service: Gastroenterology;  Laterality: N/A;   KNEE SURGERY Left    POLYPECTOMY  04/24/2023   Procedure: POLYPECTOMY;  Surgeon: Elicia Claw, MD;  Location: [facility];  Service: Gastroenterology;;   RADIOLOGY WITH ANESTHESIA N/A 08/26/2014   Procedure: MRI LUMBAR SPINE   (RADIOLOGY WITH ANESTHESIA);  Surgeon: Medication Radiologist, MD;  Location: [facility];  Service: Radiology;  Laterality: N/A;   FAMILY HISTORY Family History  Problem Relation Age of Onset   Hypertension Mother    Diabetes type II Mother    Thyroid  disease Mother    SOCIAL HISTORY Social History   Tobacco Use   Smoking status: Never   Smokeless tobacco: Never  Vaping Use   Vaping status: Never Used  Substance Use Topics   Alcohol use: No   Drug use: No       OPHTHALMIC EXAM:   Base Eye Exam     Visual Acuity (Snellen - Linear)       Right Left   Dist [place] 20/25 -2 20/30 -2   Dist ph [place] 20/25 -2         Tonometry (Tonopen, 8:56 AM)       Right Left   Pressure 29 36         Tonometry #2 (Tonopen, 9:38 AM)       Right Left   Pressure 13 21          Tonometry Comments   I gtts brom cosopt ou @856         Pupils       Pupils Dark Light Shape React APD   Right PERRL 3 2 Round Brisk None   Left PERRL 3 2 Round Brisk None         Visual Fields       Left Right    Full Full         Extraocular Movement  Right Left    Full, Ortho Full, Ortho         Neuro/Psych     Oriented x3: Yes   Mood/Affect: Normal           Slit Lamp and Fundus Exam     Slit Lamp Exam       Right Left   Lids/Lashes Dermatochalasis - upper lid Dermatochalasis - upper lid   Conjunctiva/Sclera Nasal and temporal Pinguecula, mild melanosis Nasal and temporal Pinguecula, Melanosis   Cornea Arcus, tear film debris, mild EBMD superiorly, well healed cataract wound Arcus, well healed cataract wound   Anterior Chamber Deep,  deep and clear   Iris Round and dilated, No NVI Round and dilated, No NVI   Lens PC IOL in good position PC IOL in good position   Anterior Vitreous Vitreous syneresis Vitreous syneresis         Fundus Exam       Right Left   Disc Pink and Sharp, +cupping Mild pallor, sharp rim, +PPP, Thin superior rim, +cupping   C/D Ratio 0.6 0.75   Macula Good foveal reflex, central cystic changes -- persistent, rare MA Flat, Good foveal reflex, rare MA / DBH   Vessels attenuated, mild tortuosity attenuated, mild tortuosity   Periphery Attached, scattered MA / DBH greatest posteriorly Attached, focal DBH sup to disc           IMAGING AND PROCEDURES  Imaging and Procedures for @TODAY @  OCT, Retina - OU - Both Eyes       Right Eye Quality was good. Central Foveal Thickness: 329. Progression has been stable. Findings include normal foveal contour, no SRF, intraretinal fluid, vitreomacular adhesion (Persistent central cystic changes / DME ).   Left Eye Quality was good. Central Foveal Thickness: 264. Progression has improved. Findings include normal foveal contour, no SRF, intraretinal fluid (Focal cystic changes  nasal mac, Partial PVD).   Notes *Images captured and stored on drive  Diagnosis / Impression:  OD: persistent central cystic changes / DME  OS: Focal cystic changes nasal mac--slightly improved, Partial PVD  Clinical management:  See below  Abbreviations: NFP - Normal foveal profile. CME - cystoid macular edema. PED - pigment epithelial detachment. IRF - intraretinal fluid. SRF - subretinal fluid. EZ - ellipsoid zone. ERM - epiretinal membrane. ORA - outer retinal atrophy. ORT - outer retinal tubulation. SRHM - subretinal hyper-reflective material       Intravitreal Injection, Pharmacologic Agent - OD - Right Eye       Time Out 02/13/2024. 9:00 AM. Confirmed correct patient, procedure, site, and patient consented.   Anesthesia Topical anesthesia was used. Anesthetic medications included Lidocaine  2%, Proparacaine 0.5%.   Procedure Preparation included 5% betadine to ocular surface, eyelid speculum. A supplied (32g) needle was used.   Injection: 1.25 mg Bevacizumab  1.25mg /0.10ml   Route: Intravitreal, Site: Right Eye   NDC: [id number], Lot: [id number]$ , Expiration date: 02/28/2024   Post-op Post injection exam found visual acuity of at least counting fingers. The patient tolerated the procedure well. There were no complications. The patient received written and verbal post procedure care education.            ASSESSMENT/PLAN:    ICD-10-CM   1. Moderate nonproliferative diabetic retinopathy of both eyes with macular edema associated with type 2 diabetes mellitus (HCC)  E11.3313 OCT, Retina - OU - Both Eyes    Intravitreal Injection, Pharmacologic Agent - OD - Right Eye    Bevacizumab  (AVASTIN ) SOLN 1.25 mg  2. Current use of insulin  (HCC)  Z79.4     3. Long term (current) use of oral hypoglycemic drugs  Z79.84     4. Primary open angle glaucoma of both eyes, unspecified glaucoma stage  H40.1130     5. Bilateral ocular hypertension  H40.053     6.  Pseudophakia, both eyes  Z96.1      1-3. 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 - s/p IVA OD #1 (02.27.25), #2 (03.27.25), #3 (05.01.25), #4 (06.05.25), #5 (07.10.25), #6 (08.14.25), #7 (09.18.25), #8 (10.16.25)  - OS without DME  - last A1c was 8.4 on 01.20.25 - pt lost to follow up from May 2019-December 2022 and then from December 2022-January 2025 - OCT shows OD: persistent central cystic changes / DME; OS: Focal cystic changes nasal mac--slightly increased, Partial PVD at 4 weeks - BCVA 20/25 OU - stable - recommend IVA OD #9 today (11.13.25) with follow up in 4 weeks - pt wishes to proceed with injection - RBA of procedure discussed, questions answered - IVA informed consent obtained and signed, 02.27.25 - see procedure note - Eylea approved, but pt owes 20% -- pt wishes to stay with Avastin   - f/u in 4 wks -- DFE/OCT, possible injection  4,5. POAG / Ocular hypertension OU  - IOP  - +cupping w/ rim thinning OU - denies family hx of glaucoma - on Brimonidine and Cosopt bid OU, discontinued Timolol per Dr. Fleeta  6. Pseudophakia OU  - s/p CE/IOL OS (Dr. Fleeta, OS: 03.13.25; OD: 04.22.25)  - IOL in good position, doing well  - post op drops per Dr. Fleeta  - monitor  Ophthalmic Meds Ordered this visit:  Meds ordered this encounter  Medications   Bevacizumab  (AVASTIN ) SOLN 1.25 mg     Return in about 4 weeks (around 03/12/2024) for f/u, NPDR, DFE, OCT, Possible, IVA, OD.  There are no Patient Instructions on file for this visit.   Explained the diagnoses, plan, and follow up with the patient and they expressed understanding.  Patient expressed understanding of the importance of proper follow up care.   This document serves as a record of services personally performed by Redell JUDITHANN Hans, MD, PhD. It was created on their behalf by Auston Muzzy, COMT. The creation of this record is the provider's dictation and/or activities during the visit.  Electronically  signed by: Auston Muzzy, COMT 02/13/24 10:58 AM  This document serves as a record of services personally performed by Redell JUDITHANN Hans, MD, PhD. It was created on their behalf by Almetta Pesa, an ophthalmic technician. The creation of this record is the provider's dictation and/or activities during the visit.    Electronically signed by: Almetta Pesa, OA, 02/13/24  10:58 AM  This document serves as a record of services personally performed by Redell JUDITHANN Hans, MD, PhD. It was created on their behalf by Wanda GEANNIE Keens, COT an ophthalmic technician. The creation of this record is the provider's dictation and/or activities during the visit.    Electronically signed by:  Wanda GEANNIE Keens, COT  02/13/24 10:58 AM  Redell JUDITHANN Hans, M.D., Ph.D. Diseases & Surgery of the Retina and Vitreous [facility]  I have reviewed the above documentation for accuracy and completeness, and I agree with the above. Redell JUDITHANN Hans, M.D., Ph.D. 02/13/24 10:59 AM   Abbreviations: M myopia (nearsighted); A astigmatism; H hyperopia (farsighted); P presbyopia; Mrx spectacle prescription;  CTL contact lenses; OD right eye; OS left  eye; OU both eyes  XT exotropia; ET esotropia; PEK punctate epithelial keratitis; PEE punctate epithelial erosions; DES dry eye syndrome; MGD meibomian gland dysfunction; ATs artificial tears; PFAT's preservative free artificial tears; NSC nuclear sclerotic cataract; PSC posterior subcapsular cataract; ERM epi-retinal membrane; PVD posterior vitreous detachment; RD retinal detachment; DM diabetes mellitus; DR diabetic retinopathy; NPDR non-proliferative diabetic retinopathy; PDR proliferative diabetic retinopathy; CSME clinically significant macular edema; DME diabetic macular edema; dbh dot blot hemorrhages; CWS cotton wool spot; POAG primary open angle glaucoma; C/D cup-to-disc ratio; HVF humphrey visual field; GVF goldmann visual field; OCT optical coherence  tomography; IOP intraocular pressure; BRVO Branch retinal vein occlusion; CRVO central retinal vein occlusion; CRAO central retinal artery occlusion; BRAO branch retinal artery occlusion; RT retinal tear; SB scleral buckle; PPV pars plana vitrectomy; VH Vitreous hemorrhage; PRP panretinal laser photocoagulation; IVK intravitreal kenalog; VMT vitreomacular traction; MH Macular hole;  NVD neovascularization of the disc; NVE neovascularization elsewhere; AREDS age related eye disease study; ARMD age related macular degeneration; POAG primary open angle glaucoma; EBMD epithelial/anterior basement membrane dystrophy; ACIOL anterior chamber intraocular lens; IOL intraocular lens; PCIOL posterior chamber intraocular lens; Phaco/IOL phacoemulsification with intraocular lens placement; PRK photorefractive keratectomy; LASIK laser assisted in situ keratomileusis; HTN hypertension; DM diabetes mellitus; COPD chronic obstructive pulmonary disease

## 2024-02-13 ENCOUNTER — Ambulatory Visit (INDEPENDENT_AMBULATORY_CARE_PROVIDER_SITE_OTHER): Admitting: Ophthalmology

## 2024-02-13 ENCOUNTER — Encounter (INDEPENDENT_AMBULATORY_CARE_PROVIDER_SITE_OTHER): Payer: Self-pay | Admitting: Ophthalmology

## 2024-02-13 DIAGNOSIS — E113313 Type 2 diabetes mellitus with moderate nonproliferative diabetic retinopathy with macular edema, bilateral: Secondary | ICD-10-CM

## 2024-02-13 DIAGNOSIS — Z7984 Long term (current) use of oral hypoglycemic drugs: Secondary | ICD-10-CM

## 2024-02-13 DIAGNOSIS — H401130 Primary open-angle glaucoma, bilateral, stage unspecified: Secondary | ICD-10-CM | POA: Diagnosis not present

## 2024-02-13 DIAGNOSIS — H40053 Ocular hypertension, bilateral: Secondary | ICD-10-CM

## 2024-02-13 DIAGNOSIS — Z794 Long term (current) use of insulin: Secondary | ICD-10-CM | POA: Diagnosis not present

## 2024-02-13 DIAGNOSIS — Z961 Presence of intraocular lens: Secondary | ICD-10-CM

## 2024-02-13 MED ORDER — BEVACIZUMAB CHEMO INJECTION 1.25MG/0.05ML SYRINGE FOR KALEIDOSCOPE
1.2500 mg | INTRAVITREAL | Status: AC | PRN
  Administered 2024-02-13: 1.25 mg via INTRAVITREAL

## 2024-02-17 ENCOUNTER — Ambulatory Visit: Admitting: Cardiology

## 2024-03-11 NOTE — Progress Notes (Shared)
 [facility] - Clinic Note  03/12/2024     CHIEF COMPLAINT Patient presents for No chief complaint on file.    HISTORY OF PRESENT ILLNESS: Raymond Munoz. is a 73 y.o. male who presents to the clinic today for:    Pt states his vision is doing well.   Referring physician: Leigh Lung, MD [address] [place],  [place] [address]  HISTORICAL INFORMATION:   Selected notes from the MEDICAL RECORD NUMBER Referred by Dr. Lung Leigh for DM exam LEE:  Ocular Hx- PMH-DM (on Janumet, Lantus  and Metformin ), HTN, arthritis    CURRENT MEDICATIONS: Current Outpatient Medications (Ophthalmic Drugs)  Medication Sig   brimonidine (ALPHAGAN) 0.2 % ophthalmic solution 1 drop 2 (two) times daily.   dorzolamide-timolol (COSOPT) 2-0.5 % ophthalmic solution 1 drop 2 (two) times daily.   ketorolac  (ACULAR ) 0.5 % ophthalmic solution Place 1 drop into the right eye 2 (two) times daily.   No current facility-administered medications for this visit. (Ophthalmic Drugs)   Current Outpatient Medications (Other)  Medication Sig   ACETAMINOPHEN  PO Take 650 mg by mouth as needed.   albuterol  (VENTOLIN  HFA) 108 (90 Base) MCG/ACT inhaler Inhale 1-2 puffs into the lungs every 6 (six) hours as needed for wheezing or shortness of breath.   amLODipine-benazepril (LOTREL) 5-40 MG capsule Take 1 capsule by mouth daily.   aspirin 81 MG chewable tablet Chew 81 mg by mouth daily.   atorvastatin (LIPITOR) 20 MG tablet Take 20 mg by mouth daily.   fexofenadine (ALLEGRA) 180 MG tablet Take 180 mg by mouth daily.   furosemide  (LASIX ) 20 MG tablet Take 2 tablets (40 mg total) by mouth daily.   gabapentin  (NEURONTIN ) 300 MG capsule Take 300-600 mg by mouth 2 (two) times daily. 300mg  in the morning and 600mg  at bedtime   gemfibrozil  (LOPID ) 600 MG tablet Take 600 mg by mouth 2 (two) times daily before a meal.    glucose blood (FREESTYLE LITE) test strip Use as instructed   hydrALAZINE   (APRESOLINE ) 25 MG tablet Take 25 mg by mouth at bedtime.   insulin  glargine (LANTUS  SOLOSTAR) 100 UNIT/ML Solostar Pen Inject 40 Units into the skin at bedtime.   Lancets (FREESTYLE) lancets Use as instructed   metFORMIN  (GLUCOPHAGE ) 1000 MG tablet Take 1 tablet (1,000 mg total) by mouth daily with breakfast.   Omega 3 1200 MG CAPS Take 2,400 mg by mouth 2 (two) times daily.   omeprazole  (PRILOSEC  OTC) 20 MG tablet Take 2 tablets (40 mg total) by mouth daily.   potassium chloride  SA (KLOR-CON  M) 20 MEQ tablet Take 20 mEq by mouth daily.   vitamin C (ASCORBIC ACID) 500 MG tablet Take 500 mg by mouth daily.   No current facility-administered medications for this visit. (Other)   REVIEW OF SYSTEMS:   ALLERGIES Allergies  Allergen Reactions   Codeine     unknown   Ibuprofen     REACTION: unspecified   Aspirin Nausea Only    Irritates stomach-can take 81 mg   PAST MEDICAL HISTORY Past Medical History:  Diagnosis Date   Anxiety    Arthritis    Chest pain    Diabetes mellitus without complication (HCC)    Type 2   Dizziness    DM (diabetes mellitus) (HCC)    ED (erectile dysfunction)    Enlarged heart    GERD (gastroesophageal reflux disease)    HLD (hyperlipidemia)    Hypertension  Localized swelling of both lower legs    Pleural effusion    Shortness of breath    Tachycardia    Past Surgical History:  Procedure Laterality Date   BIOPSY  04/24/2023   Procedure: BIOPSY;  Surgeon: Elicia Claw, MD;  Location: [facility];  Service: Gastroenterology;;   CATARACT EXTRACTION     COLONOSCOPY WITH PROPOFOL  N/A 04/24/2023   Procedure: COLONOSCOPY WITH PROPOFOL ;  Surgeon: Elicia Claw, MD;  Location: [facility];  Service: Gastroenterology;  Laterality: N/A;   ESOPHAGOGASTRODUODENOSCOPY (EGD) WITH PROPOFOL  N/A 04/24/2023   Procedure: ESOPHAGOGASTRODUODENOSCOPY (EGD) WITH PROPOFOL ;  Surgeon: Elicia Claw, MD;  Location: [facility];  Service: Gastroenterology;   Laterality: N/A;   KNEE SURGERY Left    POLYPECTOMY  04/24/2023   Procedure: POLYPECTOMY;  Surgeon: Elicia Claw, MD;  Location: [facility];  Service: Gastroenterology;;   RADIOLOGY WITH ANESTHESIA N/A 08/26/2014   Procedure: MRI LUMBAR SPINE   (RADIOLOGY WITH ANESTHESIA);  Surgeon: Medication Radiologist, MD;  Location: [facility];  Service: Radiology;  Laterality: N/A;   FAMILY HISTORY Family History  Problem Relation Age of Onset   Hypertension Mother    Diabetes type II Mother    Thyroid  disease Mother    SOCIAL HISTORY Social History   Tobacco Use   Smoking status: Never   Smokeless tobacco: Never  Vaping Use   Vaping status: Never Used  Substance Use Topics   Alcohol use: No   Drug use: No       OPHTHALMIC EXAM:   Not recorded    IMAGING AND PROCEDURES  Imaging and Procedures for @TODAY @          ASSESSMENT/PLAN:    ICD-10-CM   1. Moderate nonproliferative diabetic retinopathy of both eyes with macular edema associated with type 2 diabetes mellitus (HCC)  Z88.6686     2. Current use of insulin  (HCC)  Z79.4     3. Long term (current) use of oral hypoglycemic drugs  Z79.84     4. Primary open angle glaucoma of both eyes, unspecified glaucoma stage  H40.1130     5. Bilateral ocular hypertension  H40.053     6. Pseudophakia, both eyes  Z96.1       1-3. 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 - s/p IVA OD #1 (02.27.25), #2 (03.27.25), #3 (05.01.25), #4 (06.05.25), #5 (07.10.25), #6 (08.14.25), #7 (09.18.25), #8 (10.16.25), #9 (11.13.25)  - OS without DME  - last A1c was 8.4 on 01.20.25 - pt lost to follow up from May 2019-December 2022 and then from December 2022-January 2025 - OCT shows OD: persistent central cystic changes / DME; OS: Focal cystic changes nasal mac--slightly increased, Partial PVD at 4 weeks - BCVA 20/25 OU - stable - recommend IVA OD #10 today (12.11.25) with follow up in 4 weeks - pt wishes to proceed with  injection - RBA of procedure discussed, questions answered - IVA informed consent obtained and signed, 02.27.25 - see procedure note - Eylea approved, but pt owes 20% -- pt wishes to stay with Avastin   - f/u in 4 wks -- DFE/OCT, possible injection  4,5. POAG / Ocular hypertension OU  - IOP  - +cupping w/ rim thinning OU - denies family hx of glaucoma - on brimonidine and cosopt bid OU, discontinued timolol per physician  6. Pseudophakia OU  - s/p CE/IOL OS (Dr. Fleeta, OS: 03.13.25; OD: 04.22.25)  - IOL in good position, doing well  - post op drops per Dr. Fleeta  - monitor  Ophthalmic Meds Ordered this visit:  No orders of the defined types were placed in this encounter.    No follow-ups on file.  There are no Patient Instructions on file for this visit.   Explained the diagnoses, plan, and follow up with the patient and they expressed understanding.  Patient expressed understanding of the importance of proper follow up care.   This document serves as a record of services personally performed by Redell JUDITHANN Hans, MD, PhD. It was created on their behalf by Auston Muzzy, COMT. The creation of this record is the provider's dictation and/or activities during the visit.  Electronically signed by: Auston Muzzy, COMT 03/11/24 10:58 AM   Redell JUDITHANN Hans, M.D., Ph.D. Diseases & Surgery of the Retina and Vitreous [facility]   Abbreviations: M myopia (nearsighted); A astigmatism; H hyperopia (farsighted); P presbyopia; Mrx spectacle prescription;  CTL contact lenses; OD right eye; OS left eye; OU both eyes  XT exotropia; ET esotropia; PEK punctate epithelial keratitis; PEE punctate epithelial erosions; DES dry eye syndrome; MGD meibomian gland dysfunction; ATs artificial tears; PFAT's preservative free artificial tears; NSC nuclear sclerotic cataract; PSC posterior subcapsular cataract; ERM epi-retinal membrane; PVD posterior vitreous detachment; RD retinal detachment;  DM diabetes mellitus; DR diabetic retinopathy; NPDR non-proliferative diabetic retinopathy; PDR proliferative diabetic retinopathy; CSME clinically significant macular edema; DME diabetic macular edema; dbh dot blot hemorrhages; CWS cotton wool spot; POAG primary open angle glaucoma; C/D cup-to-disc ratio; HVF humphrey visual field; GVF goldmann visual field; OCT optical coherence tomography; IOP intraocular pressure; BRVO Branch retinal vein occlusion; CRVO central retinal vein occlusion; CRAO central retinal artery occlusion; BRAO branch retinal artery occlusion; RT retinal tear; SB scleral buckle; PPV pars plana vitrectomy; VH Vitreous hemorrhage; PRP panretinal laser photocoagulation; IVK intravitreal kenalog; VMT vitreomacular traction; MH Macular hole;  NVD neovascularization of the disc; NVE neovascularization elsewhere; AREDS age related eye disease study; ARMD age related macular degeneration; POAG primary open angle glaucoma; EBMD epithelial/anterior basement membrane dystrophy; ACIOL anterior chamber intraocular lens; IOL intraocular lens; PCIOL posterior chamber intraocular lens; Phaco/IOL phacoemulsification with intraocular lens placement; PRK photorefractive keratectomy; LASIK laser assisted in situ keratomileusis; HTN hypertension; DM diabetes mellitus; COPD chronic obstructive pulmonary disease

## 2024-03-12 ENCOUNTER — Encounter (INDEPENDENT_AMBULATORY_CARE_PROVIDER_SITE_OTHER): Admitting: Ophthalmology

## 2024-04-07 NOTE — Progress Notes (Shared)
 " [facility] - Clinic Note  04/21/2024     CHIEF COMPLAINT Patient presents for No chief complaint on file.    HISTORY OF PRESENT ILLNESS: Raymond Beck. is a 74 y.o. male who presents to the clinic today for:    Pt states his vision is doing well.   Referring physician: Leigh Lung, MD [address] [place],  [place] [address]  HISTORICAL INFORMATION:   Selected notes from the MEDICAL RECORD NUMBER Referred by Dr. Lung Leigh for DM exam LEE:  Ocular Hx- PMH-DM (on Janumet, Lantus  and Metformin ), HTN, arthritis    CURRENT MEDICATIONS: Current Outpatient Medications (Ophthalmic Drugs)  Medication Sig   brimonidine (ALPHAGAN) 0.2 % ophthalmic solution 1 drop 2 (two) times daily.   dorzolamide-timolol (COSOPT) 2-0.5 % ophthalmic solution 1 drop 2 (two) times daily.   ketorolac  (ACULAR ) 0.5 % ophthalmic solution Place 1 drop into the right eye 2 (two) times daily.   No current facility-administered medications for this visit. (Ophthalmic Drugs)   Current Outpatient Medications (Other)  Medication Sig   ACETAMINOPHEN  PO Take 650 mg by mouth as needed.   albuterol  (VENTOLIN  HFA) 108 (90 Base) MCG/ACT inhaler Inhale 1-2 puffs into the lungs every 6 (six) hours as needed for wheezing or shortness of breath.   amLODipine-benazepril (LOTREL) 5-40 MG capsule Take 1 capsule by mouth daily.   aspirin 81 MG chewable tablet Chew 81 mg by mouth daily.   atorvastatin (LIPITOR) 20 MG tablet Take 20 mg by mouth daily.   fexofenadine (ALLEGRA) 180 MG tablet Take 180 mg by mouth daily.   furosemide  (LASIX ) 20 MG tablet Take 2 tablets (40 mg total) by mouth daily.   gabapentin  (NEURONTIN ) 300 MG capsule Take 300-600 mg by mouth 2 (two) times daily. 300mg  in the morning and 600mg  at bedtime   gemfibrozil  (LOPID ) 600 MG tablet Take 600 mg by mouth 2 (two) times daily before a meal.    glucose blood (FREESTYLE LITE) test strip Use as instructed   hydrALAZINE   (APRESOLINE ) 25 MG tablet Take 25 mg by mouth at bedtime.   insulin  glargine (LANTUS  SOLOSTAR) 100 UNIT/ML Solostar Pen Inject 40 Units into the skin at bedtime.   Lancets (FREESTYLE) lancets Use as instructed   metFORMIN  (GLUCOPHAGE ) 1000 MG tablet Take 1 tablet (1,000 mg total) by mouth daily with breakfast.   Omega 3 1200 MG CAPS Take 2,400 mg by mouth 2 (two) times daily.   omeprazole  (PRILOSEC  OTC) 20 MG tablet Take 2 tablets (40 mg total) by mouth daily.   potassium chloride  SA (KLOR-CON  M) 20 MEQ tablet Take 20 mEq by mouth daily.   vitamin C (ASCORBIC ACID) 500 MG tablet Take 500 mg by mouth daily.   No current facility-administered medications for this visit. (Other)   REVIEW OF SYSTEMS:   ALLERGIES Allergies  Allergen Reactions   Codeine     unknown   Ibuprofen     REACTION: unspecified   Aspirin Nausea Only    Irritates stomach-can take 81 mg   PAST MEDICAL HISTORY Past Medical History:  Diagnosis Date   Anxiety    Arthritis    Chest pain    Diabetes mellitus without complication (HCC)    Type 2   Dizziness    DM (diabetes mellitus) (HCC)    ED (erectile dysfunction)    Enlarged heart    GERD (gastroesophageal reflux disease)    HLD (hyperlipidemia)    Hypertension  Localized swelling of both lower legs    Pleural effusion    Shortness of breath    Tachycardia    Past Surgical History:  Procedure Laterality Date   BIOPSY  04/24/2023   Procedure: BIOPSY;  Surgeon: Elicia Claw, MD;  Location: [facility];  Service: Gastroenterology;;   CATARACT EXTRACTION     COLONOSCOPY WITH PROPOFOL  N/A 04/24/2023   Procedure: COLONOSCOPY WITH PROPOFOL ;  Surgeon: Elicia Claw, MD;  Location: [facility];  Service: Gastroenterology;  Laterality: N/A;   ESOPHAGOGASTRODUODENOSCOPY (EGD) WITH PROPOFOL  N/A 04/24/2023   Procedure: ESOPHAGOGASTRODUODENOSCOPY (EGD) WITH PROPOFOL ;  Surgeon: Elicia Claw, MD;  Location: [facility];  Service: Gastroenterology;   Laterality: N/A;   KNEE SURGERY Left    POLYPECTOMY  04/24/2023   Procedure: POLYPECTOMY;  Surgeon: Elicia Claw, MD;  Location: [facility];  Service: Gastroenterology;;   RADIOLOGY WITH ANESTHESIA N/A 08/26/2014   Procedure: MRI LUMBAR SPINE   (RADIOLOGY WITH ANESTHESIA);  Surgeon: Medication Radiologist, MD;  Location: [facility];  Service: Radiology;  Laterality: N/A;   FAMILY HISTORY Family History  Problem Relation Age of Onset   Hypertension Mother    Diabetes type II Mother    Thyroid  disease Mother    SOCIAL HISTORY Social History   Tobacco Use   Smoking status: Never   Smokeless tobacco: Never  Vaping Use   Vaping status: Never Used  Substance Use Topics   Alcohol use: No   Drug use: No       OPHTHALMIC EXAM:   Not recorded    IMAGING AND PROCEDURES  Imaging and Procedures for @TODAY @          ASSESSMENT/PLAN:  No diagnosis found.   1-3. 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 - s/p IVA OD #1 (02.27.25), #2 (03.27.25), #3 (05.01.25), #4 (06.05.25), #5 (07.10.25), #6 (08.14.25), #7 (09.18.25), #8 (10.16.25), #9 (11.13.25)  - OS without DME  - last A1c was 8.4 on 01.20.25 - pt lost to follow up from May 2019-December 2022 and then from December 2022-January 2025 - OCT shows OD: persistent central cystic changes / DME; OS: Focal cystic changes nasal mac--slightly increased, Partial PVD at 4 weeks - BCVA 20/25 OU - stable - recommend IVA OD #10 today (01.20.26) with follow up in 4 weeks - pt wishes to proceed with injection - RBA of procedure discussed, questions answered - IVA informed consent obtained and signed, 02.27.25 - see procedure note - Eylea approved, but pt owes 20% -- pt wishes to stay with Avastin   - f/u in 4 wks -- DFE/OCT, possible injection  4,5. POAG / Ocular hypertension OU  - IOP  - +cupping w/ rim thinning OU - denies family hx of glaucoma - on brimonidine and cosopt bid OU, discontinued timolol per  physician  6. Pseudophakia OU  - s/p CE/IOL OS (Dr. Fleeta, OS: 03.13.25; OD: 04.22.25)  - IOL in good position, doing well  - post op drops per Dr. Fleeta  - monitor  Ophthalmic Meds Ordered this visit:  No orders of the defined types were placed in this encounter.    No follow-ups on file.  There are no Patient Instructions on file for this visit.   Explained the diagnoses, plan, and follow up with the patient and they expressed understanding.  Patient expressed understanding of the importance of proper follow up care.  This document serves as a record of services personally performed by Redell JUDITHANN Hans, MD, PhD. It was created on their behalf by Wanda GEANNIE Keens,  COT an ophthalmic technician. The creation of this record is the provider's dictation and/or activities during the visit.    Electronically signed by:  Wanda GEANNIE Keens, COT  04/07/2024 9:51 AM    Redell JUDITHANN Hans, M.D., Ph.D. Diseases & Surgery of the Retina and Vitreous [facility]   Abbreviations: M myopia (nearsighted); A astigmatism; H hyperopia (farsighted); P presbyopia; Mrx spectacle prescription;  CTL contact lenses; OD right eye; OS left eye; OU both eyes  XT exotropia; ET esotropia; PEK punctate epithelial keratitis; PEE punctate epithelial erosions; DES dry eye syndrome; MGD meibomian gland dysfunction; ATs artificial tears; PFAT's preservative free artificial tears; NSC nuclear sclerotic cataract; PSC posterior subcapsular cataract; ERM epi-retinal membrane; PVD posterior vitreous detachment; RD retinal detachment; DM diabetes mellitus; DR diabetic retinopathy; NPDR non-proliferative diabetic retinopathy; PDR proliferative diabetic retinopathy; CSME clinically significant macular edema; DME diabetic macular edema; dbh dot blot hemorrhages; CWS cotton wool spot; POAG primary open angle glaucoma; C/D cup-to-disc ratio; HVF humphrey visual field; GVF goldmann visual field; OCT optical coherence  tomography; IOP intraocular pressure; BRVO Branch retinal vein occlusion; CRVO central retinal vein occlusion; CRAO central retinal artery occlusion; BRAO branch retinal artery occlusion; RT retinal tear; SB scleral buckle; PPV pars plana vitrectomy; VH Vitreous hemorrhage; PRP panretinal laser photocoagulation; IVK intravitreal kenalog; VMT vitreomacular traction; MH Macular hole;  NVD neovascularization of the disc; NVE neovascularization elsewhere; AREDS age related eye disease study; ARMD age related macular degeneration; POAG primary open angle glaucoma; EBMD epithelial/anterior basement membrane dystrophy; ACIOL anterior chamber intraocular lens; IOL intraocular lens; PCIOL posterior chamber intraocular lens; Phaco/IOL phacoemulsification with intraocular lens placement; PRK photorefractive keratectomy; LASIK laser assisted in situ keratomileusis; HTN hypertension; DM diabetes mellitus; COPD chronic obstructive pulmonary disease  "

## 2024-04-21 ENCOUNTER — Encounter (INDEPENDENT_AMBULATORY_CARE_PROVIDER_SITE_OTHER): Admitting: Ophthalmology

## 2024-04-21 DIAGNOSIS — H401130 Primary open-angle glaucoma, bilateral, stage unspecified: Secondary | ICD-10-CM

## 2024-04-21 DIAGNOSIS — Z961 Presence of intraocular lens: Secondary | ICD-10-CM

## 2024-04-21 DIAGNOSIS — E113313 Type 2 diabetes mellitus with moderate nonproliferative diabetic retinopathy with macular edema, bilateral: Secondary | ICD-10-CM

## 2024-04-21 DIAGNOSIS — Z794 Long term (current) use of insulin: Secondary | ICD-10-CM

## 2024-04-21 DIAGNOSIS — Z7984 Long term (current) use of oral hypoglycemic drugs: Secondary | ICD-10-CM

## 2024-04-21 DIAGNOSIS — H40053 Ocular hypertension, bilateral: Secondary | ICD-10-CM

## 2024-04-21 NOTE — Progress Notes (Shared)
 " [facility] - Clinic Note  05/04/2024     CHIEF COMPLAINT Patient presents for No chief complaint on file.    HISTORY OF PRESENT ILLNESS: Raymond Munoz. is a 74 y.o. male who presents to the clinic today for:    Pt states his vision is doing well.   Referring physician: Leigh Lung, MD [address] [place],  [place] [address]  HISTORICAL INFORMATION:   Selected notes from the MEDICAL RECORD NUMBER Referred by Dr. Lung Leigh for DM exam LEE:  Ocular Hx- PMH-DM (on Janumet, Lantus  and Metformin ), HTN, arthritis    CURRENT MEDICATIONS: Current Outpatient Medications (Ophthalmic Drugs)  Medication Sig   brimonidine (ALPHAGAN) 0.2 % ophthalmic solution 1 drop 2 (two) times daily.   dorzolamide-timolol (COSOPT) 2-0.5 % ophthalmic solution 1 drop 2 (two) times daily.   ketorolac  (ACULAR ) 0.5 % ophthalmic solution Place 1 drop into the right eye 2 (two) times daily.   No current facility-administered medications for this visit. (Ophthalmic Drugs)   Current Outpatient Medications (Other)  Medication Sig   ACETAMINOPHEN  PO Take 650 mg by mouth as needed.   albuterol  (VENTOLIN  HFA) 108 (90 Base) MCG/ACT inhaler Inhale 1-2 puffs into the lungs every 6 (six) hours as needed for wheezing or shortness of breath.   amLODipine-benazepril (LOTREL) 5-40 MG capsule Take 1 capsule by mouth daily.   aspirin 81 MG chewable tablet Chew 81 mg by mouth daily.   atorvastatin (LIPITOR) 20 MG tablet Take 20 mg by mouth daily.   fexofenadine (ALLEGRA) 180 MG tablet Take 180 mg by mouth daily.   furosemide  (LASIX ) 20 MG tablet Take 2 tablets (40 mg total) by mouth daily.   gabapentin  (NEURONTIN ) 300 MG capsule Take 300-600 mg by mouth 2 (two) times daily. 300mg  in the morning and 600mg  at bedtime   gemfibrozil  (LOPID ) 600 MG tablet Take 600 mg by mouth 2 (two) times daily before a meal.    glucose blood (FREESTYLE LITE) test strip Use as instructed   hydrALAZINE   (APRESOLINE ) 25 MG tablet Take 25 mg by mouth at bedtime.   insulin  glargine (LANTUS  SOLOSTAR) 100 UNIT/ML Solostar Pen Inject 40 Units into the skin at bedtime.   Lancets (FREESTYLE) lancets Use as instructed   metFORMIN  (GLUCOPHAGE ) 1000 MG tablet Take 1 tablet (1,000 mg total) by mouth daily with breakfast.   Omega 3 1200 MG CAPS Take 2,400 mg by mouth 2 (two) times daily.   omeprazole  (PRILOSEC  OTC) 20 MG tablet Take 2 tablets (40 mg total) by mouth daily.   potassium chloride  SA (KLOR-CON  M) 20 MEQ tablet Take 20 mEq by mouth daily.   vitamin C (ASCORBIC ACID) 500 MG tablet Take 500 mg by mouth daily.   No current facility-administered medications for this visit. (Other)   REVIEW OF SYSTEMS:   ALLERGIES Allergies  Allergen Reactions   Codeine     unknown   Ibuprofen     REACTION: unspecified   Aspirin Nausea Only    Irritates stomach-can take 81 mg   PAST MEDICAL HISTORY Past Medical History:  Diagnosis Date   Anxiety    Arthritis    Chest pain    Diabetes mellitus without complication (HCC)    Type 2   Dizziness    DM (diabetes mellitus) (HCC)    ED (erectile dysfunction)    Enlarged heart    GERD (gastroesophageal reflux disease)    HLD (hyperlipidemia)    Hypertension  Localized swelling of both lower legs    Pleural effusion    Shortness of breath    Tachycardia    Past Surgical History:  Procedure Laterality Date   BIOPSY  04/24/2023   Procedure: BIOPSY;  Surgeon: Elicia Claw, MD;  Location: [facility];  Service: Gastroenterology;;   CATARACT EXTRACTION     COLONOSCOPY WITH PROPOFOL  N/A 04/24/2023   Procedure: COLONOSCOPY WITH PROPOFOL ;  Surgeon: Elicia Claw, MD;  Location: [facility];  Service: Gastroenterology;  Laterality: N/A;   ESOPHAGOGASTRODUODENOSCOPY (EGD) WITH PROPOFOL  N/A 04/24/2023   Procedure: ESOPHAGOGASTRODUODENOSCOPY (EGD) WITH PROPOFOL ;  Surgeon: Elicia Claw, MD;  Location: [facility];  Service: Gastroenterology;   Laterality: N/A;   KNEE SURGERY Left    POLYPECTOMY  04/24/2023   Procedure: POLYPECTOMY;  Surgeon: Elicia Claw, MD;  Location: [facility];  Service: Gastroenterology;;   RADIOLOGY WITH ANESTHESIA N/A 08/26/2014   Procedure: MRI LUMBAR SPINE   (RADIOLOGY WITH ANESTHESIA);  Surgeon: Medication Radiologist, MD;  Location: [facility];  Service: Radiology;  Laterality: N/A;   FAMILY HISTORY Family History  Problem Relation Age of Onset   Hypertension Mother    Diabetes type II Mother    Thyroid  disease Mother    SOCIAL HISTORY Social History   Tobacco Use   Smoking status: Never   Smokeless tobacco: Never  Vaping Use   Vaping status: Never Used  Substance Use Topics   Alcohol use: No   Drug use: No       OPHTHALMIC EXAM:   Not recorded    IMAGING AND PROCEDURES  Imaging and Procedures for @TODAY @          ASSESSMENT/PLAN:    ICD-10-CM   1. Moderate nonproliferative diabetic retinopathy of both eyes with macular edema associated with type 2 diabetes mellitus (HCC)  Z88.6686     2. Current use of insulin  (HCC)  Z79.4     3. Long term (current) use of oral hypoglycemic drugs  Z79.84     4. Primary open angle glaucoma of both eyes, unspecified glaucoma stage  H40.1130     5. Bilateral ocular hypertension  H40.053     6. Pseudophakia, both eyes  Z96.1       1-3. 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 - s/p IVA OD #1 (02.27.25), #2 (03.27.25), #3 (05.01.25), #4 (06.05.25), #5 (07.10.25), #6 (08.14.25), #7 (09.18.25), #8 (10.16.25), #9 (11.13.25)  - OS without DME  - last A1c was 8.4 on 01.20.25 - pt lost to follow up from May 2019-December 2022 and then from December 2022-January 2025 - OCT shows OD: persistent central cystic changes / DME; OS: Focal cystic changes nasal mac--slightly increased, Partial PVD at 4 weeks - BCVA 20/25 OU - stable - recommend IVA OD #10 today (02.02.26) with follow up in 4 weeks - pt wishes to proceed with  injection - RBA of procedure discussed, questions answered - IVA informed consent obtained and signed, 02.27.25 - see procedure note - Eylea approved, but pt owes 20% -- pt wishes to stay with Avastin   - f/u in 4 wks -- DFE/OCT, possible injection   4,5. POAG / Ocular hypertension OU  - IOP  - +cupping w/ rim thinning OU - denies family hx of glaucoma - on Brimonidine and Cosopt bid OU, discontinued Timolol per Dr. Fleeta  6. Pseudophakia OU  - s/p CE/IOL OS (Dr. Fleeta, OS: 03.13.25; OD: 04.22.25)  - IOL in good position, doing well  - post op drops per Dr. Fleeta  -  monitor   Ophthalmic Meds Ordered this visit:  No orders of the defined types were placed in this encounter.    No follow-ups on file.  There are no Patient Instructions on file for this visit.   Explained the diagnoses, plan, and follow up with the patient and they expressed understanding.  Patient expressed understanding of the importance of proper follow up care.   This document serves as a record of services personally performed by Redell JUDITHANN Hans, MD, PhD. It was created on their behalf by Paulina Jamse Gay an ophthalmic technician. The creation of this record is the provider's dictation and/or activities during the visit.   Electronically signed by: Paulina JONETTA Gay  04/21/24  10:57 AM    Redell JUDITHANN Hans, M.D., Ph.D. Diseases & Surgery of the Retina and Vitreous [facility]  Abbreviations: M myopia (nearsighted); A astigmatism; H hyperopia (farsighted); P presbyopia; Mrx spectacle prescription;  CTL contact lenses; OD right eye; OS left eye; OU both eyes  XT exotropia; ET esotropia; PEK punctate epithelial keratitis; PEE punctate epithelial erosions; DES dry eye syndrome; MGD meibomian gland dysfunction; ATs artificial tears; PFAT's preservative free artificial tears; NSC nuclear sclerotic cataract; PSC posterior subcapsular cataract; ERM epi-retinal membrane; PVD posterior vitreous detachment;  RD retinal detachment; DM diabetes mellitus; DR diabetic retinopathy; NPDR non-proliferative diabetic retinopathy; PDR proliferative diabetic retinopathy; CSME clinically significant macular edema; DME diabetic macular edema; dbh dot blot hemorrhages; CWS cotton wool spot; POAG primary open angle glaucoma; C/D cup-to-disc ratio; HVF humphrey visual field; GVF goldmann visual field; OCT optical coherence tomography; IOP intraocular pressure; BRVO Branch retinal vein occlusion; CRVO central retinal vein occlusion; CRAO central retinal artery occlusion; BRAO branch retinal artery occlusion; RT retinal tear; SB scleral buckle; PPV pars plana vitrectomy; VH Vitreous hemorrhage; PRP panretinal laser photocoagulation; IVK intravitreal kenalog; VMT vitreomacular traction; MH Macular hole;  NVD neovascularization of the disc; NVE neovascularization elsewhere; AREDS age related eye disease study; ARMD age related macular degeneration; POAG primary open angle glaucoma; EBMD epithelial/anterior basement membrane dystrophy; ACIOL anterior chamber intraocular lens; IOL intraocular lens; PCIOL posterior chamber intraocular lens; Phaco/IOL phacoemulsification with intraocular lens placement; PRK photorefractive keratectomy; LASIK laser assisted in situ keratomileusis; HTN hypertension; DM diabetes mellitus; COPD chronic obstructive pulmonary disease  "

## 2024-04-29 ENCOUNTER — Other Ambulatory Visit: Payer: Self-pay

## 2024-04-29 ENCOUNTER — Encounter (HOSPITAL_COMMUNITY): Payer: Self-pay | Admitting: *Deleted

## 2024-04-29 ENCOUNTER — Emergency Department (HOSPITAL_COMMUNITY)

## 2024-04-29 ENCOUNTER — Observation Stay (HOSPITAL_COMMUNITY)
Admission: EM | Admit: 2024-04-29 | Discharge: 2024-05-01 | Disposition: A | Discharge status: Home / Self Care | Attending: Family Medicine | Admitting: Family Medicine

## 2024-04-29 DIAGNOSIS — E119 Type 2 diabetes mellitus without complications: Secondary | ICD-10-CM | POA: Insufficient documentation

## 2024-04-29 DIAGNOSIS — I10 Essential (primary) hypertension: Secondary | ICD-10-CM | POA: Insufficient documentation

## 2024-04-29 DIAGNOSIS — K3189 Other diseases of stomach and duodenum: Secondary | ICD-10-CM | POA: Insufficient documentation

## 2024-04-29 DIAGNOSIS — D649 Anemia, unspecified: Secondary | ICD-10-CM | POA: Diagnosis not present

## 2024-04-29 DIAGNOSIS — E785 Hyperlipidemia, unspecified: Secondary | ICD-10-CM | POA: Insufficient documentation

## 2024-04-29 DIAGNOSIS — Z7984 Long term (current) use of oral hypoglycemic drugs: Secondary | ICD-10-CM | POA: Insufficient documentation

## 2024-04-29 DIAGNOSIS — R0602 Shortness of breath: Secondary | ICD-10-CM | POA: Insufficient documentation

## 2024-04-29 DIAGNOSIS — Z7982 Long term (current) use of aspirin: Secondary | ICD-10-CM | POA: Insufficient documentation

## 2024-04-29 DIAGNOSIS — Z79899 Other long term (current) drug therapy: Secondary | ICD-10-CM | POA: Insufficient documentation

## 2024-04-29 DIAGNOSIS — Z794 Long term (current) use of insulin: Secondary | ICD-10-CM | POA: Insufficient documentation

## 2024-04-29 DIAGNOSIS — R7989 Other specified abnormal findings of blood chemistry: Secondary | ICD-10-CM | POA: Insufficient documentation

## 2024-04-29 DIAGNOSIS — K219 Gastro-esophageal reflux disease without esophagitis: Secondary | ICD-10-CM | POA: Insufficient documentation

## 2024-04-29 DIAGNOSIS — D509 Iron deficiency anemia, unspecified: Principal | ICD-10-CM | POA: Insufficient documentation

## 2024-04-29 LAB — TROPONIN T, HIGH SENSITIVITY
Troponin T High Sensitivity: 128 ng/L (ref 0–19)
Troponin T High Sensitivity: 106 ng/L (ref 0–19)

## 2024-04-29 LAB — CBC
HCT: 19.5 % — ABNORMAL LOW (ref 39.0–52.0)
Hemoglobin: 5.3 g/dL — CL (ref 13.0–17.0)
MCH: 19.8 pg — ABNORMAL LOW (ref 26.0–34.0)
MCHC: 27.2 g/dL — ABNORMAL LOW (ref 30.0–36.0)
MCV: 72.8 fL — ABNORMAL LOW (ref 80.0–100.0)
Platelets: 301 10*3/uL (ref 150–400)
RBC: 2.68 MIL/uL — ABNORMAL LOW (ref 4.22–5.81)
RDW: 16.2 % — ABNORMAL HIGH (ref 11.5–15.5)
WBC: 9.8 10*3/uL (ref 4.0–10.5)
nRBC: 0.3 % — ABNORMAL HIGH (ref 0.0–0.2)

## 2024-04-29 LAB — PROTIME-INR
INR: 1.1 (ref 0.8–1.2)
Prothrombin Time: 14.5 s (ref 11.4–15.2)

## 2024-04-29 LAB — PREPARE RBC (CROSSMATCH)

## 2024-04-29 LAB — BASIC METABOLIC PANEL WITH GFR
Anion gap: 11 (ref 5–15)
BUN: 24 mg/dL — ABNORMAL HIGH (ref 8–23)
CO2: 25 mmol/L (ref 22–32)
Calcium: 9.8 mg/dL (ref 8.9–10.3)
Chloride: 102 mmol/L (ref 98–111)
Creatinine, Ser: 1.66 mg/dL — ABNORMAL HIGH (ref 0.61–1.24)
GFR, Estimated: 43 mL/min — ABNORMAL LOW
Glucose, Bld: 168 mg/dL — ABNORMAL HIGH (ref 70–99)
Potassium: 4 mmol/L (ref 3.5–5.1)
Sodium: 138 mmol/L (ref 135–145)

## 2024-04-29 LAB — PRO BRAIN NATRIURETIC PEPTIDE: Pro Brain Natriuretic Peptide: 854 pg/mL — ABNORMAL HIGH

## 2024-04-29 LAB — POC OCCULT BLOOD, ED: Fecal Occult Bld: POSITIVE — AB

## 2024-04-29 LAB — CBG MONITORING, ED: Glucose-Capillary: 268 mg/dL — ABNORMAL HIGH (ref 70–99)

## 2024-04-29 MED ORDER — INSULIN GLARGINE-YFGN 100 UNIT/ML ~~LOC~~ SOLN
30.0000 [IU] | Freq: Every day | SUBCUTANEOUS | Status: DC
  Administered 2024-04-29 – 2024-04-30 (×2): 30 [IU] via SUBCUTANEOUS
  Filled 2024-04-29 (×3): qty 0.3

## 2024-04-29 MED ORDER — TRAZODONE HCL 50 MG PO TABS: 25.0000 mg | ORAL_TABLET | Freq: Every evening | ORAL | Status: DC | PRN

## 2024-04-29 MED ORDER — ONDANSETRON HCL 4 MG/2ML IJ SOLN
4.0000 mg | Freq: Four times a day (QID) | INTRAMUSCULAR | Status: DC | PRN
  Administered 2024-04-30: 4 mg via INTRAVENOUS
  Filled 2024-04-29: qty 2

## 2024-04-29 MED ORDER — SODIUM CHLORIDE 0.9% IV SOLUTION: Freq: Once | INTRAVENOUS | Status: DC

## 2024-04-29 MED ORDER — ACETAMINOPHEN 650 MG RE SUPP: 650.0000 mg | Freq: Four times a day (QID) | RECTAL | Status: DC | PRN

## 2024-04-29 MED ORDER — INSULIN ASPART 100 UNIT/ML IJ SOLN
0.0000 [IU] | Freq: Three times a day (TID) | INTRAMUSCULAR | Status: DC
  Administered 2024-04-30 – 2024-05-01 (×2): 3 [IU] via SUBCUTANEOUS
  Administered 2024-05-01: 2 [IU] via SUBCUTANEOUS
  Filled 2024-04-29 (×2): qty 3
  Filled 2024-04-29: qty 2

## 2024-04-29 MED ORDER — ATORVASTATIN CALCIUM 10 MG PO TABS: 20.0000 mg | ORAL_TABLET | Freq: Every day | ORAL | Status: DC

## 2024-04-29 MED ORDER — ONDANSETRON HCL 4 MG PO TABS: 4.0000 mg | ORAL_TABLET | Freq: Four times a day (QID) | ORAL | Status: DC | PRN

## 2024-04-29 MED ORDER — HYDRALAZINE HCL 20 MG/ML IJ SOLN
5.0000 mg | Freq: Four times a day (QID) | INTRAMUSCULAR | Status: DC | PRN
  Administered 2024-04-30: 5 mg via INTRAVENOUS
  Filled 2024-04-29: qty 1

## 2024-04-29 MED ORDER — PANTOPRAZOLE SODIUM 40 MG IV SOLR
40.0000 mg | INTRAVENOUS | Status: DC
  Administered 2024-05-01: 40 mg via INTRAVENOUS
  Filled 2024-04-29: qty 10

## 2024-04-29 MED ORDER — ACETAMINOPHEN 325 MG PO TABS: 650.0000 mg | ORAL_TABLET | Freq: Four times a day (QID) | ORAL | Status: DC | PRN

## 2024-04-29 MED ORDER — ALBUTEROL SULFATE (2.5 MG/3ML) 0.083% IN NEBU: 2.5000 mg | INHALATION_SOLUTION | RESPIRATORY_TRACT | Status: DC | PRN

## 2024-04-29 MED ORDER — PANTOPRAZOLE SODIUM 40 MG IV SOLR
40.0000 mg | Freq: Once | INTRAVENOUS | Status: AC
  Administered 2024-04-29: 40 mg via INTRAVENOUS
  Filled 2024-04-29: qty 10

## 2024-04-29 NOTE — ED Notes (Signed)
 Patient has been repositioned in the bed

## 2024-04-29 NOTE — ED Notes (Signed)
 Save blue tube in main lab

## 2024-04-29 NOTE — ED Provider Notes (Signed)
 " [facility]ERG[facility] DEPARTMENT AT [facility] Provider Note   CSN: [id number] Arrival date & time: 04/29/24  1413     Patient presents with: Shortness of Breath   Raymond Munoz. is a 74 y.o. male.   74 year old male with past medical history of diabetes, hypertension, and hyperlipidemia presenting to the emergency department today with concern for lightheadedness and shortness of breath.  The patient states that he has been feeling unwell now for the past 3 to 4 weeks.  He denies any blood in stool or dark stools.  States that he is lightheaded with exertion or when he stands up.  He came to the emergency department today for further evaluation regarding this.  He denies any associated chest pain.   Shortness of Breath      Prior to Admission medications  Medication Sig Start Date End Date Taking? Authorizing Provider  ACETAMINOPHEN  PO Take 650 mg by mouth as needed.    [provider]  albuterol  (VENTOLIN  HFA) 108 (90 Base) MCG/ACT inhaler Inhale 1-2 puffs into the lungs every 6 (six) hours as needed for wheezing or shortness of breath. 05/04/23   Laurice Maude BROCKS, MD  amLODipine-benazepril (LOTREL) 5-40 MG capsule Take 1 capsule by mouth daily.    [provider]  aspirin 81 MG chewable tablet Chew 81 mg by mouth daily.    [provider]  atorvastatin  (LIPITOR) 20 MG tablet Take 20 mg by mouth daily.    [provider]  brimonidine (ALPHAGAN) 0.2 % ophthalmic solution 1 drop 2 (two) times daily. 09/07/23   [provider]  dorzolamide-timolol (COSOPT) 2-0.5 % ophthalmic solution 1 drop 2 (two) times daily. 09/19/23   [provider]  fexofenadine (ALLEGRA) 180 MG tablet Take 180 mg by mouth daily.    [provider]  furosemide  (LASIX ) 20 MG tablet Take 2 tablets (40 mg total) by mouth daily. 12/07/23   Garrick Charleston, MD  gabapentin  (NEURONTIN ) 300 MG capsule Take 300-600 mg by mouth 2 (two) times daily.  300mg  in the morning and 600mg  at bedtime    [provider]  gemfibrozil  (LOPID ) 600 MG tablet Take 600 mg by mouth 2 (two) times daily before a meal.  01/23/14   [provider]  glucose blood (FREESTYLE LITE) test strip Use as instructed 10/16/14   Tapia, Leisa, PA-C  hydrALAZINE  (APRESOLINE ) 25 MG tablet Take 25 mg by mouth at bedtime. 06/25/23   [provider]  insulin  glargine (LANTUS  SOLOSTAR) 100 UNIT/ML Solostar Pen Inject 40 Units into the skin at bedtime. 04/24/23   Lue Elsie BROCKS, MD  ketorolac  (ACULAR ) 0.5 % ophthalmic solution Place 1 drop into the right eye 2 (two) times daily. 09/01/23   [provider]  Lancets (FREESTYLE) lancets Use as instructed 10/16/14   Tapia, Leisa, PA-C  metFORMIN  (GLUCOPHAGE ) 1000 MG tablet Take 1 tablet (1,000 mg total) by mouth daily with breakfast. 10/16/14   Tapia, Leisa, PA-C  Omega 3 1200 MG CAPS Take 2,400 mg by mouth 2 (two) times daily.    [provider]  omeprazole  (PRILOSEC  OTC) 20 MG tablet Take 2 tablets (40 mg total) by mouth daily. 04/24/23   Lue Elsie BROCKS, MD  potassium chloride  SA (KLOR-CON  M) 20 MEQ tablet Take 20 mEq by mouth daily. 07/30/23   [provider]  vitamin C (ASCORBIC ACID) 500 MG tablet Take 500 mg by mouth daily.    [provider]    Allergies: Codeine,  Ibuprofen, and Aspirin    Review of Systems  Respiratory:  Positive for shortness of breath.   All other systems reviewed and are negative.   Updated Vital Signs BP (!) 148/75 (BP Location: Left Arm)   Pulse 82   Temp 98.3 F (36.8 C) (Oral)   Resp 16   SpO2 100%   Physical Exam Vitals and nursing note reviewed.   Gen: NAD, pale appearing Eyes: PERRL, EOMI HEENT: no oropharyngeal swelling Neck: trachea midline Resp: clear to auscultation bilaterally Card: RRR, no murmurs, rubs, or gallops Abd: nontender, nondistended Extremities: no calf tenderness, no edema Vascular: 2+ radial pulses  bilaterally, 2+ DP pulses bilaterally Skin: no rashes Psyc: acting appropriately   (all labs ordered are listed, but only abnormal results are displayed) Labs Reviewed  BASIC METABOLIC PANEL WITH GFR - Abnormal; Notable for the following components:      Result Value   Glucose, Bld 168 (*)    BUN 24 (*)    Creatinine, Ser 1.66 (*)    GFR, Estimated 43 (*)    All other components within normal limits  CBC - Abnormal; Notable for the following components:   RBC 2.68 (*)    Hemoglobin 5.3 (*)    HCT 19.5 (*)    MCV 72.8 (*)    MCH 19.8 (*)    MCHC 27.2 (*)    RDW 16.2 (*)    nRBC 0.3 (*)    All other components within normal limits  PRO BRAIN NATRIURETIC PEPTIDE - Abnormal; Notable for the following components:   Pro Brain Natriuretic Peptide 854.0 (*)    All other components within normal limits  POC OCCULT BLOOD, ED - Abnormal; Notable for the following components:   Fecal Occult Bld POSITIVE (*)    All other components within normal limits  TROPONIN T, HIGH SENSITIVITY - Abnormal; Notable for the following components:   Troponin T High Sensitivity 128 (*)    All other components within normal limits  PROTIME-INR  PREPARE RBC (CROSSMATCH)  TYPE AND SCREEN  TROPONIN T, HIGH SENSITIVITY    EKG: EKG Interpretation Date/Time:  Wednesday April 29 2024 14:29:21 EST Ventricular Rate:  87 PR Interval:  297 QRS Duration:  84 QT Interval:  355 QTC Calculation: 427 R Axis:   81  Text Interpretation: Sinus rhythm Prolonged PR interval Borderline right axis deviation Abnormal R-wave progression, early transition Confirmed by Ula Barter [phone]) on 04/29/2024 4:51:41 PM  Radiology: DG Chest 2 View Result Date: 04/29/2024 CLINICAL DATA:  Cough, shortness of breath EXAM: CHEST - 2 VIEW COMPARISON:  Prior CT scan of the chest 05/04/2023; prior chest x-ray 12/07/2023 FINDINGS: Cardiac and mediastinal contours are unchanged. Chronic bronchitic changes are similar compared to prior.  No focal airspace infiltrate, pleural effusion, pneumothorax or pulmonary edema. No acute osseous abnormality. IMPRESSION: Stable chest x-ray without evidence of acute cardiopulmonary process. Electronically Signed   By: Wilkie Lent M.D.   On: 04/29/2024 15:24     Procedures   Medications Ordered in the ED  0.9 %  sodium chloride  infusion (Manually program via Guardrails IV Fluids) (has no administration in time range)  pantoprazole  (PROTONIX ) injection 40 mg (has no administration in time range)                                    Medical Decision Making 74 year old male with past medical history of diabetes, hypertension, and hyperlipidemia  presenting to the emergency department today with shortness of breath and lightheadedness.  I will further evaluate the patient here with basic labs to evaluate for anemia or electrolyte abnormalities..  Will obtain an EKG and keep the patient on the cardiac monitor to evaluate for arrhythmias.  D-dimer was ordered from triage.  Patient's hemoglobin is noted to be 5.3 which explains his symptoms and this was discontinued.  The patient's hemoglobin is low at 5.3.  His troponin is elevated here but there are no findings of ischemia on his EKG.  Suspect this is likely due to demand ischemia.  His Hemoccult test is positive but there was no gross blood on his rectal exam chaperoned by nursing staff.  I did send a secure chat message to Dr. Elicia who is the patient's gastroenterologist and a call was placed to hospitalist service for admission.  CRITICAL CARE Performed by: Prentice JONELLE Medicus   Total critical care time: 35 minutes  Critical care time was exclusive of separately billable procedures and treating other patients.  Critical care was necessary to treat or prevent imminent or life-threatening deterioration.  Critical care was time spent personally by me on the following activities: development of treatment plan with patient and/or surrogate  as well as nursing, discussions with consultants, evaluation of patient's response to treatment, examination of patient, obtaining history from patient or surrogate, ordering and performing treatments and interventions, ordering and review of laboratory studies, ordering and review of radiographic studies, pulse oximetry and re-evaluation of patient's condition.   Amount and/or Complexity of Data Reviewed Labs: ordered. Radiology: ordered.  Risk Prescription drug management.        Final diagnoses:  Symptomatic anemia  Elevated troponin    ED Discharge Orders     None          Medicus Prentice JONELLE, MD 04/29/24 1732  "

## 2024-04-29 NOTE — ED Notes (Signed)
Provided pt with sandwich and soda 

## 2024-04-29 NOTE — ED Triage Notes (Signed)
 Pt has been having exertional sob for a few weeks.  Pt states that he has to take a break and feels very sob with any activity, no CP.  No swelling noted in his legs or stomach per pt.

## 2024-04-29 NOTE — H&P (Signed)
 " History and Physical  Raymond Munoz. FMW:[id number] DOB: Sep 27, 1950 DOA: 04/29/2024  PCP: Leigh Lung, MD   Chief Complaint: Weakness, dizziness  HPI: Raymond Munoz. is a 74 y.o. male with medical history significant for hypertension, insulin -dependent type 2 diabetes, GERD, hyperlipidemia and prior history of blood loss anemia being admitted to the hospital with 2 weeks of dizziness, generalized weakness, lack of appetite found to have severe symptomatic anemia with evidence of blood loss.  History is provided by the patient as well as his wife who is at the bedside.  They deny any significant abdominal pain, he takes aspirin daily but no other blood thinners.  He has not noticed any dark or tarry stools, no frank bleeding.  He has some vague nausea intermittently, and has had a poor diet for the last couple of weeks.  Finally decided to come to the hospital for evaluation today, was found to have a hemoglobin of 5.3, was fecal Hemoccult positive on ER provider examination.  He specifically denies any chest pain.  Review of Systems: Please see HPI for pertinent positives and negatives. A complete 10 system review of systems are otherwise negative.  Past Medical History:  Diagnosis Date   Anxiety    Arthritis    Chest pain    Diabetes mellitus without complication (HCC)    Type 2   Dizziness    DM (diabetes mellitus) (HCC)    ED (erectile dysfunction)    Enlarged heart    GERD (gastroesophageal reflux disease)    HLD (hyperlipidemia)    Hypertension    Localized swelling of both lower legs    Pleural effusion    Shortness of breath    Tachycardia    Past Surgical History:  Procedure Laterality Date   BIOPSY  04/24/2023   Procedure: BIOPSY;  Surgeon: Elicia Claw, MD;  Location: [facility];  Service: Gastroenterology;;   CATARACT EXTRACTION     COLONOSCOPY WITH PROPOFOL  N/A 04/24/2023   Procedure: COLONOSCOPY WITH PROPOFOL ;  Surgeon: Elicia Claw, MD;   Location: [facility];  Service: Gastroenterology;  Laterality: N/A;   ESOPHAGOGASTRODUODENOSCOPY (EGD) WITH PROPOFOL  N/A 04/24/2023   Procedure: ESOPHAGOGASTRODUODENOSCOPY (EGD) WITH PROPOFOL ;  Surgeon: Elicia Claw, MD;  Location: [facility];  Service: Gastroenterology;  Laterality: N/A;   KNEE SURGERY Left    POLYPECTOMY  04/24/2023   Procedure: POLYPECTOMY;  Surgeon: Elicia Claw, MD;  Location: [facility];  Service: Gastroenterology;;   RADIOLOGY WITH ANESTHESIA N/A 08/26/2014   Procedure: MRI LUMBAR SPINE   (RADIOLOGY WITH ANESTHESIA);  Surgeon: Medication Radiologist, MD;  Location: [facility] OR;  Service: Radiology;  Laterality: N/A;   Social History:  reports that he has never smoked. He has never used smokeless tobacco. He reports that he does not drink alcohol and does not use drugs.  Allergies[1]  Family History  Problem Relation Age of Onset   Hypertension Mother    Diabetes type II Mother    Thyroid  disease Mother      Prior to Admission medications  Medication Sig Start Date End Date Taking? Authorizing Provider  ACETAMINOPHEN  PO Take 650 mg by mouth as needed.    [provider]  albuterol  (VENTOLIN  HFA) 108 (90 Base) MCG/ACT inhaler Inhale 1-2 puffs into the lungs every 6 (six) hours as needed for wheezing or shortness of breath. 05/04/23   Laurice Maude BROCKS, MD  amLODipine-benazepril (LOTREL) 5-40 MG capsule Take 1 capsule by mouth daily.    [provider]  aspirin 81 MG  chewable tablet Chew 81 mg by mouth daily.    [provider]  atorvastatin  (LIPITOR) 20 MG tablet Take 20 mg by mouth daily.    [provider]  brimonidine (ALPHAGAN) 0.2 % ophthalmic solution 1 drop 2 (two) times daily. 09/07/23   [provider]  dorzolamide-timolol (COSOPT) 2-0.5 % ophthalmic solution 1 drop 2 (two) times daily. 09/19/23   [provider]  fexofenadine (ALLEGRA) 180 MG tablet Take 180 mg by mouth daily.    [provider]  furosemide  (LASIX ) 20 MG tablet Take 2 tablets (40 mg total) by mouth daily. 12/07/23   Garrick Charleston, MD  gabapentin  (NEURONTIN ) 300 MG capsule Take 300-600 mg by mouth 2 (two) times daily. 300mg  in the morning and 600mg  at bedtime    [provider]  gemfibrozil  (LOPID ) 600 MG tablet Take 600 mg by mouth 2 (two) times daily before a meal.  01/23/14   [provider]  glucose blood (FREESTYLE LITE) test strip Use as instructed 10/16/14   Tapia, Leisa, PA-C  hydrALAZINE  (APRESOLINE ) 25 MG tablet Take 25 mg by mouth at bedtime. 06/25/23   [provider]  insulin  glargine (LANTUS  SOLOSTAR) 100 UNIT/ML Solostar Pen Inject 40 Units into the skin at bedtime. 04/24/23   Lue Elsie BROCKS, MD  ketorolac  (ACULAR ) 0.5 % ophthalmic solution Place 1 drop into the right eye 2 (two) times daily. 09/01/23   [provider]  Lancets (FREESTYLE) lancets Use as instructed 10/16/14   Tapia, Leisa, PA-C  metFORMIN  (GLUCOPHAGE ) 1000 MG tablet Take 1 tablet (1,000 mg total) by mouth daily with breakfast. 10/16/14   Tapia, Leisa, PA-C  Omega 3 1200 MG CAPS Take 2,400 mg by mouth 2 (two) times daily.    [provider]  omeprazole  (PRILOSEC  OTC) 20 MG tablet Take 2 tablets (40 mg total) by mouth daily. 04/24/23   Lue Elsie BROCKS, MD  potassium chloride  SA (KLOR-CON  M) 20 MEQ tablet Take 20 mEq by mouth daily. 07/30/23   [provider]  vitamin C (ASCORBIC ACID) 500 MG tablet Take 500 mg by mouth daily.    [provider]    Physical Exam: BP (!) 148/75 (BP Location: Left Arm)   Pulse 82   Temp 98.3 F (36.8 C) (Oral)   Resp 16   SpO2 100%  General:  Alert, oriented, calm, in no acute distress, his wife is at the bedside Eyes: EOMI, clear conjuctivae, white sclerea Neck: supple, no masses, trachea mildline  Cardiovascular: RRR, no murmurs or rubs, no peripheral edema  Respiratory: clear to auscultation bilaterally, no wheezes, no  crackles  Abdomen: soft, nontender, nondistended, normal bowel tones heard  Skin: dry, no rashes  Musculoskeletal: no joint effusions, normal range of motion  Psychiatric: appropriate affect, normal speech  Neurologic: extraocular muscles intact, clear speech, moving all extremities with intact sensorium         Labs on Admission:  Basic Metabolic Panel: Recent Labs  Lab 04/29/24 1510  NA 138  K 4.0  CL 102  CO2 25  GLUCOSE 168*  BUN 24*  CREATININE 1.66*  CALCIUM  9.8   Liver Function Tests: No results for input(s): AST, ALT, ALKPHOS, BILITOT, PROT, ALBUMIN in the last 168 hours. No results for input(s): LIPASE, AMYLASE in the last 168 hours. No results for input(s): AMMONIA in the last 168 hours. CBC: Recent Labs  Lab 04/29/24 1510  WBC 9.8  HGB 5.3*  HCT 19.5*  MCV 72.8*  PLT 301  Cardiac Enzymes: No results for input(s): CKTOTAL, CKMB, CKMBINDEX, TROPONINI in the last 168 hours. BNP (last 3 results) Recent Labs    05/04/23 1142  BNP 72.8    ProBNP (last 3 results) Recent Labs    12/07/23 2000 04/29/24 1626  PROBNP 669.0* 854.0*    CBG: No results for input(s): GLUCAP in the last 168 hours.  Radiological Exams on Admission: DG Chest 2 View Result Date: 04/29/2024 CLINICAL DATA:  Cough, shortness of breath EXAM: CHEST - 2 VIEW COMPARISON:  Prior CT scan of the chest 05/04/2023; prior chest x-ray 12/07/2023 FINDINGS: Cardiac and mediastinal contours are unchanged. Chronic bronchitic changes are similar compared to prior. No focal airspace infiltrate, pleural effusion, pneumothorax or pulmonary edema. No acute osseous abnormality. IMPRESSION: Stable chest x-ray without evidence of acute cardiopulmonary process. Electronically Signed   By: Wilkie Lent M.D.   On: 04/29/2024 15:24   Assessment/Plan Raymond Munoz. is a 74 y.o. male with medical history significant for hypertension, insulin -dependent type 2 diabetes, GERD,  hyperlipidemia and prior history of blood loss anemia being admitted to the hospital with 2 weeks of dizziness, generalized weakness, lack of appetite found to have severe symptomatic anemia with evidence of GI blood loss, but not of active bleeding.   Symptomatic anemia-no evidence of active bleeding, but Hemoccult positive.  Notably, in January 2025 he presented in a similar fashion with normal bowel movements, but symptomatic anemia.  He had upper and lower endoscopy without source of bleeding found.  He resumed his daily aspirin thereafter, and did not have any further workup. -Observation admission -Avoid blood thinners, hold aspirin -Transfuse 2 unit PRBC, goal hemoglobin greater than 7 -ER provider discussed with [facility] Dr. Elicia, who will consult in the morning -Clear liquid diet tonight, n.p.o. after midnight for possible endoscopy  Insulin -dependent type 2 diabetes-not very well-controlled, last hemoglobin A1c 8.4 last year, patient takes Lantus  40 to 70 units nightly depending on blood sugars. -Update A1c -Lantus  30 units nightly -Moderate dose sliding scale  Elevated troponin-patient without chest pain or EKG changes, I suspect this is demand ischemia from the degree of his anemia. -Trend troponin -Monitor on telemetry  Hyperlipidemia-Lipitor  Hypertension-patient and his wife are unsure what medications he takes at home, denies taking amlodipine and hydralazine  which are on the most recent medication list.  Will await med rec completion by pharmacy tech. -IV hydralazine  as needed in the meantime  GERD-IV Protonix   DVT prophylaxis: SCDs only due to concern for GI bleed.    Code Status: Limited: Do not attempt resuscitation (DNR) -DNR-LIMITED -Do Not Intubate/DNI   Consults called: [facility]  Admission status: Observation  Time spent: 53 minutes  Raymond Munoz CHRISTELLA Gail MD [facility] Pager [phone]  If 7PM-7AM, please contact  night-coverage [email or web address] Password [facility]  04/29/2024, 6:07 PM      [1]  Allergies Allergen Reactions   Codeine     unknown   Ibuprofen     REACTION: unspecified   Aspirin Nausea Only    Irritates stomach-can take 81 mg   "

## 2024-04-29 NOTE — ED Provider Triage Note (Signed)
 Emergency Medicine Provider Triage Evaluation Note  Raymond Munoz. , a 74 y.o. male  was evaluated in triage.  Pt complains of shortness of breath, occasional lightheadedness.  Patient notes worsening shortness of breath on exertion over the past several weeks, denies shortness of breath at rest.  Reports cough at baseline that is no worse than usual, denies fevers.  Reports occasional lightheadedness, no syncope or falls.  Denies chest pain.  Review of Systems  Positive: As above Negative: As above  Physical Exam  BP (!) 148/75 (BP Location: Left Arm)   Pulse 82   Temp 98.3 F (36.8 C) (Oral)   Resp 16   SpO2 100%  Gen:   Awake, no distress   Resp:  Normal effort  MSK:   Moves extremities without difficulty  Other:    Medical Decision Making  Medically screening exam initiated at 3:26 PM.  Appropriate orders placed.  Prentice ONEIDA Merilee Mickey. was informed that the remainder of the evaluation will be completed by another provider, this initial triage assessment does not replace that evaluation, and the importance of remaining in the ED until their evaluation is complete.     Glendia Rocky SAILOR, [place] 04/29/24 1528

## 2024-04-30 ENCOUNTER — Observation Stay (HOSPITAL_COMMUNITY): Admitting: Anesthesiology

## 2024-04-30 ENCOUNTER — Encounter (HOSPITAL_COMMUNITY): Payer: Self-pay | Admitting: Internal Medicine

## 2024-04-30 ENCOUNTER — Encounter (HOSPITAL_COMMUNITY): Admission: EM | Disposition: A | Discharge status: Home / Self Care | Payer: Self-pay | Source: Home / Self Care

## 2024-04-30 DIAGNOSIS — K3189 Other diseases of stomach and duodenum: Secondary | ICD-10-CM | POA: Diagnosis not present

## 2024-04-30 DIAGNOSIS — J449 Chronic obstructive pulmonary disease, unspecified: Secondary | ICD-10-CM

## 2024-04-30 DIAGNOSIS — D649 Anemia, unspecified: Secondary | ICD-10-CM | POA: Diagnosis not present

## 2024-04-30 DIAGNOSIS — E785 Hyperlipidemia, unspecified: Secondary | ICD-10-CM

## 2024-04-30 DIAGNOSIS — I10 Essential (primary) hypertension: Secondary | ICD-10-CM

## 2024-04-30 LAB — BPAM RBC
Blood Product Expiration Date: 202602242359
Blood Product Expiration Date: 202602242359
ISSUE DATE / TIME: 202601281830
ISSUE DATE / TIME: 202601282141
Unit Type and Rh: 5100
Unit Type and Rh: 5100

## 2024-04-30 LAB — TYPE AND SCREEN
ABO/RH(D): O POS
Antibody Screen: NEGATIVE
Unit division: 0
Unit division: 0

## 2024-04-30 LAB — TROPONIN T, HIGH SENSITIVITY
Troponin T High Sensitivity: 102 ng/L (ref 0–19)
Troponin T High Sensitivity: 106 ng/L (ref 0–19)

## 2024-04-30 LAB — CBC
HCT: 27.7 % — ABNORMAL LOW (ref 39.0–52.0)
Hemoglobin: 8 g/dL — ABNORMAL LOW (ref 13.0–17.0)
MCH: 22.4 pg — ABNORMAL LOW (ref 26.0–34.0)
MCHC: 28.9 g/dL — ABNORMAL LOW (ref 30.0–36.0)
MCV: 77.6 fL — ABNORMAL LOW (ref 80.0–100.0)
Platelets: 268 10*3/uL (ref 150–400)
RBC: 3.57 MIL/uL — ABNORMAL LOW (ref 4.22–5.81)
RDW: 18.6 % — ABNORMAL HIGH (ref 11.5–15.5)
WBC: 9 10*3/uL (ref 4.0–10.5)
nRBC: 0.2 % (ref 0.0–0.2)

## 2024-04-30 LAB — CBG MONITORING, ED
Glucose-Capillary: 177 mg/dL — ABNORMAL HIGH (ref 70–99)
Glucose-Capillary: 137 mg/dL — ABNORMAL HIGH (ref 70–99)
Glucose-Capillary: 158 mg/dL — ABNORMAL HIGH (ref 70–99)

## 2024-04-30 LAB — BASIC METABOLIC PANEL WITH GFR
Anion gap: 10 (ref 5–15)
BUN: 19 mg/dL (ref 8–23)
CO2: 26 mmol/L (ref 22–32)
Calcium: 10 mg/dL (ref 8.9–10.3)
Chloride: 104 mmol/L (ref 98–111)
Creatinine, Ser: 1.23 mg/dL (ref 0.61–1.24)
GFR, Estimated: >60 mL/min
Glucose, Bld: 224 mg/dL — ABNORMAL HIGH (ref 70–99)
Potassium: 4.2 mmol/L (ref 3.5–5.1)
Sodium: 140 mmol/L (ref 135–145)

## 2024-04-30 LAB — HEMOGLOBIN A1C
Hgb A1c MFr Bld: 9.1 % — ABNORMAL HIGH (ref 4.8–5.6)
Mean Plasma Glucose: 214.47 mg/dL

## 2024-04-30 LAB — GLUCOSE, CAPILLARY
Glucose-Capillary: 161 mg/dL — ABNORMAL HIGH (ref 70–99)
Glucose-Capillary: 179 mg/dL — ABNORMAL HIGH (ref 70–99)

## 2024-04-30 MED ORDER — HYDRALAZINE HCL 25 MG PO TABS
25.0000 mg | ORAL_TABLET | Freq: Once | ORAL | Status: AC
  Administered 2024-04-30: 25 mg via ORAL
  Filled 2024-04-30: qty 1

## 2024-04-30 MED ORDER — SODIUM CHLORIDE 0.9 % IV SOLN: INTRAVENOUS | Status: DC

## 2024-04-30 MED ORDER — PROPOFOL 10 MG/ML IV BOLUS
INTRAVENOUS | Status: AC
  Filled 2024-04-30: qty 20

## 2024-04-30 MED ORDER — HYDRALAZINE HCL 25 MG PO TABS
25.0000 mg | ORAL_TABLET | Freq: Three times a day (TID) | ORAL | Status: DC
  Administered 2024-04-30 – 2024-05-01 (×4): 25 mg via ORAL
  Filled 2024-04-30 (×4): qty 1

## 2024-04-30 MED ORDER — PROPOFOL 500 MG/50ML IV EMUL
INTRAVENOUS | Status: AC
  Filled 2024-04-30: qty 50

## 2024-04-30 MED ORDER — PROPOFOL 500 MG/50ML IV EMUL
INTRAVENOUS | Status: DC | PRN
  Administered 2024-04-30: 50 mg via INTRAVENOUS
  Administered 2024-04-30: 100 ug/kg/min via INTRAVENOUS
  Administered 2024-04-30 (×2): 50 mg via INTRAVENOUS

## 2024-04-30 NOTE — Anesthesia Procedure Notes (Signed)
 Procedure Name: MAC Date/Time: 04/30/2024 11:53 AM  Performed by: Nada Corean CROME, CRNAPre-anesthesia Checklist: Patient identified, Emergency Drugs available, Suction available, Patient being monitored and Timeout performed Patient Re-evaluated:Patient Re-evaluated prior to induction Oxygen Delivery Method: Simple face mask Preoxygenation: Pre-oxygenation with 100% oxygen Induction Type: IV induction Placement Confirmation: positive ETCO2 Dental Injury: Teeth and Oropharynx as per pre-operative assessment

## 2024-04-30 NOTE — Progress Notes (Signed)
 Transition of Care [facility]) - Emergency Department Mini Assessment   Patient Details  Name: Raymond Munoz. MRN: [id number] Date of Birth: 23-Jan-1951  [facility] of [facility] [place] CM/SW Contact:    Camelia JONETTA Cary, RN Phone Number: 04/30/2024, 2:43 PM   Clinical Narrative:  RNCM spoke to Spouse regarding d/c plan. Pt. Will return home via car driven by spouse.Pt. does not have any DME and no Home Health History.    ED Mini Assessment:          Means of departure: (P) Car       Patient Contact and Communications        ,              Choice offered to / list presented to : (P) NA  Admission diagnosis:  Elevated troponin [R79.89] Symptomatic anemia [D64.9] Patient Active Problem List   Diagnosis Date Noted   Symptomatic anemia 04/22/2023   Disorder of skin or subcutaneous tissue 05/11/2008   ANEMIA-IRON DEFICIENCY 01/02/2008   Asthma 01/02/2008   DM (diabetes mellitus), type 2 (HCC) 05/05/2007   HYPERLIPIDEMIA 05/05/2007   Essential hypertension 05/05/2007   ERECTILE DYSFUNCTION 10/07/2006   GERD 09/12/2006   History of colonic polyps 09/12/2006   PCP:  Leigh Lung, MD Pharmacy:   [facility] [id number] - [place], [place] - [address] [place] [address] Phone: [phone] Fax: [phone]

## 2024-04-30 NOTE — Care Management Obs Status (Signed)
 MEDICARE OBSERVATION STATUS NOTIFICATION   Patient Details  Name: Raymond Munoz. MRN: [id number] Date of Birth: 10/15/1950   Medicare Observation Status Notification Given:  Yes    Camelia JONETTA Cary, RN 04/30/2024, 2:42 PM

## 2024-04-30 NOTE — Anesthesia Preprocedure Evaluation (Signed)
"                                    Anesthesia Evaluation  Patient identified by MRN, date of birth, ID band Patient awake    Reviewed: Allergy & Precautions, NPO status , Patient's Chart, lab work & pertinent test results  History of Anesthesia Complications Negative for: history of anesthetic complications  Airway Mallampati: I  TM Distance: >3 FB Neck ROM: Full    Dental  (+) Edentulous Upper, Dental Advisory Given, Missing   Pulmonary asthma , COPD,  COPD inhaler   breath sounds clear to auscultation       Cardiovascular hypertension, Pt. on medications  Rhythm:Regular Rate:Normal  10/2023 ECHO: EF 60 to 65%.  1. LV EF 64 %. The left ventricle has normal function, no regional wall motion abnormalities. There is mild asymmetric left ventricular hypertrophy of the basal-septal segment. The average left ventricular global longitudinal strain is -22.2 %. The global longitudinal strain is normal.   2. RVF is normal. The right ventricular size is normal. There is mildly elevated pulmonary artery systolic pressure. The estimated right ventricular systolic pressure is 41.7 mmHg.   3. The mitral valve is normal in structure. Mild to moderate mitral valve regurgitation. No evidence of mitral stenosis.   4. The aortic valve is tricuspid. Aortic valve regurgitation is not visualized. No aortic stenosis is present.     Neuro/Psych negative neurological ROS     GI/Hepatic ,GERD  Controlled,,  Endo/Other  diabetes (glu 177), Insulin  Dependent, Oral Hypoglycemic Agents    Renal/GU      Musculoskeletal   Abdominal   Peds  Hematology Hb 8.0, plt 268k   Anesthesia Other Findings   Reproductive/Obstetrics                              Anesthesia Physical Anesthesia Plan  ASA: 3  Anesthesia Plan: MAC   Post-op Pain Management: Minimal or no pain anticipated   Induction:   PONV Risk Score and Plan: 1 and Treatment may vary due to age or  medical condition  Airway Management Planned: Natural Airway and Simple Face Mask  Additional Equipment: None  Intra-op Plan:   Post-operative Plan:   Informed Consent: I have reviewed the patients History and Physical, chart, labs and discussed the procedure including the risks, benefits and alternatives for the proposed anesthesia with the patient or authorized representative who has indicated his/her understanding and acceptance.   Patient has DNR.  Discussed DNR with patient and Suspend DNR.   Dental advisory given  Plan Discussed with: CRNA and Surgeon  Anesthesia Plan Comments:         Anesthesia Quick Evaluation  "

## 2024-04-30 NOTE — Progress Notes (Signed)
 Referring Provider: TH Primary Care Physician:  Leigh Lung, MD Primary Gastroenterologist: Sampson  Reason for Consultation: Symptomatic anemia  HPI: Raymond Munoz. is a 74 y.o. male with past medical history of insulin -dependent diabetes, hypertension, GERD, history of anemia presented to the hospital with weakness and dizziness of 2 weeks duration.  Was found to have severe anemia with hemoglobin of 5.3.  Hemoccult positive.  Has mildly elevated troponins.  GI is consulted for further evaluation.  Hemoglobin improved to 8 after blood transfusion.  Patient seen and examined at bedside.  He denies seeing any blood in the stool or black stool.  Denies abdominal pain, nausea or vomiting.  Denies fever or chills.  Denies using NSAIDs.  He had some difficulty breathing prior to blood transfusion which has resolved now.  Denies any active chest pain.  He was admitted to the hospital in January 2025 with iron deficiency anemia with hemoglobin of 5.7 and heme positive stool. -EGD showed mild gastritis.  Gastric biopsies negative for H. pylori.   Colonoscopy showed small cecal polyp and small rectal nodule.  No evidence of active bleeding.  Benign colonic polyps.  Outpatient capsule endoscopy was recommended.  Past Medical History:  Diagnosis Date   Anxiety    Arthritis    Chest pain    Diabetes mellitus without complication (HCC)    Type 2   Dizziness    DM (diabetes mellitus) (HCC)    ED (erectile dysfunction)    Enlarged heart    GERD (gastroesophageal reflux disease)    HLD (hyperlipidemia)    Hypertension    Localized swelling of both lower legs    Pleural effusion    Shortness of breath    Tachycardia     Past Surgical History:  Procedure Laterality Date   BIOPSY  04/24/2023   Procedure: BIOPSY;  Surgeon: Elicia Claw, MD;  Location: [facility];  Service: Gastroenterology;;   CATARACT EXTRACTION     COLONOSCOPY WITH PROPOFOL  N/A 04/24/2023   Procedure:  COLONOSCOPY WITH PROPOFOL ;  Surgeon: Elicia Claw, MD;  Location: [facility];  Service: Gastroenterology;  Laterality: N/A;   ESOPHAGOGASTRODUODENOSCOPY (EGD) WITH PROPOFOL  N/A 04/24/2023   Procedure: ESOPHAGOGASTRODUODENOSCOPY (EGD) WITH PROPOFOL ;  Surgeon: Elicia Claw, MD;  Location: [facility];  Service: Gastroenterology;  Laterality: N/A;   KNEE SURGERY Left    POLYPECTOMY  04/24/2023   Procedure: POLYPECTOMY;  Surgeon: Elicia Claw, MD;  Location: [facility];  Service: Gastroenterology;;   RADIOLOGY WITH ANESTHESIA N/A 08/26/2014   Procedure: MRI LUMBAR SPINE   (RADIOLOGY WITH ANESTHESIA);  Surgeon: Medication Radiologist, MD;  Location: [facility];  Service: Radiology;  Laterality: N/A;    Prior to Admission medications  Medication Sig Start Date End Date Taking? Authorizing Provider  acetaminophen  (TYLENOL  8 HOUR) 650 MG CR tablet Take 650 mg by mouth every 8 (eight) hours as needed for pain.   Yes [provider]  albuterol  (VENTOLIN  HFA) 108 (90 Base) MCG/ACT inhaler Inhale 1-2 puffs into the lungs every 6 (six) hours as needed for wheezing or shortness of breath. 05/04/23  Yes Messick, Maude BROCKS, MD  amLODipine-benazepril (LOTREL) 5-40 MG capsule Take 1 capsule by mouth daily.   Yes [provider]  aspirin 81 MG chewable tablet Chew 81 mg by mouth at bedtime.   Yes [provider]  dorzolamide-timolol (COSOPT) 2-0.5 % ophthalmic solution Place 1 drop into both eyes in the morning and at bedtime. 09/19/23  Yes [provider]  fexofenadine (ALLEGRA) 180 MG tablet  Take 180 mg by mouth daily as needed for allergies or rhinitis.   Yes [provider]  furosemide  (LASIX ) 20 MG tablet Take 2 tablets (40 mg total) by mouth daily. Patient taking differently: Take 20 mg by mouth 2 (two) times daily as needed for fluid or edema. 12/07/23  Yes Garrick Charleston, MD  gabapentin  (NEURONTIN ) 300 MG capsule Take 600 mg by mouth in the morning and  at bedtime.   Yes [provider]  gemfibrozil  (LOPID ) 600 MG tablet Take 600 mg by mouth 2 (two) times daily before a meal.  01/23/14  Yes [provider]  hydrALAZINE  (APRESOLINE ) 25 MG tablet Take 25 mg by mouth at bedtime. 06/25/23  Yes [provider]  insulin  glargine (LANTUS  SOLOSTAR) 100 UNIT/ML Solostar Pen Inject 40 Units into the skin at bedtime. 04/24/23  Yes Lue Elsie BROCKS, MD  ketorolac  (ACULAR ) 0.5 % ophthalmic solution Place 1 drop into the right eye daily. 09/01/23  Yes [provider]  metFORMIN  (GLUCOPHAGE ) 1000 MG tablet Take 1 tablet (1,000 mg total) by mouth daily with breakfast. 10/16/14  Yes Tapia, Leisa, PA-C  Omega 3 1200 MG CAPS Take 1,200 mg by mouth 2 (two) times daily.   Yes [provider]  omeprazole  (PRILOSEC  OTC) 20 MG tablet Take 2 tablets (40 mg total) by mouth daily. Patient taking differently: Take 20 mg by mouth daily before breakfast. 04/24/23  Yes Lue Elsie BROCKS, MD  REFRESH TEARS 0.5 % SOLN Place 1 drop into both eyes 2 (two) times daily.   Yes [provider]  timolol (BETIMOL) 0.5 % ophthalmic solution Place 1 drop into both eyes in the morning and at bedtime.   Yes [provider]  vitamin C (ASCORBIC ACID) 500 MG tablet Take 500 mg by mouth daily.   Yes [provider]  atorvastatin  (LIPITOR) 20 MG tablet Take 20 mg by mouth daily.    [provider]  glucose blood (FREESTYLE LITE) test strip Use as instructed 10/16/14   Tapia, Leisa, PA-C  Lancets (FREESTYLE) lancets Use as instructed 10/16/14   Tapia, Leisa, PA-C    Scheduled Meds:  sodium chloride    Intravenous Once   sodium chloride    Intravenous Once   insulin  aspart  0-15 Units Subcutaneous TID WC   insulin  glargine-yfgn  30 Units Subcutaneous QHS   pantoprazole  (PROTONIX ) IV  40 mg Intravenous Q24H   Continuous Infusions: PRN Meds:.acetaminophen  **OR** acetaminophen , albuterol , hydrALAZINE , ondansetron   **OR** ondansetron  (ZOFRAN ) IV, traZODone   Allergies as of 04/29/2024 - Review Complete 04/29/2024  Allergen Reaction Noted   Codeine Other (See Comments) 05/05/2007   Ibuprofen Other (See Comments) 06/14/2006   Aspirin Nausea Only and Other (See Comments) 05/05/2007    Family History  Problem Relation Age of Onset   Hypertension Mother    Diabetes type II Mother    Thyroid  disease Mother     Social History   Socioeconomic History   Marital status: Married    Spouse name: Not on file   Number of children: Not on file   Years of education: Not on file   Highest education level: Not on file  Occupational History   Not on file  Tobacco Use   Smoking status: Never   Smokeless tobacco: Never  Vaping Use   Vaping status: Never Used  Substance and Sexual Activity   Alcohol use: No   Drug use: No   Sexual activity: Yes  Other Topics Concern   Not on file  Social History Narrative   Not on file   Social Drivers of Health   Tobacco Use: Low Risk (04/29/2024)   Patient History    Smoking Tobacco Use: Never    Smokeless Tobacco Use: Never    Passive Exposure: Not on file  Financial Resource Strain: Not on file  Food Insecurity: No Food Insecurity (04/23/2023)   Hunger Vital Sign    Worried About Running Out of Food in the Last Year: Never true    Ran Out of Food in the Last Year: Never true  Transportation Needs: No Transportation Needs (04/23/2023)   PRAPARE - Administrator, Civil Service (Medical): No    Lack of Transportation (Non-Medical): No  Physical Activity: Not on file  Stress: Not on file  Social Connections: Moderately Integrated (04/23/2023)   Social Connection and Isolation Panel    Frequency of Communication with Friends and Family: More than three times a week    Frequency of Social Gatherings with Friends and Family: Once a week    Attends Religious Services: 1 to 4 times per year    Active Member of [employer] or Organizations: No    Attends  Banker Meetings: Never    Marital Status: Married  Catering Manager Violence: Not At Risk (04/23/2023)   Humiliation, Afraid, Rape, and Kick questionnaire    Fear of Current or Ex-Partner: No    Emotionally Abused: No    Physically Abused: No    Sexually Abused: No  Depression (PHQ2-9): Not on file  Alcohol Screen: Not on file  Housing: Low Risk (04/23/2023)   Housing Stability Vital Sign    Unable to Pay for Housing in the Last Year: No    Number of Times Moved in the Last Year: 0    Homeless in the Last Year: No  Utilities: Not At Risk (04/23/2023)   AHC Utilities    Threatened with loss of utilities: No  Health Literacy: Not on file    Review of Systems: All negative except as stated above in HPI.  Physical Exam: Vital signs: Vitals:   04/30/24 0600 04/30/24 0700  BP: (!) 157/91 (!) 181/95  Pulse: 100 63  Resp:  19  Temp:    SpO2: 100% 91%     General:   Alert,  Well-developed, well-nourished, pleasant and cooperative in NAD Normocephalic, atraumatic Extraocular movement intact Lungs: No visible respiratory distress Heart:  Regular rate and rhythm; no murmurs, clicks, rubs,  or gallops. Abdomen: Soft, nontender, nondistended, bowel sound present, no peritoneal signs Mood and affect normal Alert and oriented x 3 Rectal:  Deferred  GI:  Lab Results: Recent Labs    04/29/24 1510 04/30/24 0518  WBC 9.8 9.0  HGB 5.3* 8.0*  HCT 19.5* 27.7*  PLT 301 268   BMET Recent Labs    04/29/24 1510 04/30/24 0518  NA 138 140  K 4.0 4.2  CL 102 104  CO2 25 26  GLUCOSE 168* 224*  BUN 24* 19  CREATININE 1.66* 1.23  CALCIUM  9.8 10.0   LFT No results for input(s): PROT, ALBUMIN, AST, ALT, ALKPHOS, BILITOT, BILIDIR, IBILI in the last 72 hours. PT/INR Recent Labs    04/29/24 1626  LABPROT 14.5  INR 1.1     Studies/Results: DG Chest 2 View Result Date: 04/29/2024 CLINICAL DATA:  Cough, shortness of breath EXAM: CHEST - 2 VIEW  COMPARISON:  Prior CT scan of the chest 05/04/2023; prior chest x-ray 12/07/2023 FINDINGS: Cardiac and mediastinal contours are  unchanged. Chronic bronchitic changes are similar compared to prior. No focal airspace infiltrate, pleural effusion, pneumothorax or pulmonary edema. No acute osseous abnormality. IMPRESSION: Stable chest x-ray without evidence of acute cardiopulmonary process. Electronically Signed   By: Wilkie Lent M.D.   On: 04/29/2024 15:24    Impression/Plan: -Symptomatic anemia with heme positive stool.  No overt bleeding.  Hemoglobin 5.3 on admission. -Blood loss anemia.  Status post blood transfusion. - Mild elevated troponin.  No chest pain.  Likely related to severe anemia  Recommendations --------------------------- - Recommend repeat EGD and if negative recommend applying capsule endoscopy.  Risk of capsule retention discussed with the patient and family.  They verbalized understanding. - Continue other supportive care  Risks (bleeding, infection, bowel perforation that could require surgery, sedation-related changes in cardiopulmonary systems), benefits (identification and possible treatment of source of symptoms, exclusion of certain causes of symptoms), and alternatives (watchful waiting, radiographic imaging studies, empiric medical treatment)  were explained to patient/family in detail and patient wishes to proceed.     LOS: 0 days   Layla Lah  MD, FACP 04/30/2024, 8:57 AM  Contact #  [phone]

## 2024-04-30 NOTE — Progress Notes (Addendum)
 " PROGRESS NOTE    Raymond Munoz.  FMW:[id number] DOB: 1950-04-13 DOA: 04/29/2024  PCP: Leigh Lung, MD   Brief Narrative: This 74 y.o. male with medical history significant for hypertension, insulin -dependent type 2 diabetes, GERD, hyperlipidemia and prior history of blood loss anemia being admitted to the hospital with 2 weeks of dizziness, generalized weakness, lack of appetite and found to have severe symptomatic anemia with evidence of blood loss. He denies any significant abdominal pain, he takes baby aspirin but no other medicines.  He has not had any dark stools or frank bleeding. He has had poor appetite for the last couple of weeks.  Patient finally decided to come to the ED for evaluation and he is found to have hemoglobin 5.3.  FOBT was positive. Patient was admitted for further evaluation and GI is consulted.  Patient is scheduled to have EGD today.  Assessment & Plan:   Principal Problem:   Symptomatic anemia  Symptomatic anemia: Patient presented with generalized weakness, dizziness, fatigue, lack of appetite,  found to have hemoglobin 5.3. No evidence of active bleeding, but Hemoccult positive.   Last year in January he presented with similar symptoms with normal bowel movements. He had upper and lower endoscopy without source of bleeding found.  He resumed his daily aspirin thereafter, and did not have any further workup. Avoid blood thinners, Hold aspirin Status post 2 unit PRBC hemoglobin improved to 8.0. GI consulted Dr. Elicia will see the patient, He is scheduled for EGD today. Follow up Post EGD orders.   Insulin -dependent type 2 diabetes: Not very well-controlled, last hemoglobin A1c 8.4. Last year, patient takes Lantus  40 to 70 units nightly depending on blood sugars. Hb A1c 9.1 Continue Lantus  30 units nightly Continue Moderate dose sliding scale   Elevated troponin: Patient without chest pain or EKG changes, I suspect this is demand ischemia from  the degree of his anemia. Troponin trend 106 > 128 > 106 > 102 Monitor on telemetry   Hyperlipidemia: Continue Lipitor.   Essential Hypertension: Patient and his wife are unsure what medications he takes at home, denies taking amlodipine and hydralazine  which are on the most recent medication list.   -IV hydralazine  as needed in the meantime. Start hydralazine  25 mg every 8 hours.   GERD: Continue IV Protonix    DVT prophylaxis: SCDs Code Status: DNR Family Communication: Spouse at bed side. Disposition Plan:   Status is: Observation The patient remains OBS appropriate and will d/c before 2 midnights.  Patient admitted for symptomatic anemia,  found to have FOBT positive.  Status post 2 unit PRBC.  Hemoglobin improved to 8.0  GI is consulted patient is scheduled for EGD today  Patient is not medically ready for discharge yet.  Consultants:  Gastroenterology  Procedures: EGD today  Antimicrobials:  Anti-infectives (From admission, onward)    None      Subjective: Patient was seen and examined at bedside.  Overnight events noted. Patient reports feeling better after getting 2 units of blood transfusion.  Hemoglobin has improved to 8.0. Patient is scheduled to have EGD today.  Objective: Vitals:   04/30/24 1218 04/30/24 1230 04/30/24 1240 04/30/24 1250  BP: 133/68 (!) 162/73 (!) 175/85 (!) 187/90  Pulse:  96 95 95  Resp: (!) 22 (!) 21 (!) 21 15  Temp: 98 F (36.7 C)     TempSrc: Temporal     SpO2: 92% 95% 95% 94%  Weight:      Height:  Intake/Output Summary (Last 24 hours) at 04/30/2024 1331 Last data filed at 04/30/2024 1221 Gross per 24 hour  Intake 910.15 ml  Output --  Net 910.15 ml   Filed Weights   04/30/24 1052  Weight: 84.4 kg    Examination:  General exam: Appears calm and comfortable, not in any acute distress. Respiratory system: CTA Bilaterally. Respiratory effort normal.  RR 14 Cardiovascular system: S1 & S2 heard, RRR. No JVD,  murmurs, rubs, gallops or clicks.  Gastrointestinal system: Abdomen is non distended, soft and non tender. Normal bowel sounds heard. Central nervous system: Alert and oriented x 3. No focal neurological deficits. Extremities: No edema, No cyanosis, No clubbing  Skin: No rashes, lesions or ulcers Psychiatry: Judgement and insight appear normal. Mood & affect appropriate.   Data Reviewed: I have personally reviewed following labs and imaging studies  CBC: Recent Labs  Lab 04/29/24 1510 04/30/24 0518  WBC 9.8 9.0  HGB 5.3* 8.0*  HCT 19.5* 27.7*  MCV 72.8* 77.6*  PLT 301 268   Basic Metabolic Panel: Recent Labs  Lab 04/29/24 1510 04/30/24 0518  NA 138 140  K 4.0 4.2  CL 102 104  CO2 25 26  GLUCOSE 168* 224*  BUN 24* 19  CREATININE 1.66* 1.23  CALCIUM  9.8 10.0   GFR: Estimated Creatinine Clearance: 56.6 mL/min (by C-G formula based on SCr of 1.23 mg/dL). Liver Function Tests: No results for input(s): AST, ALT, ALKPHOS, BILITOT, PROT, ALBUMIN in the last 168 hours. No results for input(s): LIPASE, AMYLASE in the last 168 hours. No results for input(s): AMMONIA in the last 168 hours. Coagulation Profile: Recent Labs  Lab 04/29/24 1626  INR 1.1   Cardiac Enzymes: No results for input(s): CKTOTAL, CKMB, CKMBINDEX, TROPONINI in the last 168 hours. BNP (last 3 results) Recent Labs    12/07/23 2000 04/29/24 1626  PROBNP 669.0* 854.0*   HbA1C: Recent Labs    04/30/24 0518  HGBA1C 9.1*   CBG: Recent Labs  Lab 04/29/24 2229 04/30/24 0805  GLUCAP 268* 177*   Lipid Profile: No results for input(s): CHOL, HDL, LDLCALC, TRIG, CHOLHDL, LDLDIRECT in the last 72 hours. Thyroid  Function Tests: No results for input(s): TSH, T4TOTAL, FREET4, T3FREE, THYROIDAB in the last 72 hours. Anemia Panel: No results for input(s): VITAMINB12, FOLATE, FERRITIN, TIBC, IRON, RETICCTPCT in the last 72 hours. Sepsis  Labs: No results for input(s): PROCALCITON, LATICACIDVEN in the last 168 hours.  No results found for this or any previous visit (from the past 240 hours).   Radiology Studies: DG Chest 2 View Result Date: 04/29/2024 CLINICAL DATA:  Cough, shortness of breath EXAM: CHEST - 2 VIEW COMPARISON:  Prior CT scan of the chest 05/04/2023; prior chest x-ray 12/07/2023 FINDINGS: Cardiac and mediastinal contours are unchanged. Chronic bronchitic changes are similar compared to prior. No focal airspace infiltrate, pleural effusion, pneumothorax or pulmonary edema. No acute osseous abnormality. IMPRESSION: Stable chest x-ray without evidence of acute cardiopulmonary process. Electronically Signed   By: Wilkie Lent M.D.   On: 04/29/2024 15:24   Scheduled Meds:  [MAR Hold] sodium chloride    Intravenous Once   [MAR Hold] sodium chloride    Intravenous Once   hydrALAZINE   25 mg Oral Q8H   [MAR Hold] insulin  aspart  0-15 Units Subcutaneous TID WC   [MAR Hold] insulin  glargine-yfgn  30 Units Subcutaneous QHS   [MAR Hold] pantoprazole  (PROTONIX ) IV  40 mg Intravenous Q24H   Continuous Infusions:  sodium chloride  Stopped (04/30/24 1259)  LOS: 0 days    Time spent: 50 MINS   Darcel Dawley, MD [facility]   If 7PM-7AM, please contact night-coverage  "

## 2024-04-30 NOTE — Anesthesia Postprocedure Evaluation (Signed)
"   Anesthesia Post Note  Patient: Raymond Munoz.  Procedure(s) Performed: EGD (ESOPHAGOGASTRODUODENOSCOPY) IMAGING PROCEDURE, GI TRACT, INTRALUMINAL, VIA CAPSULE     Patient location during evaluation: Endoscopy Anesthesia Type: MAC Level of consciousness: oriented, patient cooperative and awake and alert Pain management: pain level controlled Vital Signs Assessment: post-procedure vital signs reviewed and stable Respiratory status: spontaneous breathing, nonlabored ventilation and respiratory function stable Cardiovascular status: blood pressure returned to baseline and stable Postop Assessment: no apparent nausea or vomiting Anesthetic complications: no   No notable events documented.  Last Vitals:  Vitals:   04/30/24 1218 04/30/24 1230  BP: 133/68 (!) 162/73  Pulse:  96  Resp: (!) 22 (!) 21  Temp: 36.7 C   SpO2: 92% 95%    Last Pain:  Vitals:   04/30/24 1230  TempSrc:   PainSc: 0-No pain                 Raymond Munoz,Raymond Munoz      "

## 2024-04-30 NOTE — Op Note (Signed)
 [facility] Patient Name: Raymond Munoz Procedure Date: 04/30/2024 MRN: [id number] Attending MD: Layla Lah , MD, [id number] Date of Birth: 18-Oct-1950 CSN: [id number] Age: 74 Admit Type: Outpatient Procedure:                Upper GI endoscopy Indications:              Unexplained iron deficiency anemia Providers:                Layla Lah, MD, Darleene Bare, RN, Fairy Marina, Technician Referring MD:              Medicines:                Sedation Administered by an Anesthesia Professional Complications:            No immediate complications. Estimated Blood Loss:     Estimated blood loss was minimal. Procedure:                Pre-Anesthesia Assessment:                           - Prior to the procedure, a History and Physical                            was performed, and patient medications and                            allergies were reviewed. The patient's tolerance of                            previous anesthesia was also reviewed. The risks                            and benefits of the procedure and the sedation                            options and risks were discussed with the patient.                            All questions were answered, and informed consent                            was obtained. Prior Anticoagulants: The patient has                            taken no anticoagulant or antiplatelet agents. ASA                            Grade Assessment: III - A patient with severe                            systemic disease. After reviewing the risks and  benefits, the patient was deemed in satisfactory                            condition to undergo the procedure.                           After obtaining informed consent, the endoscope was                            passed under direct vision. Throughout the                            procedure, the patient's blood pressure, pulse,  and                            oxygen saturations were monitored continuously. The                            [id number]-H190 ([id number]) Olympus endoscope was introduced                            through the mouth, and advanced to the second part                            of duodenum. The upper GI endoscopy was                            accomplished without difficulty. The patient                            tolerated the procedure well. Scope In: Scope Out: Findings:      The Z-line was regular and was found 39 cm from the incisors.      Patchy mildly erythematous mucosa was found in the prepyloric region of       the stomach.      The cardia and gastric fundus were normal on retroflexion.      The duodenal bulb, first portion of the duodenum and second portion of       the duodenum were normal. Using the endoscope, the video capsule       enteroscope was advanced into the second portion of the duodenum. Impression:               - Z-line regular, 39 cm from the incisors.                           - Erythematous mucosa in the prepyloric region of                            the stomach.                           - Normal duodenal bulb, first portion of the                            duodenum and second portion of  the duodenum.                           - Successful completion of the Video Capsule                            Enteroscope placement.                           - No specimens collected. Moderate Sedation:      Moderate (conscious) sedation was personally administered by an       anesthesia professional. The following parameters were monitored: oxygen       saturation, heart rate, blood pressure, and response to care. Recommendation:           - Return patient to hospital ward for ongoing care.                           - Clear liquid diet.                           - Continue present medications. Procedure Code(s):        --- Professional ---                           [phone],  Esophagogastroduodenoscopy, flexible,                            transoral; diagnostic, including collection of                            specimen(s) by brushing or washing, when performed                            (separate procedure) Diagnosis Code(s):        --- Professional ---                           K31.89, Other diseases of stomach and duodenum                           D50.9, Iron deficiency anemia, unspecified CPT copyright 2022 [employer]. All rights reserved. The codes documented in this report are preliminary and upon coder review may  be revised to meet current compliance requirements. Layla Lah, MD Layla Lah, MD 04/30/2024 12:32:24 PM Number of Addenda: 0

## 2024-04-30 NOTE — Progress Notes (Signed)
 Givens capsule endoscopy ordered by MD Brahmbhatt.  Patient ingested capsule at 1210.  Per Given's capsule instructions, patient to remain NPO until 1410 at which time they may progress to clear liquid diet. At 1610 patient may have a small snack such as a half a sandwich or a bowl of soup. At 2010 patient may progress to previously ordered diet.  The capsule endoscopy study will conclude 05/01/2024  at 0010 at which time the recorder and leads or belt can be removed and placed in a patient belongings bag. Endoscopy staff will pick up the equipment in the AM.  Instructions provided to patient and inpatient RN. Patient and RN demonstrated understanding.

## 2024-04-30 NOTE — ED Notes (Signed)
 Writer attempted to pull meds for pt, however pyxis advised meds are not due until tomorrow 1-30.

## 2024-04-30 NOTE — Transfer of Care (Signed)
 Immediate Anesthesia Transfer of Care Note  Patient: Raymond Munoz.  Procedure(s) Performed: EGD (ESOPHAGOGASTRODUODENOSCOPY) IMAGING PROCEDURE, GI TRACT, INTRALUMINAL, VIA CAPSULE  Patient Location: PACU and Endoscopy Unit  Anesthesia Type:MAC  Level of Consciousness: awake, alert , oriented, and patient cooperative  Airway & Oxygen Therapy: Patient Spontanous Breathing  Post-op Assessment: Report given to RN and Post -op Vital signs reviewed and stable  Post vital signs: Reviewed and stable  Last Vitals:  Vitals Value Taken Time  BP 133/68   Temp    Pulse 99   Resp 16   SpO2 97     Last Pain:  Vitals:   04/30/24 1052  TempSrc: Temporal  PainSc: 0-No pain         Complications: No notable events documented.

## 2024-05-01 ENCOUNTER — Observation Stay (HOSPITAL_COMMUNITY)

## 2024-05-01 DIAGNOSIS — R7989 Other specified abnormal findings of blood chemistry: Secondary | ICD-10-CM

## 2024-05-01 DIAGNOSIS — D649 Anemia, unspecified: Secondary | ICD-10-CM | POA: Diagnosis not present

## 2024-05-01 DIAGNOSIS — R079 Chest pain, unspecified: Secondary | ICD-10-CM

## 2024-05-01 LAB — GLUCOSE, CAPILLARY
Glucose-Capillary: 145 mg/dL — ABNORMAL HIGH (ref 70–99)
Glucose-Capillary: 158 mg/dL — ABNORMAL HIGH (ref 70–99)
Glucose-Capillary: 96 mg/dL (ref 70–99)

## 2024-05-01 LAB — ECHOCARDIOGRAM COMPLETE
AR max vel: 1.36 cm2
AV Peak grad: 29.4 mmHg
Ao pk vel: 2.71 m/s
Calc EF: 61.8 %
Height: 68 in
MV M vel: 4.83 m/s
MV Peak grad: 93.3 mmHg
MV VTI: 2.41 cm2
Radius: 0.4 cm
S' Lateral: 2.6 cm
Single Plane A2C EF: 59.3 %
Single Plane A4C EF: 65 %
Weight: 2752 [oz_av]

## 2024-05-01 LAB — HEMOGLOBIN AND HEMATOCRIT, BLOOD
HCT: 27.7 % — ABNORMAL LOW (ref 39.0–52.0)
Hemoglobin: 8 g/dL — ABNORMAL LOW (ref 13.0–17.0)

## 2024-05-01 LAB — TROPONIN T, HIGH SENSITIVITY: Troponin T High Sensitivity: 125 ng/L (ref 0–19)

## 2024-05-01 MED ORDER — PERFLUTREN LIPID MICROSPHERE
1.0000 mL | INTRAVENOUS | Status: AC | PRN
  Administered 2024-05-01: 2 mL via INTRAVENOUS

## 2024-05-01 MED ORDER — ALPRAZOLAM 0.5 MG PO TABS
0.5000 mg | ORAL_TABLET | Freq: Three times a day (TID) | ORAL | Status: DC | PRN
  Administered 2024-05-01: 0.5 mg via ORAL
  Filled 2024-05-01: qty 1

## 2024-05-01 MED ORDER — LIVING WELL WITH DIABETES BOOK
Freq: Once | Status: AC
  Filled 2024-05-01: qty 1

## 2024-05-01 NOTE — Brief Op Note (Signed)
 04/30/2024 11:15 AM  2:41 PM  PATIENT:  Raymond Munoz.  74 y.o. male  PRE-OPERATIVE DIAGNOSIS:  Symptomatic anemia, heme positive stool  POST-OPERATIVE DIAGNOSIS:  givens capsule deployment  PROCEDURE:  Procedures: EGD (ESOPHAGOGASTRODUODENOSCOPY) (N/A) IMAGING PROCEDURE, GI TRACT, INTRALUMINAL, VIA CAPSULE (N/A)  SURGEON:  Surgeons and Role:    * Yardley Beltran, MD - Primary  Findings ------------ Capsule endoscopy negative for any active bleeding.  Layla Lah MD, FACP 05/01/2024, 2:42 PM  Contact #  [phone]

## 2024-05-01 NOTE — Progress Notes (Signed)
 [facility] Progress Note  Raymond Munoz. 74 y.o. 1950/11/27  CC: Symptomatic anemia   Subjective: Patient seen and examined at bedside.  Denies any acute issues overnight.  Wants to go home.  ROS : Afebrile, negative for chest pain   Objective: Vital signs in last 24 hours: Vitals:   05/01/24 0609 05/01/24 1219  BP: (!) 159/82 (!) 158/78  Pulse: 91 76  Resp: 12 16  Temp: 98.6 F (37 C) 98.7 F (37.1 C)  SpO2: 98% 97%    Physical Exam: Sitting comfortably in the bed, not in acute distress, abdominal exam benign.    Lab Results: Recent Labs    04/29/24 1510 04/30/24 0518  NA 138 140  K 4.0 4.2  CL 102 104  CO2 25 26  GLUCOSE 168* 224*  BUN 24* 19  CREATININE 1.66* 1.23  CALCIUM  9.8 10.0   No results for input(s): AST, ALT, ALKPHOS, BILITOT, PROT, ALBUMIN in the last 72 hours. Recent Labs    04/29/24 1510 04/30/24 0518 05/01/24 0850  WBC 9.8 9.0  --   HGB 5.3* 8.0* 8.0*  HCT 19.5* 27.7* 27.7*  MCV 72.8* 77.6*  --   PLT 301 268  --    Recent Labs    04/29/24 1626  LABPROT 14.5  INR 1.1      Assessment/Plan: -Symptomatic anemia with heme positive stool.  No overt bleeding.  Hemoglobin 5.3 on admission. -Blood loss anemia.  Status post blood transfusion. - Mild elevated troponin.  No chest pain.  Likely related to severe anemia  Recommendations ------------------------- - EGD negative for any active bleeding.  Capsule endoscopy also negative for any active bleeding.  Colonoscopy last year was also negative for any active bleeding. Patient does not want any further inpatient workup at this time and prefers to go home. -Okay to discharge from GI standpoint. -Recommend outpatient hematology consult for evaluation of recurrent anemia - GI will sign off.  Call us  back if needed.    Layla Lah MD, FACP 05/01/2024, 2:42 PM  Contact #  [phone]

## 2024-05-01 NOTE — Discharge Summary (Signed)
 Physician Discharge Summary  Raymond Munoz. FMW:[id number] DOB: Nov 09, 1950 DOA: 04/29/2024  PCP: Leigh Lung, MD  Admit date: 04/29/2024  Discharge date: 05/01/2024  Admitted From: Home  Disposition:  Home  Recommendations for Outpatient Follow-up:  Follow up with PCP in 1-2 weeks Please obtain BMP/CBC in one week Advised to continue current medications as prescribed. Advised to follow up with Cardiologist as scheduled.  Home Health:None Equipment/Devices:None  Discharge Condition: Stable CODE STATUS:DNR Diet recommendation: Heart Healthy  Brief Summary / Hospital course: This 74 y.o. male with medical history significant for hypertension, insulin -dependent type 2 diabetes, GERD, hyperlipidemia and prior history of blood loss anemia being admitted to the hospital with 2 weeks of dizziness, generalized weakness, lack of appetite and found to have severe symptomatic anemia with evidence of blood loss. He denies any significant abdominal pain, he takes baby aspirin but no other medicines.  He has not had any dark stools or frank bleeding. He has had poor appetite for the last couple of weeks.  Patient finally decided to come to the ED for evaluation and he is found to have hemoglobin 5.3.  FOBT was positive. Patient was admitted for further evaluation and GI is consulted.  Patient underwent EGD with capsule enteroscopy yesterday.  Results showed no evidence of active bleeding. Hemoglobin Improved after 2 units PRBC, Cardiology consulted due to elevated troponin, Patient denies any chest pain, Likely demand ischemia. Echo completed , LVEF 60-65%, Right ventricle severely dilated. Patient feels better and wants to be discharged home, Patient needs to follow up with Cardiology outpatient.  Discharge Diagnoses:  Principal Problem:   Symptomatic anemia  Symptomatic anemia: Patient presented with generalized weakness, dizziness, fatigue, lack of appetite,  found to have hemoglobin 5.3. No  evidence of active bleeding, but Hemoccult positive.   Last year in January he presented with similar symptoms with normal bowel movements. He had upper and lower endoscopy without source of bleeding found.   He resumed his daily aspirin thereafter, and did not have any further workup. Avoid blood thinners, Hold aspirin Status post 2 unit PRBC,  hemoglobin improved to 8.0. GI consulted Dr. Elicia has completed capsule enteroscopy yesterday. Capsule study negative for GI bleeding.  H&H remains stable. GI signed off.      Insulin -dependent type 2 diabetes: Not very well-controlled, last hemoglobin A1c 8.4. Last year, patient takes Lantus  40 to 70 units nightly depending on blood sugars. Hb A1c 9.1 Continue Lantus  30 units nightly. Continue Moderate dose sliding scale. Resume home medications.   Elevated troponin: Patient without chest pain or EKG changes, I suspect this is demand ischemia from the degree of his anemia. Troponin trend 106 > 128 > 106 > 102 > 125 Monitor on telemetry. Discussed with cardiology,  recommended echocardiogram if abnormal,  consider formal consult. Echo completed , LVEF 60-65%, Right ventricle severely dilated.  Patient feels better and wants to be discharged home,  Patient needs to follow up with Cardiology outpatient.   Hyperlipidemia: Continue Lipitor.   Essential Hypertension: Patient and his wife are unsure what medications he takes at home, denies taking amlodipine and hydralazine  which are on the most recent medication list.   -IV hydralazine  as needed in the meantime. Continue hydralazine  25 mg every 8 hours.   GERD: Continue Protonix .  Discharge Instructions  Discharge Instructions     Call MD for:  difficulty breathing, headache or visual disturbances   Complete by: As directed    Call MD for:  persistant dizziness or light-headedness  Complete by: As directed    Call MD for:  persistant nausea and vomiting   Complete by: As  directed    Diet Carb Modified   Complete by: As directed    Discharge instructions   Complete by: As directed    Advised to follow up with PCP in one week Continue Omeprazole  40 mg daily.   Increase activity slowly   Complete by: As directed       Allergies as of 05/01/2024       Reactions   Codeine Other (See Comments)   Made the patient feel wide awake   Ibuprofen Other (See Comments)   GI upset   Aspirin Nausea Only, Other (See Comments)   Irritates stomach-can take 81 mg        Medication List     TAKE these medications    albuterol  108 (90 Base) MCG/ACT inhaler Commonly known as: VENTOLIN  HFA Inhale 1-2 puffs into the lungs every 6 (six) hours as needed for wheezing or shortness of breath.   amLODipine-benazepril 5-40 MG capsule Commonly known as: LOTREL Take 1 capsule by mouth daily.   ascorbic acid 500 MG tablet Commonly known as: VITAMIN C Take 500 mg by mouth daily.   aspirin 81 MG chewable tablet Chew 81 mg by mouth at bedtime.   atorvastatin  20 MG tablet Commonly known as: LIPITOR Take 20 mg by mouth daily.   dorzolamide-timolol 2-0.5 % ophthalmic solution Commonly known as: COSOPT Place 1 drop into both eyes in the morning and at bedtime.   fexofenadine 180 MG tablet Commonly known as: ALLEGRA Take 180 mg by mouth daily as needed for allergies or rhinitis.   freestyle lancets Use as instructed   furosemide  20 MG tablet Commonly known as: LASIX  Take 2 tablets (40 mg total) by mouth daily. What changed:  how much to take when to take this reasons to take this   gabapentin  300 MG capsule Commonly known as: NEURONTIN  Take 600 mg by mouth in the morning and at bedtime.   gemfibrozil  600 MG tablet Commonly known as: LOPID  Take 600 mg by mouth 2 (two) times daily before a meal.   glucose blood test strip Commonly known as: FREESTYLE LITE Use as instructed   hydrALAZINE  25 MG tablet Commonly known as: APRESOLINE  Take 25 mg by  mouth at bedtime.   ketorolac  0.5 % ophthalmic solution Commonly known as: ACULAR  Place 1 drop into the right eye daily.   Lantus  SoloStar 100 UNIT/ML Solostar Pen Generic drug: insulin  glargine Inject 40 Units into the skin at bedtime.   metFORMIN  1000 MG tablet Commonly known as: GLUCOPHAGE  Take 1 tablet (1,000 mg total) by mouth daily with breakfast.   Omega 3 1200 MG Caps Take 1,200 mg by mouth 2 (two) times daily.   omeprazole  20 MG tablet Commonly known as: PRILOSEC  OTC Take 2 tablets (40 mg total) by mouth daily. What changed:  how much to take when to take this   Refresh Tears 0.5 % Soln Generic drug: carboxymethylcellulose Place 1 drop into both eyes 2 (two) times daily.   timolol 0.5 % ophthalmic solution Commonly known as: BETIMOL Place 1 drop into both eyes in the morning and at bedtime.   Tylenol  8 Hour 650 MG CR tablet Generic drug: acetaminophen  Take 650 mg by mouth every 8 (eight) hours as needed for pain.        Follow-up Information     Leigh Lung, MD Follow up in 1 week(s).  Specialty: Family Medicine Contact information: [address] [place] [address] [phone]         Ren Donley Joelle VEAR, MD Follow up in 1 week(s).   Specialty: Cardiology Contact information: [address] [place] [address] [phone]                Allergies[1]  Consultations: Gastroenterology   Procedures/Studies: ECHOCARDIOGRAM COMPLETE Result Date: 05/01/2024    ECHOCARDIOGRAM REPORT   Patient Name:   Benoit Meech. Date of Exam: 05/01/2024 Medical Rec #:  [id number]           Height:       68.0 in Accession #:    [id number]          Weight:       172.0 lb Date of Birth:  1950-09-09           BSA:          1.917 m Patient Age:    73 years            BP:           158/78 mmHg Patient Gender: M                   HR:           87 bpm. Exam Location:  Inpatient Procedure: 2D Echo, Cardiac Doppler, Color Doppler  and Intracardiac            Opacification Agent (Both Spectral and Color Flow Doppler were            utilized during procedure). Indications:    R07.9* Chest pain, unspecified. Elevated troponin.  History:        Patient has prior history of Echocardiogram examinations, most                 recent 10/17/2023. Signs/Symptoms:Dizziness/Lightheadedness; Risk                 Factors:Hypertension, Dyslipidemia and Diabetes.  Sonographer:    Ellouise Mose RDCS Referring Phys: [id number] [facility]  Sonographer Comments: Technically difficult study due to poor echo windows. Patient appears to be in an abnormal rhythm during study. IMPRESSIONS  1. Left ventricular ejection fraction, by estimation, is 60 to 65%. Left ventricular ejection fraction by 2D MOD biplane is 61.8 %. The left ventricle has normal function. Left ventricular endocardial border not optimally defined to evaluate regional wall motion. There is mild asymmetric left ventricular hypertrophy of the basal-septal segment. Left ventricular diastolic parameters are indeterminate.  2. Right ventricular systolic function is normal. The right ventricular size is severely enlarged. There is mildly elevated pulmonary artery systolic pressure. The estimated right ventricular systolic pressure is 36.3 mmHg.  3. The mitral valve is degenerative. Mild mitral valve regurgitation. Mild mitral stenosis. The mean mitral valve gradient is 4.0 mmHg. Moderate mitral annular calcification.  4. The aortic valve is tricuspid. Aortic valve regurgitation is not visualized. Aortic valve sclerosis is present, with no evidence of aortic valve stenos  5. The inferior vena cava is normal in size with <50% respiratory variability, suggesting right atrial pressure of 8 mmHg. Comparison(s): Prior images reviewed side by side. Changes from prior study are noted. RV not well visualized during prior evaluation, but appears severely enlarged now. INGS  Left Ventricle: Left ventricular ejection  fraction, by estimation, is 60 to 65%. Left ventricular ejection fraction by 2D MOD biplane is 61.8 %. The left ventricle  has normal function. Left ventricular endocardial border not optimally defined to evaluate regional wall motion. Definity  contrast agent was given IV to delineate the left ventricular endocardial borders. The left ventricular internal cavity size was normal in size. There is mild asymmetric left ventricular hypertrophy of the basal-septal segment. Left ventricular diastolic parameters are indeterminate. Right Ventricle: The right ventricular size is severely enlarged. No increase in right ventricular wall thickness. Right ventricular systolic function is normal. There is mildly elevated pulmonary artery systolic pressure. The tricuspid regurgitant velocity is 2.66 m/s, and with an assumed right atrial pressure of 8 mmHg, the estimated right ventricular systolic pressure is 36.3 mmHg. Left Atrium: Left atrial size was normal in size. Right Atrium: Right atrial size was normal in size. Pericardium: There is no evidence of pericardial effusion. Mitral Valve: The mitral valve is degenerative in appearance. Moderate mitral annular calcification. Mild mitral valve regurgitation. Mild mitral valve stenosis. MV peak gradient, 9.4 mmHg. The mean mitral valve gradient is 4.0 mmHg. Tricuspid Valve: The tricuspid valve is normal in structure. Tricuspid valve regurgitation is not demonstrated. No evidence of tricuspid stenosis. Aortic Valve: The aortic valve is tricuspid. Aortic valve regurgitation is not visualized. Aortic valve sclerosis is present, with no evidence of aortic valve stenosis. Aortic valve peak gradient measures 29.4 mmHg. Pulmonic Valve: The pulmonic valve was not well visualized. Pulmonic valve regurgitation is not visualized. No evidence of pulmonic stenosis. Aorta: The aortic root and ascending aorta are structurally normal, with no evidence of dilitation. Venous: A systolic blunting flow  pattern is recorded from the right upper pulmonary vein. The inferior vena cava is normal in size with less than 50% respiratory variability, suggesting right atrial pressure of 8 mmHg. IAS/Shunts: No atrial level shunt detected by color flow Doppler.  LEFT VENTRICLE PLAX 2D                        Biplane EF (MOD) LVIDd:         3.60 cm         LV Biplane EF:   Left LVIDs:         2.60 cm                          ventricular LV PW:         1.10 cm                          ejection LV IVS:        1.20 cm                          fraction by LVOT diam:     2.20 cm                          2D MOD LV SV:         61                               biplane is LV SV Index:   32                               61.8 %. LVOT Area:     3.80 cm  LV Volumes (  MOD) LV vol d, MOD    104.0 ml A2C: LV vol d, MOD    88.3 ml A4C: LV vol s, MOD    42.3 ml A2C: LV vol s, MOD    30.9 ml A4C: LV SV MOD A2C:   61.7 ml LV SV MOD A4C:   88.3 ml LV SV MOD BP:    61.6 ml RIGHT VENTRICLE             IVC RV S prime:     12.50 cm/s  IVC diam: 1.90 cm TAPSE (M-mode): 2.1 cm                             PULMONARY VEINS                             Diastolic Velocity: 54.40 cm/s                             S/D Velocity:       0.90                             Systolic Velocity:  46.70 cm/s LEFT ATRIUM             Index        RIGHT ATRIUM           Index LA diam:        4.20 cm 2.19 cm/m   RA Area:     13.20 cm LA Vol (A2C):   39.7 ml 20.71 ml/m  RA Volume:   30.70 ml  16.02 ml/m LA Vol (A4C):   43.9 ml 22.90 ml/m LA Biplane Vol: 42.7 ml 22.28 ml/m  AORTIC VALVE AV Area (Vmax): 1.36 cm AV Vmax:        271.00 cm/s AV Peak Grad:   29.4 mmHg LVOT Vmax:      97.30 cm/s LVOT Vmean:     62.033 cm/s LVOT VTI:       0.162 m  AORTA Ao Root diam: 3.60 cm Ao Asc diam:  2.90 cm MITRAL VALVE                  TRICUSPID VALVE MV Area VTI:  2.41 cm        TR Peak grad:   28.3 mmHg MV Peak grad: 9.4 mmHg        TR Vmax:        266.00 cm/s MV Mean grad: 4.0 mmHg MV  Vmax:      1.53 m/s        SHUNTS MV Vmean:     94.8 cm/s       Systemic VTI:  0.16 m MR Peak grad:    93.3 mmHg    Systemic Diam: 2.20 cm MR Mean grad:    67.0 mmHg MR Vmax:         483.00 cm/s MR Vmean:        390.0 cm/s MR PISA:         1.01 cm MR PISA Eff ROA: 8 mm MR PISA Radius:  0.40 cm Raymond Munoz Electronically signed by Joelle Cedars Munoz Signature Date/Time: 05/01/2024/6:08:34 PM    Final    DG Chest 2 View Result Date: 04/29/2024 CLINICAL DATA:  Cough, shortness of breath  EXAM: CHEST - 2 VIEW COMPARISON:  Prior CT scan of the chest 05/04/2023; prior chest x-ray 12/07/2023 FINDINGS: Cardiac and mediastinal contours are unchanged. Chronic bronchitic changes are similar compared to prior. No focal airspace infiltrate, pleural effusion, pneumothorax or pulmonary edema. No acute osseous abnormality. IMPRESSION: Stable chest x-ray without evidence of acute cardiopulmonary process. Electronically Signed   By: Wilkie Lent M.D.   On: 04/29/2024 15:24   EGD   Subjective: Patient was seen and examined at bed side, He reports feeling much better and wants to be discharged home.  Discharge Exam: Vitals:   05/01/24 0609 05/01/24 1219  BP: (!) 159/82 (!) 158/78  Pulse: 91 76  Resp: 12 16  Temp: 98.6 F (37 C) 98.7 F (37.1 C)  SpO2: 98% 97%   Vitals:   05/01/24 0405 05/01/24 0559 05/01/24 0609 05/01/24 1219  BP: (!) 162/75 (!) 162/75 (!) 159/82 (!) 158/78  Pulse: 94  91 76  Resp: (!) 25  12 16   Temp: 97.8 F (36.6 C)  98.6 F (37 C) 98.7 F (37.1 C)  TempSrc:   Oral Oral  SpO2: 98%  98% 97%  Weight:      Height:        General: Pt is alert, awake, not in acute distress Cardiovascular: RRR, S1/S2 +, no rubs, no gallops Respiratory: CTA bilaterally, no wheezing, no rhonchi Abdominal: Soft, NT, ND, bowel sounds + Extremities: no edema, no cyanosis    The results of significant diagnostics from this hospitalization (including imaging, microbiology, ancillary  and laboratory) are listed below for reference.     Microbiology: No results found for this or any previous visit (from the past 240 hours).   Labs: BNP (last 3 results) Recent Labs    05/04/23 1142  BNP 72.8   Basic Metabolic Panel: Recent Labs  Lab 04/29/24 1510 04/30/24 0518  NA 138 140  K 4.0 4.2  CL 102 104  CO2 25 26  GLUCOSE 168* 224*  BUN 24* 19  CREATININE 1.66* 1.23  CALCIUM  9.8 10.0   Liver Function Tests: No results for input(s): AST, ALT, ALKPHOS, BILITOT, PROT, ALBUMIN in the last 168 hours. No results for input(s): LIPASE, AMYLASE in the last 168 hours. No results for input(s): AMMONIA in the last 168 hours. CBC: Recent Labs  Lab 04/29/24 1510 04/30/24 0518 05/01/24 0850  WBC 9.8 9.0  --   HGB 5.3* 8.0* 8.0*  HCT 19.5* 27.7* 27.7*  MCV 72.8* 77.6*  --   PLT 301 268  --    Cardiac Enzymes: No results for input(s): CKTOTAL, CKMB, CKMBINDEX, TROPONINI in the last 168 hours. BNP: Invalid input(s): POCBNP CBG: Recent Labs  Lab 04/30/24 1612 04/30/24 2112 05/01/24 0732 05/01/24 1218 05/01/24 1613  GLUCAP 161* 179* 145* 158* 96   D-Dimer No results for input(s): DDIMER in the last 72 hours. Hgb A1c Recent Labs    04/30/24 0518  HGBA1C 9.1*   Lipid Profile No results for input(s): CHOL, HDL, LDLCALC, TRIG, CHOLHDL, LDLDIRECT in the last 72 hours. Thyroid  function studies No results for input(s): TSH, T4TOTAL, T3FREE, THYROIDAB in the last 72 hours.  Invalid input(s): FREET3 Anemia work up No results for input(s): VITAMINB12, FOLATE, FERRITIN, TIBC, IRON, RETICCTPCT in the last 72 hours. Urinalysis    Component Value Date/Time   COLORURINE YELLOW 02/08/2015 1818   APPEARANCEUR CLEAR 02/08/2015 1818   LABSPEC 1.036 (H) 02/08/2015 1818   PHURINE 5.5 02/08/2015 1818   GLUCOSEU >1000 (A) 02/08/2015 1818  HGBUR NEGATIVE 02/08/2015 1818   BILIRUBINUR NEGATIVE 02/08/2015  1818   KETONESUR NEGATIVE 02/08/2015 1818   PROTEINUR NEGATIVE 02/08/2015 1818   UROBILINOGEN 0.2 02/08/2015 1818   NITRITE NEGATIVE 02/08/2015 1818   LEUKOCYTESUR NEGATIVE 02/08/2015 1818   Sepsis Labs Recent Labs  Lab 04/29/24 1510 04/30/24 0518  WBC 9.8 9.0   Microbiology No results found for this or any previous visit (from the past 240 hours).   Time coordinating discharge: Over 30 minutes  SIGNED:   Darcel Dawley, MD  [facility] 05/01/2024, 6:47 PM Pager   If 7PM-7AM, please contact night-coverage     [1]  Allergies Allergen Reactions   Codeine Other (See Comments)    Made the patient feel wide awake   Ibuprofen Other (See Comments)    GI upset   Aspirin Nausea Only and Other (See Comments)    Irritates stomach-can take 81 mg

## 2024-05-01 NOTE — Progress Notes (Signed)
" °  Echocardiogram 2D Echocardiogram has been performed.  Raymond Munoz 05/01/2024, 3:44 PM "

## 2024-05-01 NOTE — Discharge Instructions (Signed)
 Advised to follow-up with primary care physician in 1 week.

## 2024-05-01 NOTE — Inpatient Diabetes Management (Signed)
 Inpatient Diabetes Program Recommendations  AACE/ADA: New Consensus Statement on Inpatient Glycemic Control (2015)  Target Ranges:  Prepandial:   less than 140 mg/dL      Peak postprandial:   less than 180 mg/dL (1-2 hours)      Critically ill patients:  140 - 180 mg/dL   Lab Results  Component Value Date   GLUCAP 158 (H) 05/01/2024   HGBA1C 9.1 (H) 04/30/2024    Review of Glycemic Control  Latest Reference Range & Units 04/30/24 21:12 05/01/24 07:32 05/01/24 12:18  Glucose-Capillary 70 - 99 mg/dL 820 (H) 854 (H) 841 (H)  (H): Data is abnormally high  Diabetes history: DM2 Outpatient Diabetes medications: Lantus  40 units every day, Metformin  1000 mg BID Current orders for Inpatient glycemic control: Lantus  30 units every day, Novolog  0-15 units TID  Met with patient and spouse at bedside.  Reviewed patient's current A1c of 9.1%. Explained what a A1c is and what it measures. Also reviewed goal A1c with patient, importance of good glucose control @ home, and blood sugar goals.  He does admit to drinking sugary beverages such as regular sodas.  Checks CBG with glucometer.  Does not skip DM medications.  Denies having low blood sugars.  He is aware of S&S and treatment.  He states he will consume diet sodas if he wants a soda.  Reviewed CHO's and being mindful of potion sizes.  Ordered LWWDM booklet.    Thank you, Wyvonna Pinal, MSN, CDCES Diabetes Coordinator Inpatient Diabetes Program [phone] (team pager from 8a-5p)

## 2024-05-01 NOTE — Progress Notes (Signed)
 " PROGRESS NOTE    Raymond Munoz.  FMW:[id number] DOB: 07/18/1950 DOA: 04/29/2024  PCP: Leigh Lung, MD   Brief Narrative: This 74 y.o. male with medical history significant for hypertension, insulin -dependent type 2 diabetes, GERD, hyperlipidemia and prior history of blood loss anemia being admitted to the hospital with 2 weeks of dizziness, generalized weakness, lack of appetite and found to have severe symptomatic anemia with evidence of blood loss. He denies any significant abdominal pain, he takes baby aspirin but no other medicines.  He has not had any dark stools or frank bleeding. He has had poor appetite for the last couple of weeks.  Patient finally decided to come to the ED for evaluation and he is found to have hemoglobin 5.3.  FOBT was positive. Patient was admitted for further evaluation and GI is consulted.  Patient underwent EGD with capsule enteroscopy yesterday.  Assessment & Plan:   Principal Problem:   Symptomatic anemia  Symptomatic anemia: Patient presented with generalized weakness, dizziness, fatigue, lack of appetite,  found to have hemoglobin 5.3. No evidence of active bleeding, but Hemoccult positive.   Last year in January he presented with similar symptoms with normal bowel movements. He had upper and lower endoscopy without source of bleeding found.   He resumed his daily aspirin thereafter, and did not have any further workup. Avoid blood thinners, Hold aspirin Status post 2 unit PRBC,  hemoglobin improved to 8.0. GI consulted Dr. Elicia has completed capsule enteroscopy yesterday. Capsule study to be read today.  H&H remains stable.    Insulin -dependent type 2 diabetes: Not very well-controlled, last hemoglobin A1c 8.4. Last year, patient takes Lantus  40 to 70 units nightly depending on blood sugars. Hb A1c 9.1 Continue Lantus  30 units nightly. Continue Moderate dose sliding scale   Elevated troponin: Patient without chest pain or EKG  changes, I suspect this is demand ischemia from the degree of his anemia. Troponin trend 106 > 128 > 106 > 102 > 125 Monitor on telemetry. Discussed with cardiology,  recommended echocardiogram if abnormal,  consider formal consult.   Hyperlipidemia: Continue Lipitor.   Essential Hypertension: Patient and his wife are unsure what medications he takes at home, denies taking amlodipine and hydralazine  which are on the most recent medication list.   -IV hydralazine  as needed in the meantime. Continue hydralazine  25 mg every 8 hours.   GERD: Continue IV Protonix .   DVT prophylaxis: SCDs Code Status: DNR Family Communication: Spouse at bed side. Disposition Plan:   Status is: Observation The patient remains OBS appropriate and will d/c before 2 midnights.    Patient admitted for symptomatic anemia,  found to have FOBT positive.  Status post 2 unit PRBC.  Hemoglobin improved to 8.0  Patient underwent EGD yesterday,  capsule enteroscopy to be read today.  Patient is not medically ready for discharge yet.  Consultants:  Gastroenterology  Procedures: EGD today  Antimicrobials:  Anti-infectives (From admission, onward)    None      Subjective: Patient was seen and examined at bedside. Overnight events noted. Patient reports feeling better after getting 2 units of blood transfusion.  Hemoglobin has improved to 8.0. He underwent capsule enteroscopy yesterday.  Objective: Vitals:   05/01/24 0405 05/01/24 0559 05/01/24 0609 05/01/24 1219  BP: (!) 162/75 (!) 162/75 (!) 159/82 (!) 158/78  Pulse: 94  91 76  Resp: (!) 25  12 16   Temp: 97.8 F (36.6 C)  98.6 F (37 C) 98.7 F (37.1 C)  TempSrc:   Oral Oral  SpO2: 98%  98% 97%  Weight:      Height:        Intake/Output Summary (Last 24 hours) at 05/01/2024 1335 Last data filed at 04/30/2024 2200 Gross per 24 hour  Intake 120 ml  Output --  Net 120 ml   Filed Weights   04/30/24 1052 04/30/24 1600  Weight: 84.4 kg 78  kg    Examination:  General exam: Appears calm and comfortable, not in any acute distress. Respiratory system: CTA Bilaterally. Respiratory effort normal.  RR 14 Cardiovascular system: S1 & S2 heard, RRR. No JVD, murmurs, rubs, gallops or clicks.  Gastrointestinal system: Abdomen is non distended, soft and non tender. Normal bowel sounds heard. Central nervous system: Alert and oriented x 3. No focal neurological deficits. Extremities: No edema, No cyanosis, No clubbing  Skin: No rashes, lesions or ulcers Psychiatry: Judgement and insight appear normal. Mood & affect appropriate.   Data Reviewed: I have personally reviewed following labs and imaging studies  CBC: Recent Labs  Lab 04/29/24 1510 04/30/24 0518 05/01/24 0850  WBC 9.8 9.0  --   HGB 5.3* 8.0* 8.0*  HCT 19.5* 27.7* 27.7*  MCV 72.8* 77.6*  --   PLT 301 268  --    Basic Metabolic Panel: Recent Labs  Lab 04/29/24 1510 04/30/24 0518  NA 138 140  K 4.0 4.2  CL 102 104  CO2 25 26  GLUCOSE 168* 224*  BUN 24* 19  CREATININE 1.66* 1.23  CALCIUM  9.8 10.0   GFR: Estimated Creatinine Clearance: 51.7 mL/min (by C-G formula based on SCr of 1.23 mg/dL). Liver Function Tests: No results for input(s): AST, ALT, ALKPHOS, BILITOT, PROT, ALBUMIN in the last 168 hours. No results for input(s): LIPASE, AMYLASE in the last 168 hours. No results for input(s): AMMONIA in the last 168 hours. Coagulation Profile: Recent Labs  Lab 04/29/24 1626  INR 1.1   Cardiac Enzymes: No results for input(s): CKTOTAL, CKMB, CKMBINDEX, TROPONINI in the last 168 hours. BNP (last 3 results) Recent Labs    12/07/23 2000 04/29/24 1626  PROBNP 669.0* 854.0*   HbA1C: Recent Labs    04/30/24 0518  HGBA1C 9.1*   CBG: Recent Labs  Lab 04/30/24 1440 04/30/24 1612 04/30/24 2112 05/01/24 0732 05/01/24 1218  GLUCAP 158* 161* 179* 145* 158*   Lipid Profile: No results for input(s): CHOL, HDL, LDLCALC,  TRIG, CHOLHDL, LDLDIRECT in the last 72 hours. Thyroid  Function Tests: No results for input(s): TSH, T4TOTAL, FREET4, T3FREE, THYROIDAB in the last 72 hours. Anemia Panel: No results for input(s): VITAMINB12, FOLATE, FERRITIN, TIBC, IRON, RETICCTPCT in the last 72 hours. Sepsis Labs: No results for input(s): PROCALCITON, LATICACIDVEN in the last 168 hours.  No results found for this or any previous visit (from the past 240 hours).   Radiology Studies: DG Chest 2 View Result Date: 04/29/2024 CLINICAL DATA:  Cough, shortness of breath EXAM: CHEST - 2 VIEW COMPARISON:  Prior CT scan of the chest 05/04/2023; prior chest x-ray 12/07/2023 FINDINGS: Cardiac and mediastinal contours are unchanged. Chronic bronchitic changes are similar compared to prior. No focal airspace infiltrate, pleural effusion, pneumothorax or pulmonary edema. No acute osseous abnormality. IMPRESSION: Stable chest x-ray without evidence of acute cardiopulmonary process. Electronically Signed   By: Wilkie Lent M.D.   On: 04/29/2024 15:24   Scheduled Meds:  sodium chloride    Intravenous Once   sodium chloride    Intravenous Once   hydrALAZINE   25 mg Oral Q8H  insulin  aspart  0-15 Units Subcutaneous TID WC   insulin  glargine-yfgn  30 Units Subcutaneous QHS   pantoprazole  (PROTONIX ) IV  40 mg Intravenous Q24H   Continuous Infusions:   LOS: 0 days   Time spent: 50 MINS   Darcel Dawley, MD [facility]   If 7PM-7AM, please contact night-coverage  "

## 2024-05-02 ENCOUNTER — Encounter (HOSPITAL_COMMUNITY): Payer: Self-pay | Admitting: Gastroenterology

## 2024-05-04 ENCOUNTER — Encounter (INDEPENDENT_AMBULATORY_CARE_PROVIDER_SITE_OTHER): Admitting: Ophthalmology

## 2024-05-04 DIAGNOSIS — Z7984 Long term (current) use of oral hypoglycemic drugs: Secondary | ICD-10-CM

## 2024-05-04 DIAGNOSIS — H40053 Ocular hypertension, bilateral: Secondary | ICD-10-CM

## 2024-05-04 DIAGNOSIS — Z961 Presence of intraocular lens: Secondary | ICD-10-CM

## 2024-05-04 DIAGNOSIS — Z794 Long term (current) use of insulin: Secondary | ICD-10-CM

## 2024-05-04 DIAGNOSIS — H401130 Primary open-angle glaucoma, bilateral, stage unspecified: Secondary | ICD-10-CM

## 2024-05-04 DIAGNOSIS — E113313 Type 2 diabetes mellitus with moderate nonproliferative diabetic retinopathy with macular edema, bilateral: Secondary | ICD-10-CM

## 2024-05-22 ENCOUNTER — Ambulatory Visit
